# Patient Record
Sex: Female | Born: 1985 | Hispanic: No | Marital: Single | Smoking: Former smoker
Health system: Southern US, Community
[De-identification: ages and names within clinical notes are randomized; demographics above are authoritative.]

## PROBLEM LIST (undated history)

## (undated) DIAGNOSIS — D649 Anemia, unspecified: Secondary | ICD-10-CM

## (undated) DIAGNOSIS — E079 Disorder of thyroid, unspecified: Secondary | ICD-10-CM

## (undated) HISTORY — DX: Disorder of thyroid, unspecified: E07.9

---

## 2008-12-07 HISTORY — PX: BACK SURGERY: SHX140

## 2009-12-07 HISTORY — PX: WRIST SURGERY: SHX841

## 2013-11-08 ENCOUNTER — Ambulatory Visit: Payer: Medicaid Other | Attending: Internal Medicine | Admitting: Internal Medicine

## 2013-11-08 ENCOUNTER — Encounter: Payer: Self-pay | Admitting: Internal Medicine

## 2013-11-08 ENCOUNTER — Other Ambulatory Visit: Payer: Self-pay | Admitting: Internal Medicine

## 2013-11-08 VITALS — BP 113/79 | HR 78 | Temp 98.2°F | Resp 17 | Wt 218.0 lb

## 2013-11-08 DIAGNOSIS — N63 Unspecified lump in unspecified breast: Secondary | ICD-10-CM | POA: Insufficient documentation

## 2013-11-08 DIAGNOSIS — Z23 Encounter for immunization: Secondary | ICD-10-CM

## 2013-11-08 DIAGNOSIS — Z87898 Personal history of other specified conditions: Secondary | ICD-10-CM | POA: Insufficient documentation

## 2013-11-08 DIAGNOSIS — E039 Hypothyroidism, unspecified: Secondary | ICD-10-CM | POA: Insufficient documentation

## 2013-11-08 DIAGNOSIS — Z139 Encounter for screening, unspecified: Secondary | ICD-10-CM | POA: Insufficient documentation

## 2013-11-08 NOTE — Progress Notes (Signed)
Patient here to establish care States was taking medication for her thyroid  But has not taken anything since 10/04/13 Does not know the name of the medication States she feels a lump in each breast Mother passed away 10 years ago from breast cancer Needs referral for [facility]

## 2013-11-08 NOTE — Progress Notes (Signed)
MRN: [id number] Name: Deborah White  Sex: female Age: 28 y.o. DOB: 01-04-1986  Allergies: Review of patient's allergies indicates no known allergies.  Chief Complaint  Patient presents with  . Establish Care    HPI: Patient is 27 y.o. female who comes today for the first time to establish medical care, she has history of hypothyroidism, was taking medication for that does not remember the dose, also reported to have bilateral breast lump for the last 5 years denies any pain or increased in size requesting to repeat an ultrasound, last ultrasound? Was done one year ago, she denies any change in weight or appetite denies any nausea vomiting or any other significant symptoms.  Past Medical History  Diagnosis Date  . Thyroid disease     History reviewed. No pertinent past surgical history.    Medication List    Notice As of 11/08/2013 11:30 AM   You have not been prescribed any medications.      No orders of the defined types were placed in this encounter.     There is no immunization history on file for this patient.  History  Substance Use Topics  . Smoking status: Never Smoker   . Smokeless tobacco: Not on file  . Alcohol Use: No    Review of Systems  As noted in HPI  Filed Vitals:   11/08/13 1050  BP: 113/79  Pulse: 78  Temp: 98.2 F (36.8 C)  Resp: 17    Physical Exam  Physical Exam  Constitutional: No distress.  Eyes: EOM are normal. Pupils are equal, round, and reactive to light.  Cardiovascular: Normal rate and regular rhythm.   Pulmonary/Chest: Breath sounds normal. She has no wheezes. She has no rales.  Breast examination done in the presence of medical staff as chaperone, left breast palpable lump nontender no nipple discharge    CBC No results found for this basename: wbc,  rbc,  hgb,  hct,  plt,  mcv,  neutrabs,  lymphsabs,  monoabs,  eosabs,  basosabs    CMP  No results found for this basename: na,  k,  cl,  co2,  glucose,  bun,   creatinine,  calcium,  prot,  albumin,  ast,  alt,  alkphos,  bilitot,  gfrnonaa,  gfraa    No results found for this basename: chol,  tri,  ldl    No components found with this basename: hga1c    No results found for this basename: AST    Assessment and Plan  History of breast lump - Plan: US Breast Bilateral  Unspecified hypothyroidism - Plan: Will check TSH level   Screening - Plan: CBC with Differential, COMPLETE METABOLIC PANEL WITH GFR, Lipid panel, Vit D  25 hydroxy (rtn osteoporosis monitoring)  flu shot given today    Return in about 3 weeks (around 11/29/2013).  Doris Cheadle, MD

## 2013-11-09 LAB — CBC WITH DIFFERENTIAL/PLATELET
Basophils Absolute: 0 10*3/uL (ref 0.0–0.1)
Basophils Relative: 0 % (ref 0–1)
Eosinophils Absolute: 0.1 10*3/uL (ref 0.0–0.7)
Eosinophils Relative: 1 % (ref 0–5)
HCT: 34.7 % — ABNORMAL LOW (ref 36.0–46.0)
Hemoglobin: 11 g/dL — ABNORMAL LOW (ref 12.0–15.0)
Lymphocytes Relative: 24 % (ref 12–46)
Lymphs Abs: 2.4 10*3/uL (ref 0.7–4.0)
MCH: 22.3 pg — ABNORMAL LOW (ref 26.0–34.0)
MCHC: 31.7 g/dL (ref 30.0–36.0)
MCV: 70.4 fL — ABNORMAL LOW (ref 78.0–100.0)
Monocytes Absolute: 0.7 10*3/uL (ref 0.1–1.0)
Monocytes Relative: 7 % (ref 3–12)
Neutro Abs: 6.8 10*3/uL (ref 1.7–7.7)
Neutrophils Relative %: 68 % (ref 43–77)
Platelets: 332 10*3/uL (ref 150–400)
RBC: 4.93 MIL/uL (ref 3.87–5.11)
RDW: 15.8 % — ABNORMAL HIGH (ref 11.5–15.5)
WBC: 10 10*3/uL (ref 4.0–10.5)

## 2013-11-09 LAB — COMPLETE METABOLIC PANEL WITH GFR
ALT: 11 U/L (ref 0–35)
AST: 10 U/L (ref 0–37)
Albumin: 4.6 g/dL (ref 3.5–5.2)
Alkaline Phosphatase: 85 U/L (ref 39–117)
BUN: 14 mg/dL (ref 6–23)
CO2: 25 mEq/L (ref 19–32)
Calcium: 9.8 mg/dL (ref 8.4–10.5)
Chloride: 105 mEq/L (ref 96–112)
Creat: 0.61 mg/dL (ref 0.50–1.10)
GFR, Est African American: >89 mL/min
GFR, Est Non African American: >89 mL/min
Glucose, Bld: 94 mg/dL (ref 70–99)
Potassium: 3.9 mEq/L (ref 3.5–5.3)
Sodium: 136 mEq/L (ref 135–145)
Total Bilirubin: 0.3 mg/dL (ref 0.3–1.2)
Total Protein: 7.3 g/dL (ref 6.0–8.3)

## 2013-11-09 LAB — LIPID PANEL
Cholesterol: 174 mg/dL (ref 0–200)
HDL: 33 mg/dL — ABNORMAL LOW (ref >39)
LDL Cholesterol: 111 mg/dL — ABNORMAL HIGH (ref 0–99)
Total CHOL/HDL Ratio: 5.3 Ratio
Triglycerides: 148 mg/dL (ref <150)
VLDL: 30 mg/dL (ref 0–40)

## 2013-11-09 LAB — TSH: TSH: 2.856 u[IU]/mL (ref 0.350–4.500)

## 2013-11-09 LAB — VITAMIN D 25 HYDROXY (VIT D DEFICIENCY, FRACTURES): Vit D, 25-Hydroxy: 17 ng/mL — ABNORMAL LOW (ref 30–89)

## 2013-11-10 ENCOUNTER — Ambulatory Visit
Admission: RE | Admit: 2013-11-10 | Discharge: 2013-11-10 | Disposition: A | Discharge status: Home / Self Care | Payer: Medicaid Other | Source: Ambulatory Visit | Attending: Internal Medicine | Admitting: Internal Medicine

## 2013-11-10 DIAGNOSIS — N63 Unspecified lump in unspecified breast: Secondary | ICD-10-CM

## 2013-11-15 ENCOUNTER — Telehealth: Payer: Self-pay | Admitting: Emergency Medicine

## 2013-11-15 MED ORDER — VITAMIN D (ERGOCALCIFEROL) 1.25 MG (50000 UNIT) PO CAPS: 50000.0000 [IU] | ORAL_CAPSULE | ORAL | Status: DC

## 2013-11-15 NOTE — Addendum Note (Signed)
Addended by: Nonnie Done D on: 11/15/2013 11:47 AM   Modules accepted: Orders

## 2013-11-15 NOTE — Telephone Encounter (Signed)
Pt given results and instructions with new scripts

## 2013-11-15 NOTE — Telephone Encounter (Signed)
Message copied by Darlis Loan on Wed Nov 15, 2013 11:08 AM ------      Message from: Doris Cheadle      Created: Tue Nov 14, 2013 10:28 AM       Blood work reviewed, noticed low vitamin D, call patient advise to start ergocalciferol 50,000 units once a week for the duration of  12 weeks.      Also patient has some anemia advise to start over-the-counter iron supplements ------

## 2013-11-16 ENCOUNTER — Telehealth: Payer: Self-pay | Admitting: *Deleted

## 2013-11-16 NOTE — Telephone Encounter (Signed)
Called the pt told her about her lab results. Vitamin D was low and anemia. I told her that the medication for the vitamin D was ready to pick up at our pharmacy. Also that for the anemia she needed to pick up OTC iron supplements.

## 2013-11-29 ENCOUNTER — Encounter: Payer: Self-pay | Admitting: Internal Medicine

## 2013-11-29 ENCOUNTER — Ambulatory Visit: Payer: Medicaid Other | Attending: Internal Medicine | Admitting: Internal Medicine

## 2013-11-29 VITALS — BP 122/78 | HR 68 | Temp 98.3°F | Resp 16

## 2013-11-29 DIAGNOSIS — D649 Anemia, unspecified: Secondary | ICD-10-CM | POA: Insufficient documentation

## 2013-11-29 DIAGNOSIS — E785 Hyperlipidemia, unspecified: Secondary | ICD-10-CM | POA: Insufficient documentation

## 2013-11-29 DIAGNOSIS — N63 Unspecified lump in unspecified breast: Secondary | ICD-10-CM

## 2013-11-29 DIAGNOSIS — E559 Vitamin D deficiency, unspecified: Secondary | ICD-10-CM | POA: Insufficient documentation

## 2013-11-29 DIAGNOSIS — J069 Acute upper respiratory infection, unspecified: Secondary | ICD-10-CM

## 2013-11-29 DIAGNOSIS — Z09 Encounter for follow-up examination after completed treatment for conditions other than malignant neoplasm: Secondary | ICD-10-CM

## 2013-11-29 MED ORDER — AZITHROMYCIN 250 MG PO TABS: ORAL_TABLET | ORAL | Status: DC

## 2013-11-29 MED ORDER — PSEUDOEPHEDRINE-GUAIFENESIN ER 120-1200 MG PO TB12: 1.0000 | ORAL_TABLET | Freq: Two times a day (BID) | ORAL | Status: DC | PRN

## 2013-11-29 NOTE — Progress Notes (Signed)
Patient here for follow up-thyroid disease Complains of congestion, headache fever Feels tired Having some post nasal drip as well

## 2013-11-29 NOTE — Progress Notes (Signed)
MRN: [id number] Name: Deborah White  Sex: female Age: 27 y.o. DOB: 11-10-1986  Allergies: Review of patient's allergies indicates no known allergies.  Chief Complaint  Patient presents with  . Follow-up    HPI: Patient is 27 y.o. female who comes today for followup, blood work was reviewed with the patient has vitamin D deficiency and she has started taking vitamin D supplement, borderline hyperlipidemia patient is already following low-fat diet, she also reported to have recently fever or headache sore throat nasal congestion, she took over-the-counter medication, fever is resolved but she still has persistent symptoms of congestion denies any more cough.  Past Medical History  Diagnosis Date  . Thyroid disease     History reviewed. No pertinent past surgical history.    Medication List       This list is accurate as of: 11/29/13 11:11 AM.  Always use your most recent med list.               azithromycin 250 MG tablet  Commonly known as:  ZITHROMAX Z-PAK  Take as directed     Pseudoephedrine-Guaifenesin [phone] MG Tb12  Take 1 tablet by mouth 2 (two) times daily as needed (congestion).     Vitamin D (Ergocalciferol) 50000 UNITS Caps capsule  Commonly known as:  DRISDOL  Take 1 capsule (50,000 Units total) by mouth every 7 (seven) days.        Meds ordered this encounter  Medications  . azithromycin (ZITHROMAX Z-PAK) 250 MG tablet    Sig: Take as directed    Dispense:  6 each    Refill:  0  . Pseudoephedrine-Guaifenesin [phone] MG TB12    Sig: Take 1 tablet by mouth 2 (two) times daily as needed (congestion).    Dispense:  30 each    Refill:  0    Immunization History  Administered Date(s) Administered  . Influenza,inj,Quad PF,36+ Mos 11/08/2013    Family History  Problem Relation Age of Onset  . Cancer Mother   . Cancer Maternal Uncle     brain cancer   . Heart disease Maternal Grandmother     History  Substance Use Topics  . Smoking  status: Never Smoker   . Smokeless tobacco: Not on file  . Alcohol Use: No    Review of Systems  As noted in HPI  Filed Vitals:   11/29/13 1033  BP: 122/78  Pulse: 68  Temp: 98.3 F (36.8 C)  Resp: 16    Physical Exam  Physical Exam  Constitutional: No distress.  HENT:  Nasal congestion minimal sinus tenderness,   Some pharyngeal erythema no exudate  Eyes: EOM are normal. Pupils are equal, round, and reactive to light.  Cardiovascular: Normal rate and regular rhythm.   Pulmonary/Chest: Breath sounds normal. No respiratory distress. She has no wheezes. She has no rales.  Lymphadenopathy:    She has cervical adenopathy.    CBC    Component Value Date/Time   WBC 10.0 11/08/2013 1136   RBC 4.93 11/08/2013 1136   HGB 11.0* 11/08/2013 1136   HCT 34.7* 11/08/2013 1136   PLT 332 11/08/2013 1136   MCV 70.4* 11/08/2013 1136   LYMPHSABS 2.4 11/08/2013 1136   MONOABS 0.7 11/08/2013 1136   EOSABS 0.1 11/08/2013 1136   BASOSABS 0.0 11/08/2013 1136    CMP     Component Value Date/Time   NA 136 11/08/2013 1136   K 3.9 11/08/2013 1136   CL 105 11/08/2013 1136   CO2 25 11/08/2013  1136   GLUCOSE 94 11/08/2013 1136   BUN 14 11/08/2013 1136   CREATININE 0.61 11/08/2013 1136   CALCIUM 9.8 11/08/2013 1136   PROT 7.3 11/08/2013 1136   ALBUMIN 4.6 11/08/2013 1136   AST 10 11/08/2013 1136   ALT 11 11/08/2013 1136   ALKPHOS 85 11/08/2013 1136   BILITOT 0.3 11/08/2013 1136    Lab Results  Component Value Date/Time   CHOL 174 11/08/2013 11:36 AM    No components found with this basename: hga1c    Lab Results  Component Value Date/Time   AST 10 11/08/2013 11:36 AM    Assessment and Plan  Follow up  Unspecified vitamin D deficiency ... continue with vitamin D  Anemia ... continue with over-the-counter iron pills  Other and unspecified hyperlipidemia ... low fat diet  Breast lump in female ... had an ultrasound done repeat recommended in 6 months.  URI (upper respiratory infection) -  Plan: Advised patient for saltwater gargles, prescribed azithromycin (ZITHROMAX Z-PAK) 250 MG tablet, Pseudoephedrine-Guaifenesin [phone] MG TB12   Return in about 3 months (around 02/27/2014).  Doris Cheadle, MD

## 2014-02-27 ENCOUNTER — Ambulatory Visit: Payer: Medicaid Other | Admitting: Internal Medicine

## 2014-03-09 ENCOUNTER — Encounter: Payer: Self-pay | Admitting: Internal Medicine

## 2014-03-09 ENCOUNTER — Ambulatory Visit: Payer: Medicaid Other | Attending: Internal Medicine | Admitting: Internal Medicine

## 2014-03-09 ENCOUNTER — Telehealth: Payer: Self-pay

## 2014-03-09 VITALS — BP 133/78 | HR 82 | Temp 98.4°F | Resp 16 | Wt 205.1 lb

## 2014-03-09 DIAGNOSIS — D649 Anemia, unspecified: Secondary | ICD-10-CM | POA: Insufficient documentation

## 2014-03-09 DIAGNOSIS — Z09 Encounter for follow-up examination after completed treatment for conditions other than malignant neoplasm: Secondary | ICD-10-CM | POA: Insufficient documentation

## 2014-03-09 DIAGNOSIS — G4700 Insomnia, unspecified: Secondary | ICD-10-CM

## 2014-03-09 DIAGNOSIS — E559 Vitamin D deficiency, unspecified: Secondary | ICD-10-CM | POA: Insufficient documentation

## 2014-03-09 DIAGNOSIS — R109 Unspecified abdominal pain: Secondary | ICD-10-CM | POA: Insufficient documentation

## 2014-03-09 DIAGNOSIS — R1010 Upper abdominal pain, unspecified: Secondary | ICD-10-CM

## 2014-03-09 NOTE — Progress Notes (Signed)
Patient here with interpreter Complains of upper abd pain Denies any problem with voiding

## 2014-03-09 NOTE — Telephone Encounter (Signed)
This patient needs authorization for US Thank you

## 2014-03-09 NOTE — Progress Notes (Signed)
MRN: [id number] Name: Deborah White  Sex: female Age: 28 y.o. DOB: 11/27/1986  Allergies: Review of patient's allergies indicates no known allergies.  Chief Complaint  Patient presents with  . Follow-up    HPI: Patient is 28 y.o. female who comes today for followup, has history of anemia, vitamin D deficiency has been taking her medication but she has not taking iron supplement for the last 2 weeks, she reported to have upper abdominal pain and intermittent her ? Related to eating certain kind of greasy fried food, she denies any nausea vomiting change in bowel habits or denies any urinary symptoms. Patient also reported to have problem with the sleep.  Past Medical History  Diagnosis Date  . Thyroid disease     History reviewed. No pertinent past surgical history.    Medication List       This list is accurate as of: 03/09/14 12:12 PM.  Always use your most recent med list.               azithromycin 250 MG tablet  Commonly known as:  ZITHROMAX Z-PAK  Take as directed     Pseudoephedrine-Guaifenesin [phone] MG Tb12  Take 1 tablet by mouth 2 (two) times daily as needed (congestion).     Vitamin D (Ergocalciferol) 50000 UNITS Caps capsule  Commonly known as:  DRISDOL  Take 1 capsule (50,000 Units total) by mouth every 7 (seven) days.        No orders of the defined types were placed in this encounter.    Immunization History  Administered Date(s) Administered  . Influenza,inj,Quad PF,36+ Mos 11/08/2013    Family History  Problem Relation Age of Onset  . Cancer Mother   . Cancer Maternal Uncle     brain cancer   . Heart disease Maternal Grandmother     History  Substance Use Topics  . Smoking status: Never Smoker   . Smokeless tobacco: Not on file  . Alcohol Use: No    Review of Systems   As noted in HPI  Filed Vitals:   03/09/14 1150  BP: 133/78  Pulse: 82  Temp: 98.4 F (36.9 C)  Resp: 16    Physical Exam  Physical Exam    Constitutional: No distress.  Eyes: EOM are normal. Pupils are equal, round, and reactive to light.  Cardiovascular: Normal rate and regular rhythm.   Pulmonary/Chest: Breath sounds normal. No respiratory distress. She has no wheezes. She has no rales.  Abdominal:  Some right upper left upper quadrant and epigastric tenderness with deep palpation, no rebound or guarding bowel sounds positive    CBC    Component Value Date/Time   WBC 10.0 11/08/2013 1136   RBC 4.93 11/08/2013 1136   HGB 11.0* 11/08/2013 1136   HCT 34.7* 11/08/2013 1136   PLT 332 11/08/2013 1136   MCV 70.4* 11/08/2013 1136   LYMPHSABS 2.4 11/08/2013 1136   MONOABS 0.7 11/08/2013 1136   EOSABS 0.1 11/08/2013 1136   BASOSABS 0.0 11/08/2013 1136    CMP     Component Value Date/Time   NA 136 11/08/2013 1136   K 3.9 11/08/2013 1136   CL 105 11/08/2013 1136   CO2 25 11/08/2013 1136   GLUCOSE 94 11/08/2013 1136   BUN 14 11/08/2013 1136   CREATININE 0.61 11/08/2013 1136   CALCIUM 9.8 11/08/2013 1136   PROT 7.3 11/08/2013 1136   ALBUMIN 4.6 11/08/2013 1136   AST 10 11/08/2013 1136   ALT 11 11/08/2013  1136   ALKPHOS 85 11/08/2013 1136   BILITOT 0.3 11/08/2013 1136   GFRNONAA >89 11/08/2013 1136   GFRAA >89 11/08/2013 1136    Lab Results  Component Value Date/Time   CHOL 174 11/08/2013 11:36 AM    No components found with this basename: hga1c    Lab Results  Component Value Date/Time   AST 10 11/08/2013 11:36 AM    Assessment and Plan  Upper abdominal pain - Plan: US Abdomen Complete Advised patient to drink plenty of water after taking iron supplements, and avoid greasy food, we'll do an abdominal ultrasound.  Insomnia Advised patient for sleep hygiene. Patient will try over-the-counter melatonin.  Anemia Continue with iron supplements. Repeat CBC on the next visit.  Unspecified vitamin D deficiency Patient is completed the prescription dosage of vitamin D, she'll start taking over-the-counter vitamin D 2000 units  daily.    Return in about 4 months (around 07/09/2014), or if symptoms worsen or fail to improve, for anemia.  Doris Cheadle, MD

## 2014-03-09 NOTE — Telephone Encounter (Signed)
I will work on that  Today  If i get a chance or Monday

## 2014-03-15 ENCOUNTER — Telehealth: Payer: Self-pay

## 2014-03-15 ENCOUNTER — Ambulatory Visit (HOSPITAL_COMMUNITY)
Admission: RE | Admit: 2014-03-15 | Discharge: 2014-03-15 | Disposition: A | Discharge status: Home / Self Care | Payer: Medicaid Other | Source: Ambulatory Visit | Attending: Internal Medicine | Admitting: Internal Medicine

## 2014-03-15 DIAGNOSIS — R1010 Upper abdominal pain, unspecified: Secondary | ICD-10-CM

## 2014-03-15 DIAGNOSIS — R109 Unspecified abdominal pain: Secondary | ICD-10-CM | POA: Insufficient documentation

## 2014-03-15 NOTE — Telephone Encounter (Signed)
Message copied by Lestine MountJUAREZ, Jaeshaun Riva L on Thu Mar 15, 2014  2:14 PM ------      Message from: Doris CheadleADVANI, DEEPAK      Created: Thu Mar 15, 2014 11:47 AM       Call and let the patient know that her abdominal ultrasound is normal. ------

## 2014-03-15 NOTE — Telephone Encounter (Signed)
Left message on voice mail that her US was normal

## 2014-04-05 ENCOUNTER — Other Ambulatory Visit: Payer: Self-pay | Admitting: Internal Medicine

## 2014-04-05 DIAGNOSIS — N6320 Unspecified lump in the left breast, unspecified quadrant: Secondary | ICD-10-CM

## 2014-04-09 ENCOUNTER — Other Ambulatory Visit: Payer: Self-pay | Admitting: Internal Medicine

## 2014-04-09 ENCOUNTER — Other Ambulatory Visit: Payer: Self-pay

## 2014-04-09 DIAGNOSIS — N6320 Unspecified lump in the left breast, unspecified quadrant: Secondary | ICD-10-CM

## 2014-05-14 ENCOUNTER — Other Ambulatory Visit: Payer: Medicaid Other

## 2014-05-18 ENCOUNTER — Ambulatory Visit: Payer: Medicaid Other

## 2014-05-25 ENCOUNTER — Other Ambulatory Visit (HOSPITAL_COMMUNITY): Payer: Self-pay | Admitting: *Deleted

## 2014-05-25 DIAGNOSIS — N6320 Unspecified lump in the left breast, unspecified quadrant: Secondary | ICD-10-CM

## 2014-05-28 ENCOUNTER — Other Ambulatory Visit: Payer: Medicaid Other

## 2014-05-29 ENCOUNTER — Ambulatory Visit (HOSPITAL_COMMUNITY)
Admission: RE | Admit: 2014-05-29 | Discharge: 2014-05-29 | Disposition: A | Discharge status: Home / Self Care | Payer: Self-pay | Source: Ambulatory Visit | Attending: Obstetrics and Gynecology | Admitting: Obstetrics and Gynecology

## 2014-05-29 ENCOUNTER — Encounter (HOSPITAL_COMMUNITY): Payer: Self-pay

## 2014-05-29 VITALS — BP 110/64 | Temp 98.0°F | Ht 64.57 in | Wt 195.8 lb

## 2014-05-29 DIAGNOSIS — N63 Unspecified lump in unspecified breast: Secondary | ICD-10-CM

## 2014-05-29 DIAGNOSIS — Z01419 Encounter for gynecological examination (general) (routine) without abnormal findings: Secondary | ICD-10-CM

## 2014-05-29 HISTORY — DX: Anemia, unspecified: D64.9

## 2014-05-29 NOTE — Progress Notes (Signed)
[facility] due to recommeding 6 month follow-up left breast ultrasound. Bilateral breast ultrasound completed 11/10/2013.  Pap Smear:  Pap smear completed today. Per patient has never had a Pap smear. No Pap smear results in EPIC.  Physical exam: Breasts Breasts symmetrical. No skin abnormalities bilateral breasts. No nipple retraction bilateral breasts. No nipple discharge bilateral breasts. No lymphadenopathy. Palpated a small lump within the right breast at 11 o'clock 7 cm from the nipple. Palpated two moveable lumps within the left breast at 3 o'clock 5 cm from the nipple and 11 o'clock 3 cm from the nipple. No complaints of pain or tenderness on exam.[facility]. Appointment scheduled for Tuesday, June 05, 2014 at 0945.     Pelvic/Bimanual   Ext Genitalia No lesions, no swelling and no discharge observed on external genitalia.         Vagina Vagina pink and normal texture. No lesions or discharge observed in vagina.          Cervix Cervix is present. Cervix pink and of normal texture. No discharge observed.     Uterus Patient refused bimanual exam.   Adnexae Patient refused bimanual exam.       Rectovaginal No rectal exam completed today since patient had no rectal complaints. No skin abnormalities observed on exam.

## 2014-05-29 NOTE — Assessment & Plan Note (Signed)
[facility]. Appointment scheduled for Tuesday, June 05, 2014 at 0945.

## 2014-05-29 NOTE — Patient Instructions (Signed)
Taught Deborah White how to perform BSE and gave educational materials to take home. Let her know [facility] will cover Pap smears every 3 years unless has a history of abnormal Pap smears. [facility] breast [facility]. Appointment scheduled for Tuesday, June 05, 2014 at 0945. Patient aware of appointment and will be there. Let patient know will follow up with her within the next couple weeks with results of Pap smear by phone. Crystalynn White verbalized understanding.  Karole Oo, Kathaleen Maserhristine Poll, RN 10:10 AM

## 2014-05-31 ENCOUNTER — Telehealth (HOSPITAL_COMMUNITY): Payer: Self-pay | Admitting: *Deleted

## 2014-05-31 LAB — CYTOLOGY - PAP

## 2014-05-31 NOTE — Telephone Encounter (Signed)
Telephoned patient at home # and discussed normal pap smear results. Next pap due in 3 years. Patient voiced understanding.  

## 2014-06-05 ENCOUNTER — Other Ambulatory Visit: Payer: Self-pay

## 2014-06-07 ENCOUNTER — Ambulatory Visit
Admission: RE | Admit: 2014-06-07 | Discharge: 2014-06-07 | Disposition: A | Discharge status: Home / Self Care | Payer: No Typology Code available for payment source | Source: Ambulatory Visit | Attending: Obstetrics and Gynecology | Admitting: Obstetrics and Gynecology

## 2014-06-07 DIAGNOSIS — N6320 Unspecified lump in the left breast, unspecified quadrant: Secondary | ICD-10-CM

## 2014-11-26 ENCOUNTER — Other Ambulatory Visit: Payer: Self-pay

## 2014-11-26 DIAGNOSIS — N63 Unspecified lump in unspecified breast: Secondary | ICD-10-CM

## 2015-01-01 ENCOUNTER — Other Ambulatory Visit: Payer: Self-pay | Admitting: Internal Medicine

## 2015-01-01 DIAGNOSIS — N63 Unspecified lump in unspecified breast: Secondary | ICD-10-CM

## 2015-01-11 ENCOUNTER — Ambulatory Visit
Admission: RE | Admit: 2015-01-11 | Discharge: 2015-01-11 | Disposition: A | Discharge status: Home / Self Care | Payer: No Typology Code available for payment source | Source: Ambulatory Visit | Attending: Internal Medicine | Admitting: Internal Medicine

## 2015-01-11 DIAGNOSIS — N63 Unspecified lump in unspecified breast: Secondary | ICD-10-CM

## 2015-01-21 ENCOUNTER — Ambulatory Visit: Payer: Medicaid Other | Admitting: Internal Medicine

## 2015-01-30 ENCOUNTER — Ambulatory Visit: Payer: Medicaid Other | Admitting: Internal Medicine

## 2015-06-11 ENCOUNTER — Ambulatory Visit (INDEPENDENT_AMBULATORY_CARE_PROVIDER_SITE_OTHER): Payer: BLUE CROSS/BLUE SHIELD | Admitting: Family Medicine

## 2015-06-11 VITALS — BP 118/76 | HR 88 | Temp 98.4°F | Resp 18 | Ht 64.25 in | Wt 207.2 lb

## 2015-06-11 DIAGNOSIS — B354 Tinea corporis: Secondary | ICD-10-CM

## 2015-06-11 DIAGNOSIS — G5602 Carpal tunnel syndrome, left upper limb: Secondary | ICD-10-CM

## 2015-06-11 DIAGNOSIS — Z87898 Personal history of other specified conditions: Secondary | ICD-10-CM

## 2015-06-11 DIAGNOSIS — E039 Hypothyroidism, unspecified: Secondary | ICD-10-CM

## 2015-06-11 DIAGNOSIS — G5601 Carpal tunnel syndrome, right upper limb: Secondary | ICD-10-CM | POA: Diagnosis not present

## 2015-06-11 DIAGNOSIS — D500 Iron deficiency anemia secondary to blood loss (chronic): Secondary | ICD-10-CM | POA: Diagnosis not present

## 2015-06-11 DIAGNOSIS — L679 Hair color and hair shaft abnormality, unspecified: Secondary | ICD-10-CM

## 2015-06-11 DIAGNOSIS — N946 Dysmenorrhea, unspecified: Secondary | ICD-10-CM | POA: Diagnosis not present

## 2015-06-11 DIAGNOSIS — H9192 Unspecified hearing loss, left ear: Secondary | ICD-10-CM

## 2015-06-11 DIAGNOSIS — G5603 Carpal tunnel syndrome, bilateral upper limbs: Secondary | ICD-10-CM

## 2015-06-11 DIAGNOSIS — R42 Dizziness and giddiness: Secondary | ICD-10-CM | POA: Diagnosis not present

## 2015-06-11 DIAGNOSIS — E559 Vitamin D deficiency, unspecified: Secondary | ICD-10-CM

## 2015-06-11 DIAGNOSIS — R351 Nocturia: Secondary | ICD-10-CM

## 2015-06-11 DIAGNOSIS — I951 Orthostatic hypotension: Secondary | ICD-10-CM

## 2015-06-11 DIAGNOSIS — H8110 Benign paroxysmal vertigo, unspecified ear: Secondary | ICD-10-CM | POA: Diagnosis not present

## 2015-06-11 DIAGNOSIS — R234 Changes in skin texture: Secondary | ICD-10-CM

## 2015-06-11 DIAGNOSIS — Z0000 Encounter for general adult medical examination without abnormal findings: Secondary | ICD-10-CM | POA: Diagnosis not present

## 2015-06-11 LAB — THYROID PANEL WITH TSH
Free Thyroxine Index: 2 (ref 1.4–3.8)
T3 Uptake: 32 % (ref 22–35)
T4, Total: 6.1 ug/dL (ref 4.5–12.0)
TSH: 2.407 u[IU]/mL (ref 0.350–4.500)

## 2015-06-11 LAB — COMPREHENSIVE METABOLIC PANEL
ALT: 15 U/L (ref 0–35)
AST: 13 U/L (ref 0–37)
Albumin: 4.5 g/dL (ref 3.5–5.2)
Alkaline Phosphatase: 57 U/L (ref 39–117)
BUN: 10 mg/dL (ref 6–23)
CO2: 25 mEq/L (ref 19–32)
Calcium: 9.3 mg/dL (ref 8.4–10.5)
Chloride: 105 mEq/L (ref 96–112)
Creat: 0.56 mg/dL (ref 0.50–1.10)
Glucose, Bld: 96 mg/dL (ref 70–99)
Potassium: 4.4 mEq/L (ref 3.5–5.3)
Sodium: 138 mEq/L (ref 135–145)
Total Bilirubin: 0.4 mg/dL (ref 0.2–1.2)
Total Protein: 6.9 g/dL (ref 6.0–8.3)

## 2015-06-11 LAB — POCT URINALYSIS DIPSTICK
Bilirubin, UA: NEGATIVE
Blood, UA: NEGATIVE
Glucose, UA: NEGATIVE
Ketones, UA: NEGATIVE
Leukocytes, UA: NEGATIVE
Nitrite, UA: NEGATIVE
Protein, UA: NEGATIVE
Spec Grav, UA: 1.015
Urobilinogen, UA: 0.2
pH, UA: 7

## 2015-06-11 LAB — POCT CBC
Granulocyte percent: 69.7 %G (ref 37–80)
HCT, POC: 41.9 % (ref 37.7–47.9)
Hemoglobin: 13.2 g/dL (ref 12.2–16.2)
Lymph, poc: 1.9 (ref 0.6–3.4)
MCH, POC: 25.9 pg — AB (ref 27–31.2)
MCHC: 31.5 g/dL — AB (ref 31.8–35.4)
MCV: 82.2 fL (ref 80–97)
MID (cbc): 0.3 (ref 0–0.9)
MPV: 9.1 fL (ref 0–99.8)
POC Granulocyte: 5 (ref 2–6.9)
POC LYMPH PERCENT: 26.3 %L (ref 10–50)
POC MID %: 4 %M (ref 0–12)
Platelet Count, POC: 247 10*3/uL (ref 142–424)
RBC: 5.1 M/uL (ref 4.04–5.48)
RDW, POC: 13.7 %
WBC: 7.2 10*3/uL (ref 4.6–10.2)

## 2015-06-11 LAB — VITAMIN B12: Vitamin B-12: 339 pg/mL (ref 211–911)

## 2015-06-11 LAB — IBC PANEL
%SAT: 24 % (ref 20–55)
TIBC: 310 ug/dL (ref 250–470)
UIBC: 235 ug/dL (ref 125–400)

## 2015-06-11 LAB — IRON: Iron: 75 ug/dL (ref 42–145)

## 2015-06-11 LAB — POCT UA - MICROSCOPIC ONLY
Casts, Ur, LPF, POC: NEGATIVE
Crystals, Ur, HPF, POC: NEGATIVE
Mucus, UA: NEGATIVE
WBC, Ur, HPF, POC: NEGATIVE
Yeast, UA: NEGATIVE

## 2015-06-11 LAB — POCT GLYCOSYLATED HEMOGLOBIN (HGB A1C): Hemoglobin A1C: 5.2

## 2015-06-11 LAB — FERRITIN: Ferritin: 45 ng/mL (ref 10–291)

## 2015-06-11 MED ORDER — CLOTRIMAZOLE 1 % EX CREA: 1.0000 "application " | TOPICAL_CREAM | Freq: Two times a day (BID) | CUTANEOUS | Status: DC
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
## 2015-06-11 NOTE — Patient Instructions (Addendum)
Increase water, salt, and protein in your diet. Orthostatic Hypotension Orthostatic hypotension is a sudden drop in blood pressure. It happens when you quickly stand up from a seated or lying position. You may feel dizzy or light-headed. This can last for just a few seconds or for up to a few minutes. It is usually not a serious problem. However, if this happens frequently or gets worse, it can be a sign of something more serious. CAUSES  Different things can cause orthostatic hypotension, including:   Loss of body fluids (dehydration).  Medicines that lower blood pressure.  Sudden changes in posture, such as standing up quickly after you have been sitting or lying down.  Taking too much of your medicine. SIGNS AND SYMPTOMS   Light-headedness or dizziness.   Fainting or near-fainting.   A fast heart rate.   Weakness.   Feeling tired (fatigue).  DIAGNOSIS  Your health care provider may do several things to help diagnose your condition and identify the cause. These may include:   Taking a medical history and doing a physical exam.  Checking your blood pressure. Your health care provider will check your blood pressure when you are:  Lying down.  Sitting.  Standing.  Using tilt table testing. In this test, you lie down on a table that moves from a lying position to a standing position. You will be strapped onto the table. This test monitors your blood pressure and heart rate when you are in different positions. TREATMENT  Treatment will vary depending on the cause. Possible treatments include:   Changing the dosage of your medicines.  Wearing compression stockings on your lower legs.  Standing up slowly after sitting or lying down.  Eating more salt.  Eating frequent, small meals.  In some cases, getting IV fluids.  Taking medicine to enhance fluid retention. HOME CARE INSTRUCTIONS  Only take over-the-counter or prescription medicines as directed by your  health care provider.  Follow your health care provider's instructions for changing the dosage of your current medicines.  Do not stop or adjust your medicine on your own.  Stand up slowly after sitting or lying down. This allows your body to adjust to the different position.  Wear compression stockings as directed.  Eat extra salt as directed.  Do not add extra salt to your diet unless directed to by your health care provider.  Eat frequent, small meals.  Avoid standing suddenly after eating.  Avoid hot showers or excessive heat as directed by your health care provider.  Keep all follow-up appointments. SEEK MEDICAL CARE IF:  You continue to feel dizzy or light-headed after standing.  You feel groggy or confused.  You feel cold, clammy, or sick to your stomach (nauseous).  You have blurred vision.  You feel short of breath. SEEK IMMEDIATE MEDICAL CARE IF:   You faint after standing.  You have chest pain.  You have difficulty breathing.   You lose feeling or movement in your arms or legs.   You have slurred speech or difficulty talking, or you are unable to talk.  MAKE SURE YOU:   Understand these instructions.  Will watch your condition.  Will get help right away if you are not doing well or get worse. Document Released: 11/13/2002 Document Revised: 11/28/2013 Document Reviewed: 09/15/2013 [facility] Patient Information 2015 [place], [place]. This information is not intended to replace advice given to you by your health care provider. Make sure you discuss any questions you have with your health care provider.  Polycystic Ovarian Syndrome Polycystic ovarian syndrome (PCOS) is a common hormonal disorder among women of reproductive age. Most women with PCOS grow many small cysts on their ovaries. PCOS can cause problems with your periods and make it difficult to get pregnant. It can also cause an increased risk of miscarriage with pregnancy. If left untreated,  PCOS can lead to serious health problems, such as diabetes and heart disease. CAUSES The cause of PCOS is not fully understood, but genetics may be a factor. SIGNS AND SYMPTOMS   Infrequent or no menstrual periods.   Inability to get pregnant (infertility) because of not ovulating.   Increased growth of hair on the face, chest, stomach, back, thumbs, thighs, or toes.   Acne, oily skin, or dandruff.   Pelvic pain.   Weight gain or obesity, usually carrying extra weight around the waist.   Type 2 diabetes.   High cholesterol.   High blood pressure.   Female-pattern baldness or thinning hair.   Patches of thickened and dark brown or black skin on the neck, arms, breasts, or thighs.   Tiny excess flaps of skin (skin tags) in the armpits or neck area.   Excessive snoring and having breathing stop at times while asleep (sleep apnea).   Deepening of the voice.   Gestational diabetes when pregnant.  DIAGNOSIS  There is no single test to diagnose PCOS.   Your health care provider will:   Take a medical history.   Perform a pelvic exam.   Have ultrasonography done.   Check your female and female hormone levels.   Measure glucose or sugar levels in the blood.   Do other blood tests.   If you are producing too many female hormones, your health care provider will make sure it is from PCOS. At the physical exam, your health care provider will want to evaluate the areas of increased hair growth. Try to allow natural hair growth for a few days before the visit.   During a pelvic exam, the ovaries may be enlarged or swollen because of the increased number of small cysts. This can be seen more easily by using vaginal ultrasonography or screening to examine the ovaries and lining of the uterus (endometrium) for cysts. The uterine lining may become thicker if you have not been having a regular period.  TREATMENT  Because there is no cure for PCOS, it needs to  be managed to prevent problems. Treatments are based on your symptoms. Treatment is also based on whether you want to have a baby or whether you need contraception.  Treatment may include:   Progesterone hormone to start a menstrual period.   Birth control pills to make you have regular menstrual periods.   Medicines to make you ovulate, if you want to get pregnant.   Medicines to control your insulin.   Medicine to control your blood pressure.   Medicine and diet to control your high cholesterol and triglycerides in your blood.  Medicine to reduce excessive hair growth.  Surgery, making small holes in the ovary, to decrease the amount of female hormone production. This is done through a long, lighted tube (laparoscope) placed into the pelvis through a tiny incision in the lower abdomen.  HOME CARE INSTRUCTIONS  Only take over-the-counter or prescription medicine as directed by your health care provider.  Pay attention to the foods you eat and your activity levels. This can help reduce the effects of PCOS.  Keep your weight under control.  Eat foods that  are low in carbohydrate and high in fiber.  Exercise regularly. SEEK MEDICAL CARE IF:  Your symptoms do not get better with medicine.  You have new symptoms. Document Released: 03/19/2005 Document Revised: 09/13/2013 Document Reviewed: 05/11/2013 [facility] Patient Information 2015 [place], [place]. This information is not intended to replace advice given to you by your health care provider. Make sure you discuss any questions you have with your health care provider.  Vertigo Vertigo means you feel like you or your surroundings are moving when they are not. Vertigo can be dangerous if it occurs when you are at work, driving, or performing difficult activities.  CAUSES  Vertigo occurs when there is a conflict of signals sent to your brain from the visual and sensory systems in your body. There are many different causes of  vertigo, including:  Infections, especially in the inner ear.  A bad reaction to a drug or misuse of alcohol and medicines.  Withdrawal from drugs or alcohol.  Rapidly changing positions, such as lying down or rolling over in bed.  A migraine headache.  Decreased blood flow to the brain.  Increased pressure in the brain from a head injury, infection, tumor, or bleeding. SYMPTOMS  You may feel as though the world is spinning around or you are falling to the ground. Because your balance is upset, vertigo can cause nausea and vomiting. You may have involuntary eye movements (nystagmus). DIAGNOSIS  Vertigo is usually diagnosed by physical exam. If the cause of your vertigo is unknown, your caregiver may perform imaging tests, such as an MRI scan (magnetic resonance imaging). TREATMENT  Most cases of vertigo resolve on their own, without treatment. Depending on the cause, your caregiver may prescribe certain medicines. If your vertigo is related to body position issues, your caregiver may recommend movements or procedures to correct the problem. In rare cases, if your vertigo is caused by certain inner ear problems, you may need surgery. HOME CARE INSTRUCTIONS   Follow your caregiver's instructions.  Avoid driving.  Avoid operating heavy machinery.  Avoid performing any tasks that would be dangerous to you or others during a vertigo episode.  Tell your caregiver if you notice that certain medicines seem to be causing your vertigo. Some of the medicines used to treat vertigo episodes can actually make them worse in some people. SEEK IMMEDIATE MEDICAL CARE IF:   Your medicines do not relieve your vertigo or are making it worse.  You develop problems with talking, walking, weakness, or using your arms, hands, or legs.  You develop severe headaches.  Your nausea or vomiting continues or gets worse.  You develop visual changes.  A family member notices behavioral changes.  Your  condition gets worse. MAKE SURE YOU:  Understand these instructions.  Will watch your condition.  Will get help right away if you are not doing well or get worse. Document Released: 09/02/2005 Document Revised: 02/15/2012 Document Reviewed: 06/11/2011 [facility] Patient Information 2015 [place], [place]. This information is not intended to replace advice given to you by your health care provider. Make sure you discuss any questions you have with your health care provider.  Body Ringworm Ringworm (tinea corporis) is a fungal infection of the skin on the body. This infection is not caused by worms, but is actually caused by a fungus. Fungus normally lives on the top of your skin and can be useful. However, in the case of ringworms, the fungus grows out of control and causes a skin infection. It can involve any  area of skin on the body and can spread easily from one person to another (contagious). Ringworm is a common problem for children, but it can affect adults as well. Ringworm is also often found in athletes, especially wrestlers who share equipment and mats.  CAUSES  Ringworm of the body is caused by a fungus called dermatophyte. It can spread by:  Touchingother people who are infected.  Touchinginfected pets.  Touching or sharingobjects that have been in contact with the infected person or pet (hats, combs, towels, clothing, sports equipment). SYMPTOMS   Itchy, raised red spots and bumps on the skin.  Ring-shaped rash.  Redness near the border of the rash with a clear center.  Dry and scaly skin on or around the rash. Not every person develops a ring-shaped rash. Some develop only the red, scaly patches. DIAGNOSIS  Most often, ringworm can be diagnosed by performing a skin exam. Your caregiver may choose to take a skin scraping from the affected area. The sample will be examined under the microscope to see if the fungus is present.  TREATMENT  Body ringworm may be treated with a  topical antifungal cream or ointment. Sometimes, an antifungal shampoo that can be used on your body is prescribed. You may be prescribed antifungal medicines to take by mouth if your ringworm is severe, keeps coming back, or lasts a long time.  HOME CARE INSTRUCTIONS   Only take over-the-counter or prescription medicines as directed by your caregiver.  Wash the infected area and dry it completely before applying yourcream or ointment.  When using antifungal shampoo to treat the ringworm, leave the shampoo on the body for 3-5 minutes before rinsing.   Wear loose clothing to stop clothes from rubbing and irritating the rash.  Wash or change your bed sheets every night while you have the rash.  Have your pet treated by your veterinarian if it has the same infection. To prevent ringworm:   Practice good hygiene.  Wear sandals or shoes in public places and showers.  Do not share personal items with others.  Avoid touching red patches of skin on other people.  Avoid touching pets that have bald spots or wash your hands after doing so. SEEK MEDICAL CARE IF:   Your rash continues to spread after 7 days of treatment.  Your rash is not gone in 4 weeks.  The area around your rash becomes red, warm, tender, and swollen. Document Released: 11/20/2000 Document Revised: 08/17/2012 Document Reviewed: 06/06/2012 [facility] Patient Information 2015 [place], [place]. This information is not intended to replace advice given to you by your health care provider. Make sure you discuss any questions you have with your health care provider.  Health Maintenance Adopting a healthy lifestyle and getting preventive care can go a long way to promote health and wellness. Talk with your health care provider about what schedule of regular examinations is right for you. This is a good chance for you to check in with your provider about disease prevention and staying healthy. In between checkups, there are plenty  of things you can do on your own. Experts have done a lot of research about which lifestyle changes and preventive measures are most likely to keep you healthy. Ask your health care provider for more information. WEIGHT AND DIET  Eat a healthy diet  Be sure to include plenty of vegetables, fruits, low-fat dairy products, and lean protein.  Do not eat a lot of foods high in solid fats, added sugars, or salt.  Get regular exercise. This is one of the most important things you can do for your health.  Most adults should exercise for at least 150 minutes each week. The exercise should increase your heart rate and make you sweat (moderate-intensity exercise).  Most adults should also do strengthening exercises at least twice a week. This is in addition to the moderate-intensity exercise.  Maintain a healthy weight  Body mass index (BMI) is a measurement that can be used to identify possible weight problems. It estimates body fat based on height and weight. Your health care provider can help determine your BMI and help you achieve or maintain a healthy weight.  For females 74 years of age and older:   A BMI below 18.5 is considered underweight.  A BMI of 18.5 to 24.9 is normal.  A BMI of 25 to 29.9 is considered overweight.  A BMI of 30 and above is considered obese.  Watch levels of cholesterol and blood lipids  You should start having your blood tested for lipids and cholesterol at 29 years of age, then have this test every 5 years.  You may need to have your cholesterol levels checked more often if:  Your lipid or cholesterol levels are high.  You are older than 29 years of age.  You are at high risk for heart disease.  CANCER SCREENING   Lung Cancer  Lung cancer screening is recommended for adults 21-62 years old who are at high risk for lung cancer because of a history of smoking.  A yearly low-dose CT scan of the lungs is recommended for people who:  Currently  smoke.  Have quit within the past 15 years.  Have at least a 30-pack-year history of smoking. A pack year is smoking an average of one pack of cigarettes a day for 1 year.  Yearly screening should continue until it has been 15 years since you quit.  Yearly screening should stop if you develop a health problem that would prevent you from having lung cancer treatment.  Breast Cancer  Practice breast self-awareness. This means understanding how your breasts normally appear and feel.  It also means doing regular breast self-exams. Let your health care provider know about any changes, no matter how small.  If you are in your 20s or 30s, you should have a clinical breast exam (CBE) by a health care provider every 1-3 years as part of a regular health exam.  If you are 51 or older, have a CBE every year. Also consider having a breast X-ray (mammogram) every year.  If you have a family history of breast cancer, talk to your health care provider about genetic screening.  If you are at high risk for breast cancer, talk to your health care provider about having an MRI and a mammogram every year.  Breast cancer gene (BRCA) assessment is recommended for women who have family members with BRCA-related cancers. BRCA-related cancers include:  Breast.  Ovarian.  Tubal.  Peritoneal cancers.  Results of the assessment will determine the need for genetic counseling and BRCA1 and BRCA2 testing. Cervical Cancer Routine pelvic examinations to screen for cervical cancer are no longer recommended for nonpregnant women who are considered low risk for cancer of the pelvic organs (ovaries, uterus, and vagina) and who do not have symptoms. A pelvic examination may be necessary if you have symptoms including those associated with pelvic infections. Ask your health care provider if a screening pelvic exam is right for you.   The  Pap test is the screening test for cervical cancer for women who are considered  at risk.  If you had a hysterectomy for a problem that was not cancer or a condition that could lead to cancer, then you no longer need Pap tests.  If you are older than 65 years, and you have had normal Pap tests for the past 10 years, you no longer need to have Pap tests.  If you have had past treatment for cervical cancer or a condition that could lead to cancer, you need Pap tests and screening for cancer for at least 20 years after your treatment.  If you no longer get a Pap test, assess your risk factors if they change (such as having a new sexual partner). This can affect whether you should start being screened again.  Some women have medical problems that increase their chance of getting cervical cancer. If this is the case for you, your health care provider may recommend more frequent screening and Pap tests.  The human papillomavirus (HPV) test is another test that may be used for cervical cancer screening. The HPV test looks for the virus that can cause cell changes in the cervix. The cells collected during the Pap test can be tested for HPV.  The HPV test can be used to screen women 53 years of age and older. Getting tested for HPV can extend the interval between normal Pap tests from three to five years.  An HPV test also should be used to screen women of any age who have unclear Pap test results.  After 29 years of age, women should have HPV testing as often as Pap tests.  Colorectal Cancer  This type of cancer can be detected and often prevented.  Routine colorectal cancer screening usually begins at 29 years of age and continues through 29 years of age.  Your health care provider may recommend screening at an earlier age if you have risk factors for colon cancer.  Your health care provider may also recommend using home test kits to check for hidden blood in the stool.  A small camera at the end of a tube can be used to examine your colon directly (sigmoidoscopy or  colonoscopy). This is done to check for the earliest forms of colorectal cancer.  Routine screening usually begins at age 34.  Direct examination of the colon should be repeated every 5-10 years through 29 years of age. However, you may need to be screened more often if early forms of precancerous polyps or small growths are found. Skin Cancer  Check your skin from head to toe regularly.  Tell your health care provider about any new moles or changes in moles, especially if there is a change in a mole's shape or color.  Also tell your health care provider if you have a mole that is larger than the size of a pencil eraser.  Always use sunscreen. Apply sunscreen liberally and repeatedly throughout the day.  Protect yourself by wearing long sleeves, pants, a wide-brimmed hat, and sunglasses whenever you are outside. HEART DISEASE, DIABETES, AND HIGH BLOOD PRESSURE   Have your blood pressure checked at least every 1-2 years. High blood pressure causes heart disease and increases the risk of stroke.  If you are between 75 years and 44 years old, ask your health care provider if you should take aspirin to prevent strokes.  Have regular diabetes screenings. This involves taking a blood sample to check your fasting blood sugar level.  If you are at a normal weight and have a low risk for diabetes, have this test once every three years after 29 years of age.  If you are overweight and have a high risk for diabetes, consider being tested at a younger age or more often. PREVENTING INFECTION  Hepatitis B  If you have a higher risk for hepatitis B, you should be screened for this virus. You are considered at high risk for hepatitis B if:  You were born in a country where hepatitis B is common. Ask your health care provider which countries are considered high risk.  Your parents were born in a high-risk country, and you have not been immunized against hepatitis B (hepatitis B vaccine).  You have  HIV or AIDS.  You use needles to inject street drugs.  You live with someone who has hepatitis B.  You have had sex with someone who has hepatitis B.  You get hemodialysis treatment.  You take certain medicines for conditions, including cancer, organ transplantation, and autoimmune conditions. Hepatitis C  Blood testing is recommended for:  Everyone born from [birth year] through 1965.  Anyone with known risk factors for hepatitis C. Sexually transmitted infections (STIs)  You should be screened for sexually transmitted infections (STIs) including gonorrhea and chlamydia if:  You are sexually active and are younger than 29 years of age.  You are older than 29 years of age and your health care provider tells you that you are at risk for this type of infection.  Your sexual activity has changed since you were last screened and you are at an increased risk for chlamydia or gonorrhea. Ask your health care provider if you are at risk.  If you do not have HIV, but are at risk, it may be recommended that you take a prescription medicine daily to prevent HIV infection. This is called pre-exposure prophylaxis (PrEP). You are considered at risk if:  You are sexually active and do not regularly use condoms or know the HIV status of your partner(s).  You take drugs by injection.  You are sexually active with a partner who has HIV. Talk with your health care provider about whether you are at high risk of being infected with HIV. If you choose to begin PrEP, you should first be tested for HIV. You should then be tested every 3 months for as long as you are taking PrEP.  PREGNANCY   If you are premenopausal and you may become pregnant, ask your health care provider about preconception counseling.  If you may become pregnant, take 400 to 800 micrograms (mcg) of folic acid every day.  If you want to prevent pregnancy, talk to your health care provider about birth control  (contraception). OSTEOPOROSIS AND MENOPAUSE   Osteoporosis is a disease in which the bones lose minerals and strength with aging. This can result in serious bone fractures. Your risk for osteoporosis can be identified using a bone density scan.  If you are 59 years of age or older, or if you are at risk for osteoporosis and fractures, ask your health care provider if you should be screened.  Ask your health care provider whether you should take a calcium or vitamin D supplement to lower your risk for osteoporosis.  Menopause may have certain physical symptoms and risks.  Hormone replacement therapy may reduce some of these symptoms and risks. Talk to your health care provider about whether hormone replacement therapy is right for you.  HOME CARE INSTRUCTIONS  Schedule regular health, dental, and eye exams.  Stay current with your immunizations.   Do not use any tobacco products including cigarettes, chewing tobacco, or electronic cigarettes.  If you are pregnant, do not drink alcohol.  If you are breastfeeding, limit how much and how often you drink alcohol.  Limit alcohol intake to no more than 1 drink per day for nonpregnant women. One drink equals 12 ounces of beer, 5 ounces of wine, or 1 ounces of hard liquor.  Do not use street drugs.  Do not share needles.  Ask your health care provider for help if you need support or information about quitting drugs.  Tell your health care provider if you often feel depressed.  Tell your health care provider if you have ever been abused or do not feel safe at home. Document Released: 06/08/2011 Document Revised: 04/09/2014 Document Reviewed: 10/25/2013 [facility] Patient Information 2015 [place], [place]. This information is not intended to replace advice given to you by your health care provider. Make sure you discuss any questions you have with your health care provider.

## 2015-06-12 ENCOUNTER — Encounter: Payer: Self-pay | Admitting: Family Medicine

## 2015-06-12 ENCOUNTER — Encounter: Payer: Self-pay | Admitting: Physician Assistant

## 2015-06-12 LAB — VITAMIN D 25 HYDROXY (VIT D DEFICIENCY, FRACTURES): Vit D, 25-Hydroxy: 13 ng/mL — ABNORMAL LOW (ref 30–100)

## 2015-06-12 MED ORDER — VITAMIN D (ERGOCALCIFEROL) 1.25 MG (50000 UNIT) PO CAPS: 50000.0000 [IU] | ORAL_CAPSULE | ORAL | Status: DC

## 2015-06-25 ENCOUNTER — Telehealth: Payer: Self-pay

## 2015-06-25 MED ORDER — CLOTRIMAZOLE 1 % EX CREA: 1.0000 "application " | TOPICAL_CREAM | Freq: Two times a day (BID) | CUTANEOUS | Status: DC

## 2015-06-25 NOTE — Telephone Encounter (Signed)
Rx sent.  Pt notified. 

## 2015-06-25 NOTE — Telephone Encounter (Signed)
Patient was prescribed a cream and the pharmacy it was sent to doesn't take her insurance. She stated that she was told they will not transfer without the providers approval. [facility] [address]

## 2015-08-04 ENCOUNTER — Telehealth: Payer: Self-pay | Admitting: Family Medicine

## 2015-08-04 NOTE — Telephone Encounter (Signed)
Patient want me to fax over her labs to Dr Jacob Moores i sent labs over

## 2015-08-07 ENCOUNTER — Encounter: Payer: Self-pay | Admitting: *Deleted

## 2015-08-09 ENCOUNTER — Ambulatory Visit (INDEPENDENT_AMBULATORY_CARE_PROVIDER_SITE_OTHER): Payer: BLUE CROSS/BLUE SHIELD | Admitting: Family Medicine

## 2015-08-09 VITALS — BP 110/76 | HR 81 | Temp 97.9°F | Resp 16 | Ht 64.25 in | Wt 214.0 lb

## 2015-08-09 DIAGNOSIS — E559 Vitamin D deficiency, unspecified: Secondary | ICD-10-CM

## 2015-08-09 DIAGNOSIS — G5601 Carpal tunnel syndrome, right upper limb: Secondary | ICD-10-CM | POA: Diagnosis not present

## 2015-08-09 DIAGNOSIS — G5602 Carpal tunnel syndrome, left upper limb: Secondary | ICD-10-CM | POA: Diagnosis not present

## 2015-08-09 DIAGNOSIS — M67431 Ganglion, right wrist: Secondary | ICD-10-CM | POA: Diagnosis not present

## 2015-08-09 DIAGNOSIS — M67432 Ganglion, left wrist: Secondary | ICD-10-CM | POA: Diagnosis not present

## 2015-08-09 DIAGNOSIS — E669 Obesity, unspecified: Secondary | ICD-10-CM | POA: Diagnosis not present

## 2015-08-09 DIAGNOSIS — G5603 Carpal tunnel syndrome, bilateral upper limbs: Secondary | ICD-10-CM

## 2015-08-09 MED ORDER — VITAMIN D (ERGOCALCIFEROL) 1.25 MG (50000 UNIT) PO CAPS: 50000.0000 [IU] | ORAL_CAPSULE | ORAL | Status: DC

## 2015-08-09 NOTE — Progress Notes (Signed)
Subjective:  This chart was scribed for Deborah Sorenson, MD by Deborah White, Medical Scribe. This patient was seen in Room 2 and the patient's care was started at 10:39 AM.   Patient ID: Deborah White, female    DOB: March 21, 1986, 29 y.o.   MRN: [id number]  Chief Complaint  Patient presents with  . Follow-up   HPI HPI Comments: Deborah White is a 29 y.o. female who presents to [facility] for a follow up.  Pt was seen 2 months prior with numerous complaints. Pt has numerous breast cysts and fibroadenomas which are being followed very closely as pt's mother developed breast cancer at 69 years old and passed away. She has a history of hypothyroidism, non-compliant with medication, but when checked at visit 2 months ago, entire thyroid panel was normal. Pt did have a numerous complaints as far as hair, skin, menses, dizziness, nerve pain, and was found to have a vitamin B12 that was on the low end of normal, so pt was recommended to take a daily B12 supplement. She was placed on high does, once weekly, Vitamin D. She was on iron supplements for history of iron deficient anemia, with normal Iron levels and hemoglobin, so shwa was recommended to decrease iron daily, and increase it in diet. Pt was sent to ENT to evaluate her vertigo symptoms, and was placed on QHS wrist splint for her carpal tunnel. ENT note was received and scan into system, they are not yet available, but reported stable hearing loss, but did not address vertigo. I was curious as to whether pt could have PCOS due to hirsutism acne and minorasia. No insulin resistance. Could consider checking [place] of ovaries and androgen levels. Consider OCP's to treat. Pt was advised that orthostatic symptoms were benign, was advised to increase salt and protein, and to hydrate.   Today, pt notes that the ENT physician reports that everything was nl. She reports that her dizziness has resolved. Pt indicates that she still sufferers  from  hand numbness, more on her right hand. Pt reports that she has associated symptoms of weakness, and trouble grasping objects. Pt indicates that she had a ganglion cystsurgery about 3 years ago on her left wrist. She notes that the current symptoms ara similar to that of the prior one. She reports that he does not wish to have another surgery done unless there are no other options. Pt notes that she is compliant with wearing a brace at bed time, and reports finding relief with it when she wakes up in the morning.   Pt states that her menstrual period have been less severe.   Pt reports that her rash on the breast has resolved.   Pt indicates that she has not been compliant with taking vitamin D due to not receiving the prescription for it.   Pt notes that taking green tea pills have helped her to lose weight, she states that they made the taste of candy no as appealing.     Patient Active Problem List   Diagnosis Date Noted  . Follow up 03/09/2014  . Upper abdominal pain 03/09/2014  . Vitamin D deficiency 11/29/2013  . Anemia 11/29/2013  . Breast lump in female 11/29/2013  . Other and unspecified hyperlipidemia 11/29/2013  . History of breast lump 11/08/2013  . Screening 11/08/2013   Past Medical History  Diagnosis Date  . Thyroid disease   . Anemia    No past surgical history on file. No  Known Allergies Prior to Admission medications   Medication Sig Start Date End Date Taking? Authorizing Provider  B Complex Vitamins (VITAMIN B COMPLEX PO) Take by mouth once.   Yes Historical Provider, MD  clotrimazole (LOTRIMIN) 1 % cream Apply 1 application topically 2 (two) times daily. X 2-4 wks 06/25/15  Yes Sherren Mocha, MD  ferrous fumarate (HEMOCYTE - 106 MG FE) 325 (106 FE) MG TABS tablet Take 1 tablet by mouth.   Yes Historical Provider, MD  Multiple Vitamins-Minerals (HAIR/SKIN/NAILS/BIOTIN) TABS Take by mouth.    Historical Provider, MD   Social History   Social History  .  Marital Status: Single    Spouse Name: N/A  . Number of Children: N/A  . Years of Education: N/A   Occupational History  . Not on file.   Social History Main Topics  . Smoking status: Current Some Day Smoker  . Smokeless tobacco: Not on file  . Alcohol Use: No  . Drug Use: No  . Sexual Activity: Not Currently   Other Topics Concern  . Not on file   Social History Narrative   Depression screen [facility] 2/9 08/09/2015 06/11/2015  Decreased Interest 1 0  Down, Depressed, Hopeless 1 0  PHQ - 2 Score 2 0  Altered sleeping 1 -  Tired, decreased energy 0 -  Change in appetite 2 -  Feeling bad or failure about yourself  3 -  Trouble concentrating 0 -  Moving slowly or fidgety/restless 0 -  Suicidal thoughts 0 -  PHQ-9 Score 8 -  Difficult doing work/chores Somewhat difficult -      Review of Systems  Constitutional: Negative for fever, chills, diaphoresis, activity change, appetite change and fatigue.  HENT: Positive for hearing loss.   Eyes: Negative for visual disturbance.  Genitourinary: Negative for urgency, vaginal bleeding, vaginal discharge, menstrual problem and pelvic pain.  Musculoskeletal: Positive for myalgias, joint swelling and arthralgias. Negative for back pain and gait problem.  Skin: Negative for color change and rash.  Neurological: Positive for weakness (hands) and numbness (hands). Negative for dizziness, light-headedness and headaches.  Psychiatric/Behavioral: Positive for sleep disturbance and dysphoric mood. The patient is not nervous/anxious.        Objective:   Physical Exam  Constitutional: She is oriented to person, place, and time. She appears well-developed and well-nourished. No distress.  HENT:  Head: Normocephalic and atraumatic.  Eyes: EOM are normal. Pupils are equal, round, and reactive to light.  Neck: Neck supple.  Cardiovascular: Normal rate.   Pulmonary/Chest: Effort normal.  Musculoskeletal:  No pain over distal ulna, over scaphoid,  or over carpal points.   Neurological: She is alert and oriented to person, place, and time. No cranial nerve deficit.  Grasp on opposition are 5/5.  Flexion and extension on lateral rotation are 5/5.    Skin: Skin is warm and dry.  Small ganglion cyst are palpable on ulnar aspect of ulna  Psychiatric: She has a normal mood and affect. Her behavior is normal.  Nursing note and vitals reviewed.  BP 110/76 mmHg  Pulse 81  Temp(Src) 97.9 F (36.6 C) (Oral)  Resp 16  Ht 5' 4.25" (1.632 m)  Wt 214 lb (97.07 kg)  BMI 36.45 kg/m2  SpO2 99%  LMP 07/21/2015     Assessment & Plan:   1. 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 Vitamin D deficiency - start high dose vit D  3. Bilateral carpal tunnel syndrome - less pain with wearing qhs splints but having subjective weakness in right thenar eminence so refer to hand for further eval  4. Obesity - working on tlc - ok to try green tea supp    Orders Placed This Encounter  Procedures  . Ambulatory referral to Hand Surgery    Referral Priority:  Routine    Referral Type:  Surgical    Referral Reason:  Specialty Services Required    Requested Specialty:  Hand Surgery    Number of Visits Requested:  1    Meds ordered this encounter  Medications  . B Complex Vitamins (VITAMIN B COMPLEX PO)    Sig: Take by mouth once.  . Vitamin D, Ergocalciferol, (DRISDOL) 50000 UNITS CAPS capsule    Sig: Take 1 capsule (50,000 Units total) by mouth every 7 (seven) days.    Dispense:  12 capsule    Refill:  1    I personally performed the services described in this documentation, which was scribed in my presence. The recorded information has been reviewed and considered, and addended by me as needed.  Deborah Sorenson, MD MPH

## 2015-08-09 NOTE — Patient Instructions (Signed)
Ganglion Cyst A ganglion cyst is a noncancerous, fluid-filled lump that occurs near joints or tendons. The ganglion cyst grows out of a joint or the lining of a tendon. It most often develops in the hand or wrist but can also develop in the shoulder, elbow, hip, knee, ankle, or foot. The round or oval ganglion can be pea sized or larger than a grape. Increased activity may enlarge the size of the cyst because more fluid starts to build up.  CAUSES  It is not completely known what causes a ganglion cyst to grow. However, it may be related to:  Inflammation or irritation around the joint.  An injury.  Repetitive movements or overuse.  Arthritis. SYMPTOMS  A lump most often appears in the hand or wrist, but can occur in other areas of the body. Generally, the lump is painless without other symptoms. However, sometimes pain can be felt during activity or when pressure is applied to the lump. The lump may even be tender to the touch. Tingling, pain, numbness, or muscle weakness can occur if the ganglion cyst presses on a nerve. Your grip may be weak and you may have less movement in your joints.  DIAGNOSIS  Ganglion cysts are most often diagnosed based on a physical exam, noting where the cyst is and how it looks. Your caregiver will feel the lump and may shine a light alongside it. If it is a ganglion, a light often shines through it. Your caregiver may order an X-ray, ultrasound, or MRI to rule out other conditions. TREATMENT  Ganglions usually go away on their own without treatment. If pain or other symptoms are involved, treatment may be needed. Treatment is also needed if the ganglion limits your movement or if it   Vitamin D Deficiency Vitamin D is an important vitamin that your body needs. Having too little of it in your body is called a deficiency. A very bad deficiency can make your bones soft and can cause a condition called rickets.  Vitamin D is important to your body for different  reasons, such as:   It helps your body absorb 2 minerals called calcium and phosphorus.  It helps make your bones healthy.  It may prevent some diseases, such as diabetes and multiple sclerosis.  It helps your muscles and heart. You can get vitamin D in several ways. It is a natural part of some foods. The vitamin is also added to some dairy products and cereals. Some people take vitamin D supplements. Also, your body makes vitamin D when you are in the sun. It changes the sun's rays into a form of the vitamin that your body can use. CAUSES   Not eating enough foods that contain vitamin D.  Not getting enough sunlight.  Having certain digestive system diseases that make it hard to absorb vitamin D. These diseases include Crohn's disease, chronic pancreatitis, and cystic fibrosis.  Having a surgery in which part of the stomach or small intestine is removed.  Being obese. Fat cells pull vitamin D out of your blood. That means that obese people may not have enough vitamin D left in their blood and in other body tissues.  Having chronic kidney or liver disease. RISK FACTORS Risk factors are things that make you more likely to develop a vitamin D deficiency. They include:  Being older.  Not being able to get outside very much.  Living in a nursing home.  Having had broken bones.  Having weak or thin bones (osteoporosis).  Having a disease or condition that changes how your body absorbs vitamin D.  Having dark skin.  Some medicines such as seizure medicines or steroids.  Being overweight or obese. SYMPTOMS Mild cases of vitamin D deficiency may not have any symptoms. If you have a very bad case, symptoms may include:  Bone pain.  Muscle pain.  Falling often.  Broken bones caused by a minor injury, due to osteoporosis. DIAGNOSIS A blood test is the best way to tell if you have a vitamin D deficiency. TREATMENT Vitamin D deficiency can be treated in different ways.  Treatment for vitamin D deficiency depends on what is causing it. Options include:  Taking vitamin D supplements.  Taking a calcium supplement. Your caregiver will suggest what dose is best for you. HOME CARE INSTRUCTIONS  Take any supplements that your caregiver prescribes. Follow the directions carefully. Take only the suggested amount.  Have your blood tested 2 months after you start taking supplements.  Eat foods that contain vitamin D. Healthy choices include:  Fortified dairy products, cereals, or juices. Fortified means vitamin D has been added to the food. Check the label on the package to be sure.  Fatty fish like salmon or trout.  Eggs.  Oysters.  Do not use a tanning bed.  Keep your weight at a healthy level. Lose weight if you need to.  Keep all follow-up appointments. Your caregiver will need to perform blood tests to make sure your vitamin D deficiency is going away. SEEK MEDICAL CARE IF:  You have any questions about your treatment.  You continue to have symptoms of vitamin D deficiency.  You have nausea or vomiting.  You are constipated.  You feel confused.  You have severe abdominal or back pain. MAKE SURE YOU:  Understand these instructions.  Will watch your condition.  Will get help right away if you are not doing well or get worse. Document Released: 02/15/2012 Document Revised: 03/20/2013 Document Reviewed: 02/15/2012 [facility] Patient Information 2015 [place], [place]. This information is not intended to replace advice given to you by your health care provider. Make sure you discuss any questions you have with your health care provider. gets infected. Treatment options include:  Wearing a wrist or finger brace or splint.  Taking anti-inflammatory medicine.  Draining fluid from the lump with a needle (aspiration).  Injecting a steroid into the joint.  Surgery to remove the ganglion cyst and its stalk that is attached to the joint or tendon.  However, ganglion cysts can grow back. HOME CARE INSTRUCTIONS   Do not press on the ganglion, poke it with a needle, or hit it with a heavy object. You may rub the lump gently and often. Sometimes fluid moves out of the cyst.  Only take medicines as directed by your caregiver.  Wear your brace or splint as directed by your caregiver. SEEK MEDICAL CARE IF:   Your ganglion becomes larger or more painful.  You have increased redness, red streaks, or swelling.  You have pus coming from the lump.  You have weakness or numbness in the affected area. MAKE SURE YOU:   Understand these instructions.  Will watch your condition.  Will get help right away if you are not doing well or get worse. Document Released: 11/20/2000 Document Revised: 08/17/2012 Document Reviewed: 01/17/2008 [facility] Patient Information 2015 [place], [place]. This information is not intended to replace advice given to you by your health care provider. Make sure you discuss any questions you have with your health  care provider.  

## 2015-09-24 ENCOUNTER — Ambulatory Visit (INDEPENDENT_AMBULATORY_CARE_PROVIDER_SITE_OTHER): Payer: BLUE CROSS/BLUE SHIELD | Admitting: Physician Assistant

## 2015-09-24 ENCOUNTER — Other Ambulatory Visit: Payer: Self-pay | Admitting: Physician Assistant

## 2015-09-24 VITALS — BP 120/76 | HR 100 | Temp 98.7°F | Resp 16 | Ht 65.0 in | Wt 213.0 lb

## 2015-09-24 DIAGNOSIS — J069 Acute upper respiratory infection, unspecified: Secondary | ICD-10-CM

## 2015-09-24 DIAGNOSIS — R05 Cough: Secondary | ICD-10-CM

## 2015-09-24 DIAGNOSIS — R059 Cough, unspecified: Secondary | ICD-10-CM

## 2015-09-24 MED ORDER — CODEINE POLT-CHLORPHEN POLT ER 14.7-2.8 MG/5ML PO SUER: 5.0000 mL | Freq: Every evening | ORAL | Status: DC | PRN

## 2015-09-24 MED ORDER — GUAIFENESIN ER 1200 MG PO TB12: 1.0000 | ORAL_TABLET | Freq: Two times a day (BID) | ORAL | Status: DC | PRN

## 2015-09-24 MED ORDER — AZITHROMYCIN 250 MG PO TABS: ORAL_TABLET | ORAL | Status: DC

## 2015-09-24 NOTE — Patient Instructions (Signed)
Please take the medications as prescribed and make sure you are hydrating very well (64oz of water per day).   You will take the azithromycin after 4 days if you are not feeling any improvement.  If you start to have a productive cough, go ahead and start the azithromycin.   Take tylenol and ibuprofen for the headache.    Upper Respiratory Infection, Adult Most upper respiratory infections (URIs) are a viral infection of the air passages leading to the lungs. A URI affects the nose, throat, and upper air passages. The most common type of URI is nasopharyngitis and is typically referred to as "the common cold." URIs run their course and usually go away on their own. Most of the time, a URI does not require medical attention, but sometimes a bacterial infection in the upper airways can follow a viral infection. This is called a secondary infection. Sinus and middle ear infections are common types of secondary upper respiratory infections. Bacterial pneumonia can also complicate a URI. A URI can worsen asthma and chronic obstructive pulmonary disease (COPD). Sometimes, these complications can require emergency medical care and may be life threatening.  CAUSES Almost all URIs are caused by viruses. A virus is a type of germ and can spread from one person to another.  RISKS FACTORS You may be at risk for a URI if:   You smoke.   You have chronic heart or lung disease.  You have a weakened defense (immune) system.   You are very young or very old.   You have nasal allergies or asthma.  You work in crowded or poorly ventilated areas.  You work in health care facilities or schools. SIGNS AND SYMPTOMS  Symptoms typically develop 2-3 days after you come in contact with a cold virus. Most viral URIs last 7-10 days. However, viral URIs from the influenza virus (flu virus) can last 14-18 days and are typically more severe. Symptoms may include:   Runny or stuffy (congested) nose.   Sneezing.    Cough.   Sore throat.   Headache.   Fatigue.   Fever.   Loss of appetite.   Pain in your forehead, behind your eyes, and over your cheekbones (sinus pain).  Muscle aches.  DIAGNOSIS  Your health care provider may diagnose a URI by:  Physical exam.  Tests to check that your symptoms are not due to another condition such as:  Strep throat.  Sinusitis.  Pneumonia.  Asthma. TREATMENT  A URI goes away on its own with time. It cannot be cured with medicines, but medicines may be prescribed or recommended to relieve symptoms. Medicines may help:  Reduce your fever.  Reduce your cough.  Relieve nasal congestion. HOME CARE INSTRUCTIONS   Take medicines only as directed by your health care provider.   Gargle warm saltwater or take cough drops to comfort your throat as directed by your health care provider.  Use a warm mist humidifier or inhale steam from a shower to increase air moisture. This may make it easier to breathe.  Drink enough fluid to keep your urine clear or pale yellow.   Eat soups and other clear broths and maintain good nutrition.   Rest as needed.   Return to work when your temperature has returned to normal or as your health care provider advises. You may need to stay home longer to avoid infecting others. You can also use a face mask and careful hand washing to prevent spread of the virus.  Increase the usage of your inhaler if you have asthma.   Do not use any tobacco products, including cigarettes, chewing tobacco, or electronic cigarettes. If you need help quitting, ask your health care provider. PREVENTION  The best way to protect yourself from getting a cold is to practice good hygiene.   Avoid oral or hand contact with people with cold symptoms.   Wash your hands often if contact occurs.  There is no clear evidence that vitamin C, vitamin E, echinacea, or exercise reduces the chance of developing a cold. However, it is  always recommended to get plenty of rest, exercise, and practice good nutrition.  SEEK MEDICAL CARE IF:   You are getting worse rather than better.   Your symptoms are not controlled by medicine.   You have chills.  You have worsening shortness of breath.  You have brown or red mucus.  You have yellow or brown nasal discharge.  You have pain in your face, especially when you bend forward.  You have a fever.  You have swollen neck glands.  You have pain while swallowing.  You have white areas in the back of your throat. SEEK IMMEDIATE MEDICAL CARE IF:   You have severe or persistent:  Headache.  Ear pain.  Sinus pain.  Chest pain.  You have chronic lung disease and any of the following:  Wheezing.  Prolonged cough.  Coughing up blood.  A change in your usual mucus.  You have a stiff neck.  You have changes in your:  Vision.  Hearing.  Thinking.  Mood. MAKE SURE YOU:   Understand these instructions.  Will watch your condition.  Will get help right away if you are not doing well or get worse.   This information is not intended to replace advice given to you by your health care provider. Make sure you discuss any questions you have with your health care provider.   Document Released: 05/19/2001 Document Revised: 04/09/2015 Document Reviewed: 02/28/2014 [employer] Interactive Patient Education [employer]

## 2015-09-24 NOTE — Progress Notes (Signed)
[facility] [address], [place] [address] [phone]  Date:  09/24/2015   Name:  Deborah White   DOB:  11/16/1986   MRN:  [id number]  PCP:  [name], MD    History of Present Illness:  Deborah White is a 29 y.o. female patient who presents to [facility] for chief complaint headache, non-productive cough, and right sided pain. Patient has had 4 of a subjective fever.  She has non-productive dry cough.  No dyspnea, or sob.  She has headache in the back side of head.  She has difficulty sleeping secondary to fever and cough.  She has no dizziness.  She has taken only tylenol once.  She also complains of right axillary pain near bra line.  She has pain with movement, and possible pain with inhalation. No dysuria, hematuria, or frequency.   Patient Active Problem List   Diagnosis Date Noted  . Follow up 03/09/2014  . Upper abdominal pain 03/09/2014  . Vitamin D deficiency 11/29/2013  . Anemia 11/29/2013  . Breast lump in female 11/29/2013  . Other and unspecified hyperlipidemia 11/29/2013  . History of breast lump 11/08/2013  . Screening 11/08/2013    Past Medical History  Diagnosis Date  . Thyroid disease   . Anemia     History reviewed. No pertinent past surgical history.  Social History  Substance Use Topics  . Smoking status: Current Some Day Smoker  . Smokeless tobacco: Never Used  . Alcohol Use: No    Family History  Problem Relation Age of Onset  . Breast cancer Mother   . Hypertension Mother   . Cancer Maternal Uncle     brain cancer   . Heart disease Maternal Grandmother     No Known Allergies  Medication list has been reviewed and updated.  Current Outpatient Prescriptions on File Prior to Visit  Medication Sig Dispense Refill  . B Complex Vitamins (VITAMIN B COMPLEX PO) Take by mouth once.    . clotrimazole (LOTRIMIN) 1 % cream Apply 1 application topically 2 (two) times daily. X 2-4 wks 60 g 0  . ferrous fumarate  (HEMOCYTE - 106 MG FE) 325 (106 FE) MG TABS tablet Take 1 tablet by mouth.     No current facility-administered medications on file prior to visit.    ROS ROS otherwise unremarkable unless listed above.   Physical Examination: BP 120/76 mmHg  Pulse 118  Temp(Src) 98.7 F (37.1 C) (Oral)  Resp 16  Ht  (1.651 m)  Wt 213 lb (96.616 kg)  BMI 35.44 kg/m2  SpO2 99%  LMP 09/20/2015 Ideal Body Weight: Weight in (lb) to have BMI = 25: 149.9  Physical Exam  Constitutional: She is oriented to person, place, and time. She appears well-developed and well-nourished. No distress.  HENT:  Head: Normocephalic and atraumatic.  Right Ear: External ear normal.  Left Ear: External ear normal.  Eyes: Conjunctivae and EOM are normal. Pupils are equal, round, and reactive to light.  Cardiovascular: Normal rate, regular rhythm and normal heart sounds.  Exam reveals no gallop and no friction rub.   No murmur heard. Pulses:      Radial pulses are 2+ on the right side, and 2+ on the left side.  Pulmonary/Chest: Effort normal. No accessory muscle usage. No respiratory distress. She has no decreased breath sounds. She has no wheezes. She has no rhonchi.  Abdominal: There is no CVA tenderness.  Lymphadenopathy:       Head (  right side): No submandibular, no tonsillar, no preauricular and no posterior auricular adenopathy present.       Head (left side): No submandibular, no tonsillar, no preauricular and no posterior auricular adenopathy present.    She has no cervical adenopathy.    She has no axillary adenopathy.  Neurological: She is alert and oriented to person, place, and time.  Skin: She is not diaphoretic.  Psychiatric: She has a normal mood and affect. Her behavior is normal.     Assessment and Plan: Jannifer Franklinibras Abdulhafez is a 29 y.o. female who is here today with non-productive cough.  Likely viral, and will be treated supportively at this time.  Advised to increase hydration.    Cough -  Plan: azithromycin (ZITHROMAX) 250 MG tablet, Guaifenesin (MUCINEX MAXIMUM STRENGTH) 1200 MG TB12  Acute upper respiratory infection - Plan: azithromycin (ZITHROMAX) 250 MG tablet, Codeine Polt-Chlorphen Polt ER (TUZISTRA XR) 14.7-2.8 MG/5ML SUER, Guaifenesin (MUCINEX MAXIMUM STRENGTH) 1200 MG TB12  Trena PlattStephanie Francene Mcerlean, PA-C [facility] 09/24/2015 8:22 AM

## 2015-09-24 NOTE — Progress Notes (Signed)
Pt's pharm calling stating that the coupon will only work for 90 mL.  Rewriting for that amount.  Deborah BostonMichael Egon Dittus, MS, PA-C   6:56 PM, 09/24/2015

## 2015-09-25 ENCOUNTER — Other Ambulatory Visit: Payer: Self-pay | Admitting: Physician Assistant

## 2015-09-27 ENCOUNTER — Ambulatory Visit (INDEPENDENT_AMBULATORY_CARE_PROVIDER_SITE_OTHER): Payer: BLUE CROSS/BLUE SHIELD | Admitting: Family Medicine

## 2015-09-27 VITALS — BP 120/72 | HR 119 | Temp 98.4°F | Resp 17 | Ht 65.5 in | Wt 213.0 lb

## 2015-09-27 DIAGNOSIS — J209 Acute bronchitis, unspecified: Secondary | ICD-10-CM | POA: Diagnosis not present

## 2015-09-27 DIAGNOSIS — E860 Dehydration: Secondary | ICD-10-CM

## 2015-09-27 DIAGNOSIS — J020 Streptococcal pharyngitis: Secondary | ICD-10-CM | POA: Diagnosis not present

## 2015-09-27 LAB — POCT CBC
Granulocyte percent: 79 %G (ref 37–80)
HCT, POC: 35.6 % — AB (ref 37.7–47.9)
Hemoglobin: 12 g/dL — AB (ref 12.2–16.2)
Lymph, poc: 2.5 (ref 0.6–3.4)
MCH, POC: 26.7 pg — AB (ref 27–31.2)
MCHC: 33.7 g/dL (ref 31.8–35.4)
MCV: 79.3 fL — AB (ref 80–97)
MID (cbc): 0.9 (ref 0–0.9)
MPV: 8.3 fL (ref 0–99.8)
POC Granulocyte: 12.6 — AB (ref 2–6.9)
POC LYMPH PERCENT: 15.6 %L (ref 10–50)
POC MID %: 5.4 %M (ref 0–12)
Platelet Count, POC: 333 10*3/uL (ref 142–424)
RBC: 4.49 M/uL (ref 4.04–5.48)
RDW, POC: 12.6 %
WBC: 15.9 10*3/uL — AB (ref 4.6–10.2)

## 2015-09-27 LAB — POCT URINALYSIS DIP (MANUAL ENTRY)
Bilirubin, UA: NEGATIVE
Blood, UA: NEGATIVE
Glucose, UA: NEGATIVE
Ketones, POC UA: NEGATIVE
Leukocytes, UA: NEGATIVE
Nitrite, UA: NEGATIVE
Protein Ur, POC: NEGATIVE
Spec Grav, UA: <=1.005
Urobilinogen, UA: 0.2
pH, UA: 6.5

## 2015-09-27 LAB — POC MICROSCOPIC URINALYSIS (UMFC): Mucus: ABSENT

## 2015-09-27 LAB — POCT RAPID STREP A (OFFICE): Rapid Strep A Screen: POSITIVE — AB

## 2015-09-27 MED ORDER — AMOXICILLIN-POT CLAVULANATE 875-125 MG PO TABS: 1.0000 | ORAL_TABLET | Freq: Two times a day (BID) | ORAL | Status: DC

## 2015-09-27 MED ORDER — HYDROCODONE-ACETAMINOPHEN 7.5-325 MG/15ML PO SOLN: 5.0000 mL | ORAL | Status: DC | PRN

## 2015-09-27 MED ORDER — CEFTRIAXONE SODIUM 1 G IV SOLR
1.0000 g | Freq: Once | INTRAVENOUS | Status: AC
  Administered 2015-09-27: 1 g via INTRAVENOUS

## 2015-09-27 MED ORDER — CEFTRIAXONE SODIUM 2 G IJ SOLR: 1.0000 g | Freq: Once | INTRAMUSCULAR | Status: DC

## 2015-09-27 MED ORDER — BENZONATATE 200 MG PO CAPS: 200.0000 mg | ORAL_CAPSULE | Freq: Three times a day (TID) | ORAL | Status: DC | PRN

## 2015-09-27 NOTE — Patient Instructions (Addendum)
Strep Throat Strep throat is a bacterial infection of the throat. Your health care provider may call the infection tonsillitis or pharyngitis, depending on whether there is swelling in the tonsils or at the back of the throat. Strep throat is most common during the cold months of the year in children who are 5-29 years of age, but it can happen during any season in people of any age. This infection is spread from person to person (contagious) through coughing, sneezing, or close contact. CAUSES Strep throat is caused by the bacteria called Streptococcus pyogenes. RISK FACTORS This condition is more likely to develop in:  People who spend time in crowded places where the infection can spread easily.  People who have close contact with someone who has strep throat. SYMPTOMS Symptoms of this condition include:  Fever or chills.   Redness, swelling, or pain in the tonsils or throat.  Pain or difficulty when swallowing.  White or yellow spots on the tonsils or throat.  Swollen, tender glands in the neck or under the jaw.  Red rash all over the body (rare). DIAGNOSIS This condition is diagnosed by performing a rapid strep test or by taking a swab of your throat (throat culture test). Results from a rapid strep test are usually ready in a few minutes, but throat culture test results are available after one or two days. TREATMENT This condition is treated with antibiotic medicine. HOME CARE INSTRUCTIONS Medicines  Take over-the-counter and prescription medicines only as told by your health care provider.  Take your antibiotic as told by your health care provider. Do not stop taking the antibiotic even if you start to feel better.  Have family members who also have a sore throat or fever tested for strep throat. They may need antibiotics if they have the strep infection. Eating and Drinking  Do not share food, drinking cups, or personal items that could cause the infection to spread to  other people.  If swallowing is difficult, try eating soft foods until your sore throat feels better.  Drink enough fluid to keep your urine clear or pale yellow. General Instructions  Gargle with a salt-water mixture 3-4 times per day or as needed. To make a salt-water mixture, completely dissolve -1 tsp of salt in 1 cup of warm water.  Make sure that all household members wash their hands well.  Get plenty of rest.  Stay home from school or work until you have been taking antibiotics for 24 hours.  Keep all follow-up visits as told by your health care provider. This is important. SEEK MEDICAL CARE IF:  The glands in your neck continue to get bigger.  You develop a rash, cough, or earache.  You cough up a thick liquid that is green, yellow-brown, or bloody.  You have pain or discomfort that does not get better with medicine.  Your problems seem to be getting worse rather than better.  You have a fever. SEEK IMMEDIATE MEDICAL CARE IF:  You have new symptoms, such as vomiting, severe headache, stiff or painful neck, chest pain, or shortness of breath.  You have severe throat pain, drooling, or changes in your voice.  You have swelling of the neck, or the skin on the neck becomes red and tender.  You have signs of dehydration, such as fatigue, dry mouth, and decreased urination.  You become increasingly sleepy, or you cannot wake up completely.  Your joints become red or painful.   This information is not intended to replace   advice given to you by your health care provider. Make sure you discuss any questions you have with your health care provider.   Document Released: 11/20/2000 Document Revised: 08/14/2015 Document Reviewed: 03/18/2015 Elsevier Interactive Patient Education 2016 Elsevier Inc[employer]Dehydration, Adult Dehydration is a condition in which you do not have enough fluid or water in your body. It happens when you take in less fluid than you lose. Vital organs such  as the kidneys, brain, and heart cannot function without a proper amount of fluids. Any loss of fluids from the body can cause dehydration.  Dehydration can range from mild to severe. This condition should be treated right away to help prevent it from becoming severe. CAUSES  This condition may be caused by:  Vomiting.  Diarrhea.  Excessive sweating, such as when exercising in hot or humid weather.  Not drinking enough fluid during strenuous exercise or during an illness.  Excessive urine output.  Fever.  Certain medicines. RISK FACTORS This condition is more likely to develop in:  People who are taking certain medicines that cause the body to lose excess fluid (diuretics).   People who have a chronic illness, such as diabetes, that may increase urination.  Older adults.   People who live at high altitudes.   People who participate in endurance sports.  SYMPTOMS  Mild Dehydration  Thirst.  Dry lips.  Slightly dry mouth.  Dry, warm skin. Moderate Dehydration  Very dry mouth.   Muscle cramps.   Dark urine and decreased urine production.   Decreased tear production.   Headache.   Light-headedness, especially when you stand up from a sitting position.  Severe Dehydration  Changes in skin.   Cold and clammy skin.   Skin does not spring back quickly when lightly pinched and released.   Changes in body fluids.   Extreme thirst.   No tears.   Not able to sweat when body temperature is high, such as in hot weather.   Minimal urine production.   Changes in vital signs.   Rapid, weak pulse (more than 100 beats per minute when you are sitting still).   Rapid breathing.   Low blood pressure.   Other changes.   Sunken eyes.   Cold hands and feet.   Confusion.  Lethargy and difficulty being awakened.  Fainting (syncope).   Short-term weight loss.   Unconsciousness. DIAGNOSIS  This condition may be diagnosed  based on your symptoms. You may also have tests to determine how severe your dehydration is. These tests may include:   Urine tests.   Blood tests.  TREATMENT  Treatment for this condition depends on the severity. Mild or moderate dehydration can often be treated at home. Treatment should be started right away. Do not wait until dehydration becomes severe. Severe dehydration needs to be treated at the hospital. Treatment for Mild Dehydration  Drinking plenty of water to replace the fluid you have lost.   Replacing minerals in your blood (electrolytes) that you may have lost.  Treatment for Moderate Dehydration  Consuming oral rehydration solution (ORS). Treatment for Severe Dehydration  Receiving fluid through an IV tube.   Receiving electrolyte solution through a feeding tube that is passed through your nose and into your stomach (nasogastric tube or NG tube).  Correcting any abnormalities in electrolytes. HOME CARE INSTRUCTIONS   Drink enough fluid to keep your urine clear or pale yellow.   Drink water or fluid slowly by taking small sips. You can also try sucking on ice  cubes.  Have food or beverages that contain electrolytes. Examples include bananas and sports drinks.  Take over-the-counter and prescription medicines only as told by your health care provider.   Prepare ORS according to the manufacturer's instructions. Take sips of ORS every 5 minutes until your urine returns to normal.  If you have vomiting or diarrhea, continue to try to drink water, ORS, or both.   If you have diarrhea, avoid:   Beverages that contain caffeine.   Fruit juice.   Milk.   Carbonated soft drinks.  Do not take salt tablets. This can lead to the condition of having too much sodium in your body (hypernatremia).  SEEK MEDICAL CARE IF:  You cannot eat or drink without vomiting.  You have had moderate diarrhea during a period of more than 24 hours.  You have a  fever. SEEK IMMEDIATE MEDICAL CARE IF:   You have extreme thirst.  You have severe diarrhea.  You have not urinated in 6-8 hours, or you have urinated only a small amount of very dark urine.  You have shriveled skin.  You are dizzy, confused, or both.   This information is not intended to replace advice given to you by your health care provider. Make sure you discuss any questions you have with your health care provider.   Document Released: 11/23/2005 Document Revised: 08/14/2015 Document Reviewed: 04/10/2015 Elsevier Interactive Patient Education [employer]

## 2015-09-27 NOTE — Progress Notes (Addendum)
Subjective:  This chart was scribed for [name], MD by Andrew Au, ED Scribe. This patient was seen in room 10 and the patient's care was started at 8:48 AM.   Patient ID: Deborah White, female    DOB: January 22, 1986, 29 y.o.   MRN: [id number]  HPI   Chief Complaint  Patient presents with  . URI   HPI Comments: Deborah White is a 29 y.o. female who [facility] to the [facility] Follow [facility]. Pt was seen 3 days ago for 4 days of intermittent fever. Suspected viral bronchitis and URI. Put on Zpak and mucinex.  Pt still has cough, worse at night and with laying down, a persistent HA that is worse with cough, and fever, highest being 102. She also reports sweats at night,  palpitations of rapid heart rate and decreased appetite. She has been taking medication prescribed at last visit without relief to symptoms.  Pt denies dizziness , light headedness, visual disturbance, photophobia, nausea and emesis.  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    Past Medical History  Diagnosis Date  . Thyroid disease   . Anemia    No Known Allergies Prior to Admission medications   Medication Sig Start Date End Date Taking? Authorizing Provider  azithromycin (ZITHROMAX) 250 MG tablet Take 2 tabs PO x 1 dose, then 1 tab PO QD x 4 days 09/24/15  Yes Stephanie D English, PA  Guaifenesin (MUCINEX MAXIMUM STRENGTH) 1200 MG TB12 Take 1 tablet (1,200 mg total) by mouth every 12 (twelve) hours as needed. 09/24/15  Yes Stephanie D English, PA  guaiFENesin-codeine (ROBITUSSIN AC) 100-10 MG/5ML syrup Take 5 mLs by mouth 3 (three) times daily as needed for cough.   Yes Historical Provider, MD  MAGNESIUM PO Take by mouth.   Yes Historical Provider, MD  B Complex Vitamins (VITAMIN B COMPLEX PO) Take by mouth once.    Historical Provider, MD  clotrimazole (LOTRIMIN) 1 % cream Apply 1 application topically 2 (two) times daily. X 2-4 wks Patient not taking: Reported on  09/27/2015 06/25/15   Sherren Mocha, MD  Codeine Polt-Chlorphen Polt ER (TUZISTRA XR) 14.7-2.8 MG/5ML SUER Take 5-10 mLs by mouth at bedtime as needed. Patient not taking: Reported on 09/27/2015 09/24/15   Ofilia Neas, PA-C  ferrous fumarate (HEMOCYTE - 106 MG FE) 325 (106 FE) MG TABS tablet Take 1 tablet by mouth.    Historical Provider, MD  Chilton Si Tea, Camillia sinensis, (GREEN TEA PO) Take by mouth.    Historical Provider, MD   Review of Systems  Constitutional: Positive for fever, chills, diaphoresis and appetite change.  Eyes: Negative for photophobia and visual disturbance.  Respiratory: Positive for cough.   Cardiovascular: Positive for palpitations.  Gastrointestinal: Negative for nausea and vomiting.  Neurological: Positive for headaches. Negative for dizziness and light-headedness.       Objective:   Physical Exam  Constitutional: She is oriented to person, place, and time. She appears well-developed and well-nourished. No distress.  HENT:  Head: Normocephalic and atraumatic.  Right Ear: Tympanic membrane is erythematous. A middle ear effusion is present.  Left Ear: Tympanic membrane is erythematous. A middle ear effusion is present.  Mouth/Throat: Posterior oropharyngeal edema and posterior oropharyngeal erythema present.  Nasal rhinitis  Eyes: Conjunctivae and EOM are normal.  Neck: Neck supple.  Cardiovascular: Normal rate, regular rhythm and normal heart sounds.  Exam reveals no gallop and no friction rub.  No murmur heard. Pulmonary/Chest: Effort normal and breath sounds normal. No respiratory distress. She has no wheezes. She has no rales. She exhibits no tenderness.  Musculoskeletal: Normal range of motion.  Neurological: She is alert and oriented to person, place, and time. She displays no Babinski's sign on the right side. She displays no Babinski's sign on the left side.  Negative kernig's   Skin: Skin is warm and dry.  Psychiatric: She has a normal mood and  affect. Her behavior is normal.  Nursing note and vitals reviewed.   Filed Vitals:   09/27/15 0810 09/27/15 0911 09/27/15 0912 09/27/15 0913  BP: 122/72 122/80 118/68 120/72  Pulse: 121 67 108 119  Temp: 98.4 F (36.9 C)     TempSrc: Oral     Resp: 17     Height: 5' 5.5" (1.664 m)     Weight: 213 lb (96.616 kg)     SpO2: 96% 99% 99% 99%    Results for orders placed or performed in visit on 09/27/15  POCT CBC  Result Value Ref Range   WBC 15.9 (A) 4.6 - 10.2 K/uL   Lymph, poc 2.5 0.6 - 3.4   POC LYMPH PERCENT 15.6 10 - 50 %L   MID (cbc) 0.9 0 - 0.9   POC MID % 5.4 0 - 12 %M   POC Granulocyte 12.6 (A) 2 - 6.9   Granulocyte percent 79.0 37 - 80 %G   RBC 4.49 4.04 - 5.48 M/uL   Hemoglobin 12.0 (A) 12.2 - 16.2 g/dL   HCT, POC 40.935.6 (A) 81.137.7 - 47.9 %   MCV 79.3 (A) 80 - 97 fL   MCH, POC 26.7 (A) 27 - 31.2 pg   MCHC 33.7 31.8 - 35.4 g/dL   RDW, POC 91.412.6 %   Platelet Count, POC 333 142 - 424 K/uL   MPV 8.3 0 - 99.8 fL  POCT rapid strep A  Result Value Ref Range   Rapid Strep A Screen Positive (A) Negative  POCT urinalysis dipstick  Result Value Ref Range   Color, UA light yellow (A) yellow   Clarity, UA clear clear   Glucose, UA negative negative   Bilirubin, UA negative negative   Ketones, POC UA negative negative   Spec Grav, UA <=1.005    Blood, UA negative negative   pH, UA 6.5    Protein Ur, POC negative negative   Urobilinogen, UA 0.2    Nitrite, UA Negative Negative   Leukocytes, UA Negative Negative  POCT Microscopic Urinalysis ([facility])  Result Value Ref Range   WBC,UR,HPF,POC None None WBC/hpf   RBC,UR,HPF,POC None None RBC/hpf   Bacteria None None, Too numerous to count   Mucus Absent Absent   Epithelial Cells, UR Per Microscopy Few (A) None, Too numerous to count cells/hpf    Assessment & Plan:   1. Dehydration   2. Acute bronchitis, unspecified organism   3. Streptococcal pharyngitis   2L NS IVF given in office which improved pts sxs sig.  Recheck in  sev d to ensure improving. Rest, push fluids.  Orders Placed This Encounter  Procedures  . POCT CBC  . POCT rapid strep A  . POCT urinalysis dipstick  . POCT Microscopic Urinalysis ([facility])    Meds ordered this encounter  Medications  . DISCONTD: guaiFENesin-codeine (ROBITUSSIN AC) 100-10 MG/5ML syrup    Sig: Take 5 mLs by mouth 3 (three) times daily as needed for cough.  . DISCONTD: cefTRIAXone (ROCEPHIN) 1 g in dextrose  5 % 50 mL IVPB    Sig:     Order Specific Question:  Antibiotic Indication:    Answer:  Other Indication (list below)    Order Specific Question:  Other Indication:    Answer:  strep pharyngitis  . DISCONTD: benzonatate (TESSALON) 200 MG capsule    Sig: Take 1 capsule (200 mg total) by mouth 3 (three) times daily as needed for cough.    Dispense:  40 capsule    Refill:  0  . DISCONTD: HYDROcodone-acetaminophen (HYCET) 7.5-325 mg/15 ml solution    Sig: Take 5-10 mLs by mouth every 4 (four) hours as needed for moderate pain (or cough).    Dispense:  120 mL    Refill:  0  . DISCONTD: amoxicillin-clavulanate (AUGMENTIN) 875-125 MG tablet    Sig: Take 1 tablet by mouth 2 (two) times daily.    Dispense:  20 tablet    Refill:  0  . cefTRIAXone (ROCEPHIN) injection 1 g    Sig:   . amoxicillin-clavulanate (AUGMENTIN) 875-125 MG tablet    Sig: Take 1 tablet by mouth 2 (two) times daily.    Dispense:  20 tablet    Refill:  0  . benzonatate (TESSALON) 200 MG capsule    Sig: Take 1 capsule (200 mg total) by mouth 3 (three) times daily as needed for cough.    Dispense:  40 capsule    Refill:  0    I personally performed the services described in this documentation, which was scribed in my presence. The recorded information has been reviewed and considered, and addended by me as needed.  [name], MD MPH   By signing my name below, I, Raven Small, attest that this documentation has been prepared under the direction and in the presence of [name], MD.  Electronically  Signed: Andrew Au, ED Scribe. 09/27/2015. 10:28 AM.

## 2015-10-02 ENCOUNTER — Ambulatory Visit (INDEPENDENT_AMBULATORY_CARE_PROVIDER_SITE_OTHER): Payer: BLUE CROSS/BLUE SHIELD | Admitting: Family Medicine

## 2015-10-02 VITALS — BP 100/60 | HR 82 | Temp 98.0°F | Resp 16 | Ht 65.5 in | Wt 213.0 lb

## 2015-10-02 DIAGNOSIS — J209 Acute bronchitis, unspecified: Secondary | ICD-10-CM | POA: Diagnosis not present

## 2015-10-02 MED ORDER — ALBUTEROL SULFATE 108 (90 BASE) MCG/ACT IN AEPB: 2.0000 | INHALATION_SPRAY | RESPIRATORY_TRACT | Status: DC | PRN

## 2015-10-02 MED ORDER — ALBUTEROL SULFATE (2.5 MG/3ML) 0.083% IN NEBU
2.5000 mg | INHALATION_SOLUTION | Freq: Once | RESPIRATORY_TRACT | Status: AC
  Administered 2015-10-02: 2.5 mg via RESPIRATORY_TRACT

## 2015-10-02 MED ORDER — HYDROCODONE-HOMATROPINE 5-1.5 MG/5ML PO SYRP: 5.0000 mL | ORAL_SOLUTION | Freq: Four times a day (QID) | ORAL | Status: DC | PRN

## 2015-10-02 NOTE — Progress Notes (Signed)
Subjective:  This chart was scribed for Norberto SorensonEva Lezette Kitts MD, by Veverly FellsHatice Demirci,scribe, at [facility].  This patient was seen in room 11 and the patient's care was started at 9:55 AM.    Patient ID: Deborah FranklinNibras White, female    DOB: 05/19/1986, 29 y.o.   MRN: [id number] Chief Complaint  Patient presents with  . Cough    Not better follow up    HPI  HPI Comments: Deborah White is a 29 y.o. female who presents to the [facility] complaining of a cough and states that she is drinking plenty of water (4 L/ day).  She states she is still unable to eat and feels nauseous but denies any vomiting.  She has decreased bowel movements as she has not eaten much.  Her throat feels raw due to the constant coughing and is waking up coughing as well.  She denies any headaches. Patient states that she has never had symptoms this extreme in her life. She states that she is still taking the Augmentin, Tessalon pearls and amoxicillin.  ---- Patient was seen a week ago diagnosed with URI after 4 days of a fever.  She was started on a Z-pack and seen in the office three days later found to have a positive strep test. Z pack was stopped and she was switched to Augmentin. She was dehydrated in the office and was given 2 L of IV fluids and 1 gram of rocephin.  White count was 16 with a left shift.     Past Medical History  Diagnosis Date  . Thyroid disease   . Anemia     Current Outpatient Prescriptions on File Prior to Visit  Medication Sig Dispense Refill  . amoxicillin-clavulanate (AUGMENTIN) 875-125 MG tablet Take 1 tablet by mouth 2 (two) times daily. 20 tablet 0  . benzonatate (TESSALON) 200 MG capsule Take 1 capsule (200 mg total) by mouth 3 (three) times daily as needed for cough. 40 capsule 0  . Guaifenesin (MUCINEX MAXIMUM STRENGTH) 1200 MG TB12 Take 1 tablet (1,200 mg total) by mouth every 12 (twelve) hours as needed. 14 tablet 1  . HYDROcodone-acetaminophen (HYCET)  7.5-325 mg/15 ml solution Take 5-10 mLs by mouth every 4 (four) hours as needed for moderate pain (or cough). 120 mL 0  . B Complex Vitamins (VITAMIN B COMPLEX PO) Take by mouth once.    . ferrous fumarate (HEMOCYTE - 106 MG FE) 325 (106 FE) MG TABS tablet Take 1 tablet by mouth.    Marland Kitchen. MAGNESIUM PO Take by mouth.     No current facility-administered medications on file prior to visit.    No Known Allergies     Review of Systems  Constitutional: Positive for diaphoresis, activity change, appetite change and fatigue. Negative for fever and chills.  HENT: Positive for congestion, postnasal drip, rhinorrhea and sore throat. Negative for sinus pressure, trouble swallowing and voice change.   Eyes: Negative for pain, redness and itching.  Respiratory: Positive for cough. Negative for apnea, choking and wheezing.   Cardiovascular: Negative for chest pain and palpitations.  Gastrointestinal: Positive for nausea. Negative for vomiting and abdominal pain.  Genitourinary: Negative for dysuria and decreased urine volume.  Musculoskeletal: Positive for back pain.  Allergic/Immunologic: Negative for immunocompromised state.  Neurological: Negative for weakness and headaches.  Hematological: Negative for adenopathy.  Psychiatric/Behavioral: Positive for sleep disturbance. Negative for dysphoric mood. The patient is not nervous/anxious.        Objective:  Physical Exam  Constitutional: She is oriented to person, place, and time. She appears well-developed and well-nourished. No distress.  HENT:  Head: Normocephalic and atraumatic.  Mid ear effusion bilaterally Nasal erythema oropharyngeal erythema right is worse than left.   Eyes: Pupils are equal, round, and reactive to light.  Neck: Normal range of motion.  Cardiovascular: Normal rate, regular rhythm, S1 normal, S2 normal and normal heart sounds.  Exam reveals no friction rub.   No murmur heard. Pulmonary/Chest: Effort normal and breath  sounds normal. No respiratory distress. She has no wheezes.  Good air movement.   Musculoskeletal: Normal range of motion.  Neurological: She is alert and oriented to person, place, and time.  Skin: Skin is warm and dry.   Filed Vitals:   10/02/15 0935  BP: 100/60  Pulse: 82  Temp: 98 F (36.7 C)  TempSrc: Oral  Resp: 16  Height: 5' 5.5" (1.664 m)  Weight: 213 lb (96.616 kg)  SpO2: 98%          Assessment & Plan:   1. Acute bronchitis, unspecified organism   Pt improving - cont augmentin until complete - I think that her current symptoms are more post-viral/post-bronchitis type cough - trx symptomatically and should resolve in 1-2 weeks.  Had decent sx improvement after alb neb in office so can try alb inh.  Meds ordered this encounter  Medications  . HYDROcodone-homatropine (HYCODAN) 5-1.5 MG/5ML syrup    Sig: Take 5 mLs by mouth every 6 (six) hours as needed for cough.    Dispense:  180 mL    Refill:  0  . albuterol (PROVENTIL) (2.5 MG/3ML) 0.083% nebulizer solution 2.5 mg    Sig:   . Albuterol Sulfate (PROAIR RESPICLICK) 108 (90 BASE) MCG/ACT AEPB    Sig: Inhale 2 puffs into the lungs every 4 (four) hours as needed.    Dispense:  1 each    Refill:  0    I personally performed the services described in this documentation, which was scribed in my presence. The recorded information has been reviewed and considered, and addended by me as needed.  [name], MD MPH

## 2015-10-02 NOTE — Patient Instructions (Signed)

## 2015-12-23 ENCOUNTER — Other Ambulatory Visit: Payer: Self-pay | Admitting: Family Medicine

## 2015-12-23 DIAGNOSIS — N63 Unspecified lump in unspecified breast: Secondary | ICD-10-CM

## 2016-01-09 ENCOUNTER — Ambulatory Visit (INDEPENDENT_AMBULATORY_CARE_PROVIDER_SITE_OTHER): Payer: BLUE CROSS/BLUE SHIELD | Admitting: Family Medicine

## 2016-01-09 ENCOUNTER — Encounter: Payer: Self-pay | Admitting: Family Medicine

## 2016-01-09 VITALS — BP 110/60 | HR 72 | Temp 97.9°F | Resp 16 | Ht 65.75 in | Wt 211.2 lb

## 2016-01-09 DIAGNOSIS — N3001 Acute cystitis with hematuria: Secondary | ICD-10-CM

## 2016-01-09 DIAGNOSIS — E559 Vitamin D deficiency, unspecified: Secondary | ICD-10-CM | POA: Diagnosis not present

## 2016-01-09 DIAGNOSIS — R635 Abnormal weight gain: Secondary | ICD-10-CM

## 2016-01-09 DIAGNOSIS — D509 Iron deficiency anemia, unspecified: Secondary | ICD-10-CM

## 2016-01-09 DIAGNOSIS — N912 Amenorrhea, unspecified: Secondary | ICD-10-CM

## 2016-01-09 LAB — POCT URINALYSIS DIP (MANUAL ENTRY)
Glucose, UA: NEGATIVE
Nitrite, UA: POSITIVE — AB
Protein Ur, POC: 100 — AB
Spec Grav, UA: 1.02
Urobilinogen, UA: 0.2
pH, UA: 8.5

## 2016-01-09 NOTE — Progress Notes (Addendum)
Subjective:    Patient ID: Deborah White, female    DOB: 08-Jan-1986, 30 y.o.   MRN: [id number] Chief Complaint  Patient presents with  . menstrual period    x 2 weeks late, but pt states she is on now as of 01/08/2016    HPI  Recently recurred that period was 2 wks late - when this happened sev yrs ago she was found to have a thyroid abnormality and took medicine for a while (in her country). When she moved here, she was told she didn't need the medication and thyroid tests have been normal but meses have also been normally.   Pt is not sexually active an has no h/o this.  Normally her breasts swell and become more tender over her menstrual cycle but that has not happened for hte last 2 mos.  Does get diarrhea with menses.  Menses was 2 wks late and just started back yesterday.  Pt just quit her job as she was working 6 pm to 4:30 a.m. shift and having to travel a ways for this so was just working and sleeping. Causing severe fatigue.  Continuing to gain weight.   She has gone back to school - she had a bachelor's degree in her country but now she has to re-earn degree to get decent job here - in [employer] - but was told that first her English has to improve further.   Loosing some hair but that more dramatically as well during prior abnml thyroid/amenorrhea time.  No skin change but increase in hirsuitism. Fatigue complicated and due to sev things.  Still taking iron, b complex, D occ, magnaese  Has f/u mammogram sched for this Mon.  Past Medical History  Diagnosis Date  . Thyroid disease   . Anemia    No past surgical history on file. Current Outpatient Prescriptions on File Prior to Visit  Medication Sig Dispense Refill  . B Complex Vitamins (VITAMIN B COMPLEX PO) Take by mouth once.    Marland Kitchen MAGNESIUM PO Take by mouth.    . Albuterol Sulfate (PROAIR RESPICLICK) 108 (90 BASE) MCG/ACT AEPB Inhale 2 puffs into the lungs every 4 (four) hours as needed. (Patient not taking:  Reported on 01/09/2016) 1 each 0  . ferrous fumarate (HEMOCYTE - 106 MG FE) 325 (106 FE) MG TABS tablet Take 1 tablet by mouth. Reported on 01/09/2016     No current facility-administered medications on file prior to visit.   No Known Allergies Family History  Problem Relation Age of Onset  . Breast cancer Mother   . Hypertension Mother   . Cancer Maternal Uncle     brain cancer   . Heart disease Maternal Grandmother    Social History   Social History  . Marital Status: Single    Spouse Name: N/A  . Number of Children: N/A  . Years of Education: N/A   Social History Main Topics  . Smoking status: Current Some Day Smoker  . Smokeless tobacco: Never Used  . Alcohol Use: No  . Drug Use: No  . Sexual Activity: Not Currently   Other Topics Concern  . None   Social History Narrative     Review of Systems  Constitutional: Positive for fatigue and unexpected weight change. Negative for fever, chills, activity change and appetite change.  Gastrointestinal: Negative for nausea, vomiting and abdominal pain.  Genitourinary: Positive for vaginal bleeding and menstrual problem. Negative for dysuria, flank pain, vaginal discharge and difficulty urinating.  Skin: Negative for color change, pallor and rash.  Neurological: Negative for dizziness and light-headedness.  Hematological: Negative for adenopathy.  Psychiatric/Behavioral: Positive for sleep disturbance. Negative for dysphoric mood. The patient is not nervous/anxious.        Objective:  BP 110/60 mmHg  Pulse 72  Temp(Src) 97.9 F (36.6 C) (Oral)  Resp 16  Ht 5' 5.75" (1.67 m)  Wt 211 lb 3.2 oz (95.8 kg)  BMI 34.35 kg/m2  SpO2 98%  LMP 01/08/2016  Physical Exam  Constitutional: She is oriented to person, place, and time. She appears well-developed and well-nourished. No distress.  HENT:  Head: Normocephalic and atraumatic.  Right Ear: External ear normal.  Left Ear: External ear normal.  Eyes: Conjunctivae are  normal. No scleral icterus.  Neck: Normal range of motion. Neck supple. No thyromegaly present.  Cardiovascular: Normal rate, regular rhythm, normal heart sounds and intact distal pulses.   Pulmonary/Chest: Effort normal and breath sounds normal. No respiratory distress.  Musculoskeletal: She exhibits no edema.  Lymphadenopathy:    She has no cervical adenopathy.  Neurological: She is alert and oriented to person, place, and time.  Skin: Skin is warm and dry. She is not diaphoretic. No erythema.  Psychiatric: She has a normal mood and affect. Her behavior is normal.          Assessment & Plan:   1. Amenorrhea - Recheck thyroid. Pt may have PCOS due to her c/o irregular menses, hirsuitism, and weight gain - check androgen levels. Cons pelvic US.    2. Weight gain - discussed options - pt really trying hard with diet and exercise.  May want to consider trial of metformin - if thyroid and androgen levels are normal, would consider trial of phenteramine for short term.  3. Vitamin D deficiency   4. Anemia, iron deficiency     Orders Placed This Encounter  Procedures  . Thyroid Panel With TSH  . DHEA-sulfate  . Testosterone, Free, Direct  . Prolactin  . Estradiol  . VITAMIN D 25 Hydroxy (Vit-D Deficiency, Fractures)  . Ferritin  . Testosterone, Free, LC/MS/MS  . POCT urinalysis dipstick    Meds ordered this encounter  Medications  . Vitamin D, Ergocalciferol, (DRISDOL) 50000 units CAPS capsule    Sig: Take 50,000 Units by mouth every 7 (seven) days.  . Ferrous Sulfate (IRON SUPPLEMENT PO)    Sig: Take by mouth.     [name], MD MPH

## 2016-01-09 NOTE — Patient Instructions (Addendum)
This is what we are testing for. . .. Polycystic Ovarian Syndrome Polycystic ovarian syndrome (PCOS) is a common hormonal disorder among women of reproductive age. Most women with PCOS grow many small cysts on their ovaries. PCOS can cause problems with your periods and make it difficult to get pregnant. It can also cause an increased risk of miscarriage with pregnancy. If left untreated, PCOS can lead to serious health problems, such as diabetes and heart disease. CAUSES The cause of PCOS is not fully understood, but genetics may be a factor. SIGNS AND SYMPTOMS   Infrequent or no menstrual periods.   Inability to get pregnant (infertility) because of not ovulating.   Increased growth of hair on the face, chest, stomach, back, thumbs, thighs, or toes.   Acne, oily skin, or dandruff.   Pelvic pain.   Weight gain or obesity, usually carrying extra weight around the waist.   Type 2 diabetes.   High cholesterol.   High blood pressure.   Female-pattern baldness or thinning hair.   Patches of thickened and dark brown or black skin on the neck, arms, breasts, or thighs.   Tiny excess flaps of skin (skin tags) in the armpits or neck area.   Excessive snoring and having breathing stop at times while asleep (sleep apnea).   Deepening of the voice.   Gestational diabetes when pregnant.  DIAGNOSIS  There is no single test to diagnose PCOS.   Your health care provider will:   Take a medical history.   Perform a pelvic exam.   Have ultrasonography done.   Check your female and female hormone levels.   Measure glucose or sugar levels in the blood.   Do other blood tests.   If you are producing too many female hormones, your health care provider will make sure it is from PCOS. At the physical exam, your health care provider will want to evaluate the areas of increased hair growth. Try to allow natural hair growth for a few days before the visit.   During a  pelvic exam, the ovaries may be enlarged or swollen because of the increased number of small cysts. This can be seen more easily by using vaginal ultrasonography or screening to examine the ovaries and lining of the uterus (endometrium) for cysts. The uterine lining may become thicker if you have not been having a regular period.  TREATMENT  Because there is no cure for PCOS, it needs to be managed to prevent problems. Treatments are based on your symptoms. Treatment is also based on whether you want to have a baby or whether you need contraception.  Treatment may include:   Progesterone hormone to start a menstrual period.   Birth control pills to make you have regular menstrual periods.   Medicines to make you ovulate, if you want to get pregnant.   Medicines to control your insulin.   Medicine to control your blood pressure.   Medicine and diet to control your high cholesterol and triglycerides in your blood.  Medicine to reduce excessive hair growth.  Surgery, making small holes in the ovary, to decrease the amount of female hormone production. This is done through a long, lighted tube (laparoscope) placed into the pelvis through a tiny incision in the lower abdomen.  HOME CARE INSTRUCTIONS  Only take over-the-counter or prescription medicine as directed by your health care provider.  Pay attention to the foods you eat and your activity levels. This can help reduce the effects of PCOS.  Keep  your weight under control.  Eat foods that are low in carbohydrate and high in fiber.  Exercise regularly. SEEK MEDICAL CARE IF:  Your symptoms do not get better with medicine.  You have new symptoms.   This information is not intended to replace advice given to you by your health care provider. Make sure you discuss any questions you have with your health care provider.   Document Released: 03/19/2005 Document Revised: 09/13/2013 Document Reviewed: 05/11/2013 Elsevier  Interactive Patient Education [employer]

## 2016-01-10 ENCOUNTER — Other Ambulatory Visit: Payer: Self-pay | Admitting: Family Medicine

## 2016-01-10 DIAGNOSIS — N63 Unspecified lump in unspecified breast: Secondary | ICD-10-CM

## 2016-01-10 DIAGNOSIS — Z87898 Personal history of other specified conditions: Secondary | ICD-10-CM

## 2016-01-10 LAB — PROLACTIN: Prolactin: 8.5 ng/mL

## 2016-01-10 LAB — ESTRADIOL: Estradiol: 27.2 pg/mL

## 2016-01-10 LAB — VITAMIN D 25 HYDROXY (VIT D DEFICIENCY, FRACTURES): Vit D, 25-Hydroxy: 20 ng/mL — ABNORMAL LOW (ref 30–100)

## 2016-01-10 LAB — FERRITIN: Ferritin: 32 ng/mL (ref 10–291)

## 2016-01-10 LAB — DHEA-SULFATE: DHEA-SO4: 312 ug/dL (ref 18–391)

## 2016-01-10 LAB — THYROID PANEL WITH TSH
Free Thyroxine Index: 2.1 (ref 1.4–3.8)
T3 Uptake: 34 % (ref 22–35)
T4, Total: 6.1 ug/dL (ref 4.5–12.0)
TSH: 2.402 u[IU]/mL (ref 0.350–4.500)

## 2016-01-13 ENCOUNTER — Other Ambulatory Visit: Payer: Self-pay | Admitting: Family Medicine

## 2016-01-13 ENCOUNTER — Inpatient Hospital Stay: Admission: RE | Admit: 2016-01-13 | Payer: Medicaid Other | Source: Ambulatory Visit

## 2016-01-13 ENCOUNTER — Ambulatory Visit
Admission: RE | Admit: 2016-01-13 | Discharge: 2016-01-13 | Disposition: A | Discharge status: Home / Self Care | Payer: BLUE CROSS/BLUE SHIELD | Source: Ambulatory Visit | Attending: Family Medicine | Admitting: Family Medicine

## 2016-01-13 DIAGNOSIS — Z803 Family history of malignant neoplasm of breast: Secondary | ICD-10-CM

## 2016-01-13 DIAGNOSIS — R928 Other abnormal and inconclusive findings on diagnostic imaging of breast: Secondary | ICD-10-CM

## 2016-01-13 DIAGNOSIS — Z87898 Personal history of other specified conditions: Secondary | ICD-10-CM

## 2016-01-13 DIAGNOSIS — Z1231 Encounter for screening mammogram for malignant neoplasm of breast: Secondary | ICD-10-CM

## 2016-01-13 DIAGNOSIS — N63 Unspecified lump in unspecified breast: Secondary | ICD-10-CM

## 2016-01-13 MED ORDER — NITROFURANTOIN MONOHYD MACRO 100 MG PO CAPS: 100.0000 mg | ORAL_CAPSULE | Freq: Two times a day (BID) | ORAL | Status: DC

## 2016-01-13 NOTE — Addendum Note (Signed)
Addended by: [name] on: 01/13/2016 09:24 AM   Modules accepted: Orders

## 2016-01-13 NOTE — Addendum Note (Signed)
Addended by: Eddie Candle on: 01/13/2016 09:05 AM   Modules accepted: Orders

## 2016-01-14 LAB — TESTOSTERONE, FREE, LC/MS/MS: Testosterone, Free, LCM/MS/MS: 3.9 pg/mL (ref 0.2–5.0)

## 2016-01-31 DIAGNOSIS — Z803 Family history of malignant neoplasm of breast: Secondary | ICD-10-CM | POA: Insufficient documentation

## 2016-02-06 ENCOUNTER — Telehealth: Payer: Self-pay | Admitting: Family Medicine

## 2016-02-06 MED ORDER — METFORMIN HCL 500 MG PO TABS: 500.0000 mg | ORAL_TABLET | Freq: Two times a day (BID) | ORAL | Status: DC

## 2016-02-06 NOTE — Telephone Encounter (Signed)
Here with her sister today - helping translate at her sister's visit.  Both her and her sister Aseel have a lot of the symptoms of PCOS. I am going to start them both on metformin. I am also trying her sister on OCPs as periods are long and heavy so we may want to add this into Crisol's regimen as well.

## 2016-04-28 ENCOUNTER — Ambulatory Visit (INDEPENDENT_AMBULATORY_CARE_PROVIDER_SITE_OTHER): Payer: BLUE CROSS/BLUE SHIELD | Admitting: Family Medicine

## 2016-04-28 VITALS — BP 122/80 | HR 80 | Temp 97.6°F | Resp 18 | Ht 65.75 in | Wt 204.0 lb

## 2016-04-28 DIAGNOSIS — Z30011 Encounter for initial prescription of contraceptive pills: Secondary | ICD-10-CM

## 2016-04-28 DIAGNOSIS — Z309 Encounter for contraceptive management, unspecified: Secondary | ICD-10-CM | POA: Diagnosis not present

## 2016-04-28 MED ORDER — NORGESTIMATE-ETH ESTRADIOL 0.25-35 MG-MCG PO TABS: 1.0000 | ORAL_TABLET | Freq: Every day | ORAL | Status: DC

## 2016-04-28 MED ORDER — METFORMIN HCL 500 MG PO TABS: 500.0000 mg | ORAL_TABLET | Freq: Two times a day (BID) | ORAL | Status: DC

## 2016-04-28 NOTE — Progress Notes (Signed)
Subjective:  By signing my name below, I, Stann Ore, attest that this documentation has been prepared under the direction and in the presence of [name], MD. Electronically Signed: Stann Ore, Scribe. 04/28/2016 , 10:38 AM .  Patient was seen in Room 10 .   Patient ID: Deborah White, female    DOB: [birth year], 30 y.o.   MRN: [id number] Chief Complaint  Patient presents with  . Contraception   HPI Deborah White is a 30 y.o. female who presents to [facility] with discussion of contraception. Patient was started on metformin  bid almost 3 months ago. Patient had weight gain and severe fatigue and recently developed metrorrhagia and losing some hair as well. She had an increased in hirsutism. H/o unknown thyroid abnormality in her country, PCOS believe due to irregular menses and hirsutism. Her androgen and estrogen levels were normal, thyroid normal, vitamin D was improving, and her iron was on the low side of normal. Her last A1c was 5.2 a year prior. I'm also seeing patient's sister who has very similar symptoms who was also started on metformin. Patient's sister started on OCP's for menorrhagia as well, and if her sister did well, patient may try this in her regime. Patient would likely be safe candidate for phentermine, and check pelvic US if menstrual abnormalities continue.   Patient states that she's doing well on the medication. She has some diarrhea but denies abdominal pain. She's been working on her diet and working out. Her periods have been fairly normal. She is also still taking the iron supplements. She does mention feeling shaky and having sweats. She finds relief if she eats a small piece of candy or snack.   She will resume school in August.   Past Medical History  Diagnosis Date  . Thyroid disease   . Anemia    Prior to Admission medications   Medication Sig Start Date End Date Taking? Authorizing Provider  B Complex Vitamins (VITAMIN B COMPLEX PO) Take by  mouth once.   Yes Historical Provider, MD  Ferrous Sulfate (IRON SUPPLEMENT PO) Take by mouth. Reported on 04/28/2016   Yes Historical Provider, MD  MAGNESIUM PO Take by mouth.   Yes Historical Provider, MD  metFORMIN (GLUCOPHAGE) 500 MG tablet Take 1 tablet (500 mg total) by mouth 2 (two) times daily with a meal. 02/06/16  Yes Sherren Mocha, MD  Albuterol Sulfate (PROAIR RESPICLICK) 108 (90 BASE) MCG/ACT AEPB Inhale 2 puffs into the lungs every 4 (four) hours as needed. Patient not taking: Reported on 04/28/2016 10/02/15   Sherren Mocha, MD  ferrous fumarate (HEMOCYTE - 106 MG FE) 325 (106 FE) MG TABS tablet Take 1 tablet by mouth. Reported on 04/28/2016    Historical Provider, MD  nitrofurantoin, macrocrystal-monohydrate, (MACROBID) 100 MG capsule Take 1 capsule (100 mg total) by mouth 2 (two) times daily. Patient not taking: Reported on 04/28/2016 01/13/16   Sherren Mocha, MD  Vitamin D, Ergocalciferol, (DRISDOL) 50000 units CAPS capsule Take 50,000 Units by mouth every 7 (seven) days. Reported on 04/28/2016    Historical Provider, MD   No Known Allergies  Review of Systems  Constitutional: Positive for diaphoresis. Negative for fever, chills, appetite change and fatigue.  Cardiovascular: Negative for leg swelling.  Gastrointestinal: Positive for diarrhea. Negative for nausea, vomiting and abdominal pain.  Genitourinary: Positive for menstrual problem.  Neurological: Negative for dizziness and headaches.       Objective:   Physical Exam  Constitutional: She is oriented to  person, place, and time. She appears well-developed and well-nourished. No distress.  HENT:  Head: Normocephalic and atraumatic.  Eyes: EOM are normal. Pupils are equal, round, and reactive to light.  Neck: Neck supple.  Cardiovascular: Normal rate.   Pulmonary/Chest: Effort normal. No respiratory distress.  Musculoskeletal: Normal range of motion.  Neurological: She is alert and oriented to person, place, and time.  Skin: Skin is  warm and dry.  Psychiatric: She has a normal mood and affect. Her behavior is normal.  Nursing note and vitals reviewed.   BP 122/80 mmHg  Pulse 80  Temp(Src) 97.6 F (36.4 C) (Oral)  Resp 18  Ht 5' 5.75" (1.67 m)  Wt 204 lb (92.534 kg)  BMI 33.18 kg/m2  SpO2 99%  LMP 04/28/2016    Assessment & Plan:   1. Family planning, BCP (birth control pills) initial prescription   Refilled metformin - trying it for PCOS like sxs and weight is responding.  Meds ordered this encounter  Medications  . norgestimate-ethinyl estradiol (ORTHO-CYCLEN,SPRINTEC,PREVIFEM) 0.25-35 MG-MCG tablet    Sig: Take 1 tablet by mouth daily.    Dispense:  3 Package    Refill:  4  . metFORMIN (GLUCOPHAGE) 500 MG tablet    Sig: Take 1 tablet (500 mg total) by mouth 2 (two) times daily with a meal.    Dispense:  180 tablet    Refill:  3    I personally performed the services described in this documentation, which was scribed in my presence. The recorded information has been reviewed and considered, and addended by me as needed.  Norberto SorensonEva Shaw, MD MPH

## 2016-04-28 NOTE — Patient Instructions (Addendum)
If you have any questions or concerns, don't hesitate to call. Let me know if you have any side effects. If you miss pills and do not use a condom, you can also get plan B over the counter - take it as soon as possible - it is like taking a bunch of birth control pills at once and will NOT cause a miscarriage or abortion if you have already conceived.    IF you received an x-ray today, you will receive an invoice from [facility]. Please contact [facility] at [phone] with questions or concerns regarding your invoice.   IF you received labwork today, you will receive an invoice from [employer]. Please contact [facility] at [phone] with questions or concerns regarding your invoice.   Our billing staff will not be able to assist you with questions regarding bills from these companies.  You will be contacted with the lab results as soon as they are available. The fastest way to get your results is to activate your My Chart account. Instructions are located on the last page of this paperwork. If you have not heard from [place] regarding the results in 2 weeks, please contact this office.    Oral Contraception Use Oral contraceptive pills (OCPs) are medicines taken to prevent pregnancy. OCPs work by preventing the ovaries from releasing eggs. The hormones in OCPs also cause the cervical mucus to thicken, preventing the sperm from entering the uterus. The hormones also cause the uterine lining to become thin, not allowing a fertilized egg to attach to the inside of the uterus. OCPs are highly effective when taken exactly as prescribed. However, OCPs do not prevent sexually transmitted diseases (STDs). Safe sex practices, such as using condoms along with an OCP, can help prevent STDs. Before taking OCPs, you may have a physical exam and Pap test. Your health care provider may also order blood tests if necessary. Your health care provider will make sure you  are a good candidate for oral contraception. Discuss with your health care provider the possible side effects of the OCP you may be prescribed. When starting an OCP, it can take 2 to 3 months for the body to adjust to the changes in hormone levels in your body.  HOW TO TAKE ORAL CONTRACEPTIVE PILLS Your health care provider may advise you on how to start taking the first cycle of OCPs. Otherwise, you can:   Start on day 1 of your menstrual period. You will not need any backup contraceptive protection with this start time.   Start on the first Sunday after your menstrual period or the day you get your prescription. In these cases, you will need to use backup contraceptive protection for the first week.   Start the pill at any time of your cycle. If you take the pill within 5 days of the start of your period, you are protected against pregnancy right away. In this case, you will not need a backup form of birth control. If you start at any other time of your menstrual cycle, you will need to use another form of birth control for 7 days. If your OCP is the type called a minipill, it will protect you from pregnancy after taking it for 2 days (48 hours). After you have started taking OCPs:   If you forget to take 1 pill, take it as soon as you remember. Take the next pill at the regular time.   If you miss 2 or more pills, call  your health care provider because different pills have different instructions for missed doses. Use backup birth control until your next menstrual period starts.   If you use a 28-day pack that contains inactive pills and you miss 1 of the last 7 pills (pills with no hormones), it will not matter. Throw away the rest of the non-hormone pills and start a new pill pack.  No matter which day you start the OCP, you will always start a new pack on that same day of the week. Have an extra pack of OCPs and a backup contraceptive method available in case you miss some pills or lose  your OCP pack.  HOME CARE INSTRUCTIONS   Do not smoke.   Always use a condom to protect against STDs. OCPs do not protect against STDs.   Use a calendar to mark your menstrual period days.   Read the information and directions that came with your OCP. Talk to your health care provider if you have questions.  SEEK MEDICAL CARE IF:   You develop nausea and vomiting.   You have abnormal vaginal discharge or bleeding.   You develop a rash.   You miss your menstrual period.   You are losing your hair.   You need treatment for mood swings or depression.   You get dizzy when taking the OCP.   You develop acne from taking the OCP.   You become pregnant.  SEEK IMMEDIATE MEDICAL CARE IF:   You develop chest pain.   You develop shortness of breath.   You have an uncontrolled or severe headache.   You develop numbness or slurred speech.   You develop visual problems.   You develop pain, redness, and swelling in the legs.    This information is not intended to replace advice given to you by your health care provider. Make sure you discuss any questions you have with your health care provider.   Document Released: 11/12/2011 Document Revised: 12/14/2014 Document Reviewed: 05/14/2013 Elsevier Interactive Patient Education 2016 [employer]  Oral Contraception Information Oral contraceptive pills (OCPs) are medicines taken to prevent pregnancy. OCPs work by preventing the ovaries from releasing eggs. The hormones in OCPs also cause the cervical mucus to thicken, preventing the sperm from entering the uterus. The hormones also cause the uterine lining to become thin, not allowing a fertilized egg to attach to the inside of the uterus. OCPs are highly effective when taken exactly as prescribed. However, OCPs do not prevent sexually transmitted diseases (STDs). Safe sex practices, such as using condoms along with the pill, can help prevent STDs.  Before taking the  pill, you may have a physical exam and Pap test. Your health care provider may order blood tests. The health care provider will make sure you are a good candidate for oral contraception. Discuss with your health care provider the possible side effects of the OCP you may be prescribed. When starting an OCP, it can take 2 to 3 months for the body to adjust to the changes in hormone levels in your body.  TYPES OF ORAL CONTRACEPTION  The combination pill--This pill contains estrogen and progestin (synthetic progesterone) hormones. The combination pill comes in 21-day, 28-day, or 91-day packs. Some types of combination pills are meant to be taken continuously (365-day pills). With 21-day packs, you do not take pills for 7 days after the last pill. With 28-day packs, the pill is taken every day. The last 7 pills are without hormones. Certain types of pills  have more than 21 hormone-containing pills. With 91-day packs, the first 84 pills contain both hormones, and the last 7 pills contain no hormones or contain estrogen only.  The minipill--This pill contains the progesterone hormone only. The pill is taken every day continuously. It is very important to take the pill at the same time each day. The minipill comes in packs of 28 pills. All 28 pills contain the hormone.  ADVANTAGES OF ORAL CONTRACEPTIVE PILLS  Decreases premenstrual symptoms.   Treats menstrual period cramps.   Regulates the menstrual cycle.   Decreases a heavy menstrual flow.   May treatacne, depending on the type of pill.   Treats abnormal uterine bleeding.   Treats polycystic ovarian syndrome.   Treats endometriosis.   Can be used as emergency contraception.  THINGS THAT CAN MAKE ORAL CONTRACEPTIVE PILLS LESS EFFECTIVE OCPs can be less effective if:   You forget to take the pill at the same time every day.   You have a stomach or intestinal disease that lessens the absorption of the pill.   You take OCPs with  other medicines that make OCPs less effective, such as antibiotics, certain HIV medicines, and some seizure medicines.   You take expired OCPs.   You forget to restart the pill on day 7, when using the packs of 21 pills.  RISKS ASSOCIATED WITH ORAL CONTRACEPTIVE PILLS  Oral contraceptive pills can sometimes cause side effects, such as:  Headache.  Nausea.  Breast tenderness.  Irregular bleeding or spotting. Combination pills are also associated with a small increased risk of:  Blood clots.  Heart attack.  Stroke.   This information is not intended to replace advice given to you by your health care provider. Make sure you discuss any questions you have with your health care provider.   Document Released: 02/13/2003 Document Revised: 09/13/2013 Document Reviewed: 05/14/2013 Elsevier Interactive Patient Education 2016 [employer]. Contraception Choices Contraception (birth control) is the use of any methods or devices to prevent pregnancy. Below are some methods to help avoid pregnancy. HORMONAL METHODS   Contraceptive implant. This is a thin, plastic tube containing progesterone hormone. It does not contain estrogen hormone. Your health care provider inserts the tube in the inner part of the upper arm. The tube can remain in place for up to 3 years. After 3 years, the implant must be removed. The implant prevents the ovaries from releasing an egg (ovulation), thickens the cervical mucus to prevent sperm from entering the uterus, and thins the lining of the inside of the uterus.  Progesterone-only injections. These injections are given every 3 months by your health care provider to prevent pregnancy. This synthetic progesterone hormone stops the ovaries from releasing eggs. It also thickens cervical mucus and changes the uterine lining. This makes it harder for sperm to survive in the uterus.  Birth control pills. These pills contain estrogen and progesterone hormone. They work  by preventing the ovaries from releasing eggs (ovulation). They also cause the cervical mucus to thicken, preventing the sperm from entering the uterus. Birth control pills are prescribed by a health care provider.Birth control pills can also be used to treat heavy periods.  Minipill. This type of birth control pill contains only the progesterone hormone. They are taken every day of each month and must be prescribed by your health care provider.  Birth control patch. The patch contains hormones similar to those in birth control pills. It must be changed once a week and is prescribed by a  health care provider.  Vaginal ring. The ring contains hormones similar to those in birth control pills. It is left in the vagina for 3 weeks, removed for 1 week, and then a new one is put back in place. The patient must be comfortable inserting and removing the ring from the vagina.A health care provider's prescription is necessary.  Emergency contraception. Emergency contraceptives prevent pregnancy after unprotected sexual intercourse. This pill can be taken right after sex or up to 5 days after unprotected sex. It is most effective the sooner you take the pills after having sexual intercourse. Most emergency contraceptive pills are available without a prescription. Check with your pharmacist. Do not use emergency contraception as your only form of birth control. BARRIER METHODS   Female condom. This is a thin sheath (latex or rubber) that is worn over the penis during sexual intercourse. It can be used with spermicide to increase effectiveness.  Female condom. This is a soft, loose-fitting sheath that is put into the vagina before sexual intercourse.  Diaphragm. This is a soft, latex, dome-shaped barrier that must be fitted by a health care provider. It is inserted into the vagina, along with a spermicidal jelly. It is inserted before intercourse. The diaphragm should be left in the vagina for 6 to 8 hours after  intercourse.  Cervical cap. This is a round, soft, latex or plastic cup that fits over the cervix and must be fitted by a health care provider. The cap can be left in place for up to 48 hours after intercourse.  Sponge. This is a soft, circular piece of polyurethane foam. The sponge has spermicide in it. It is inserted into the vagina after wetting it and before sexual intercourse.  Spermicides. These are chemicals that kill or block sperm from entering the cervix and uterus. They come in the form of creams, jellies, suppositories, foam, or tablets. They do not require a prescription. They are inserted into the vagina with an applicator before having sexual intercourse. The process must be repeated every time you have sexual intercourse. INTRAUTERINE CONTRACEPTION  Intrauterine device (IUD). This is a T-shaped device that is put in a woman's uterus during a menstrual period to prevent pregnancy. There are 2 types:  Copper IUD. This type of IUD is wrapped in copper wire and is placed inside the uterus. Copper makes the uterus and fallopian tubes produce a fluid that kills sperm. It can stay in place for 10 years.  Hormone IUD. This type of IUD contains the hormone progestin (synthetic progesterone). The hormone thickens the cervical mucus and prevents sperm from entering the uterus, and it also thins the uterine lining to prevent implantation of a fertilized egg. The hormone can weaken or kill the sperm that get into the uterus. It can stay in place for 3-5 years, depending on which type of IUD is used. PERMANENT METHODS OF CONTRACEPTION  Female tubal ligation. This is when the woman's fallopian tubes are surgically sealed, tied, or blocked to prevent the egg from traveling to the uterus.  Hysteroscopic sterilization. This involves placing a small coil or insert into each fallopian tube. Your doctor uses a technique called hysteroscopy to do the procedure. The device causes scar tissue to form. This  results in permanent blockage of the fallopian tubes, so the sperm cannot fertilize the egg. It takes about 3 months after the procedure for the tubes to become blocked. You must use another form of birth control for these 3 months.  Female sterilization. This  is when the female has the tubes that carry sperm tied off (vasectomy).This blocks sperm from entering the vagina during sexual intercourse. After the procedure, the man can still ejaculate fluid (semen). NATURAL PLANNING METHODS  Natural family planning. This is not having sexual intercourse or using a barrier method (condom, diaphragm, cervical cap) on days the woman could become pregnant.  Calendar method. This is keeping track of the length of each menstrual cycle and identifying when you are fertile.  Ovulation method. This is avoiding sexual intercourse during ovulation.  Symptothermal method. This is avoiding sexual intercourse during ovulation, using a thermometer and ovulation symptoms.  Post-ovulation method. This is timing sexual intercourse after you have ovulated. Regardless of which type or method of contraception you choose, it is important that you use condoms to protect against the transmission of sexually transmitted infections (STIs). Talk with your health care provider about which form of contraception is most appropriate for you.   This information is not intended to replace advice given to you by your health care provider. Make sure you discuss any questions you have with your health care provider.   Document Released: 11/23/2005 Document Revised: 11/28/2013 Document Reviewed: 05/18/2013 Elsevier Interactive Patient Education [employer] Emergency Contraception Emergency contraceptives prevent pregnancy after unprotected sexual intercourse. They can also be used:  When a condom breaks.  After a sexual assault.  If you forgot to take your birth control pills.  When inadequate protection occurs with sexual  intercourse. Usually, emergency contraception is a pill or combination of pills taken right after sex or up to 5 days after unprotected sex. It is most effective the sooner you take the pills after having sexual intercourse. Most types of emergency contraceptive pills are available without a prescription. One type requires a prescription from your health care provider. Also, young women under age 83 need a prescription for most types of emergency contraception. Check with your pharmacist. Do not use emergency contraception as your only form of birth control. These pills do not protect against sexually transmitted infections (STIs).  Emergency contraception will not work if you are already pregnant and will not harm the baby if you are pregnant. Emergency contraception does not cause an abortion. The pills work by preventing the ovaries from releasing an egg (ovulation) or the fertilization of an egg. Taking St. John's wort, certain antibiotic medicines, and certain anticonvulsant medicines may make these pills less effective. Discuss with your health care provider the possible side effects of emergency contraceptives. These may include:  Abdominal pain and cramps.  Breast tenderness.  Headache.  Dizziness.  Fatigue.  Irregular bleeding or spotting. TYPES OF EMERGENCY CONTRACEPTIVES  Some types of emergency contraceptive pills contain estrogen and progesterone in higher doses.  Some types just contain progesterone. They are available as a single pill or two pills taken 12-24 hours apart.  One type of pill is not a hormone. It prevents the hormone progesterone from having its normal effect on ovulation and the lining of the uterus.  An intrauterine device (IUD) may be used.This T-shaped device is also used as a form of birth control. It is inserted into the uterus to prevent pregnancy. The copper IUD can also be used as emergency contraception if inserted within 5 days of having unprotected  intercourse. HOME CARE INSTRUCTIONS   Eat something before taking the emergency contraceptive pills.  Lie down for a couple of hours if you become tired or dizzy.  Continue using birth control until you start your  menstrual period. SEEK MEDICAL CARE IF:   You throw up (vomit) within 2 hours after taking the pill. You will have to take another pill.  You need treatment for nausea, vomiting, headache, or abdominal cramps.  You have not had a menstrual period 21 days after taking the pill.  You are having irregular bleeding or spotting. SEEK IMMEDIATE MEDICAL CARE IF:   You have chest pain.  You have leg pain.  You have numbness or weakness of your arms or legs.  You have slurred speech.  You have visual problems.   This information is not intended to replace advice given to you by your health care provider. Make sure you discuss any questions you have with your health care provider.   Document Released: 02/01/2002 Document Revised: 12/14/2014 Document Reviewed: 05/07/2013 Elsevier Interactive Patient Education [employer]

## 2016-06-18 ENCOUNTER — Other Ambulatory Visit: Payer: Self-pay | Admitting: Family Medicine

## 2016-06-18 DIAGNOSIS — N6310 Unspecified lump in the right breast, unspecified quadrant: Secondary | ICD-10-CM

## 2016-07-06 ENCOUNTER — Ambulatory Visit
Admission: RE | Admit: 2016-07-06 | Discharge: 2016-07-06 | Disposition: A | Discharge status: Home / Self Care | Payer: BLUE CROSS/BLUE SHIELD | Source: Ambulatory Visit | Attending: Family Medicine | Admitting: Family Medicine

## 2016-07-06 DIAGNOSIS — N6310 Unspecified lump in the right breast, unspecified quadrant: Secondary | ICD-10-CM

## 2016-08-08 ENCOUNTER — Ambulatory Visit (INDEPENDENT_AMBULATORY_CARE_PROVIDER_SITE_OTHER): Payer: BLUE CROSS/BLUE SHIELD | Admitting: Family Medicine

## 2016-08-08 VITALS — BP 102/60 | HR 67 | Temp 98.7°F | Resp 16 | Ht 65.75 in | Wt 196.0 lb

## 2016-08-08 DIAGNOSIS — G44219 Episodic tension-type headache, not intractable: Secondary | ICD-10-CM | POA: Diagnosis not present

## 2016-08-08 DIAGNOSIS — H1189 Other specified disorders of conjunctiva: Secondary | ICD-10-CM

## 2016-08-08 MED ORDER — GENTAMICIN SULFATE 0.3 % OP SOLN: 1.0000 [drp] | OPHTHALMIC | 0 refills | Status: DC

## 2016-08-08 NOTE — Patient Instructions (Addendum)
IF you received an x-ray today, you will receive an invoice from [facility]. Please contact [facility] at [phone] with questions or concerns regarding your invoice.   IF you received labwork today, you will receive an invoice from [employer]. Please contact [facility] at [phone] with questions or concerns regarding your invoice.   Our billing staff will not be able to assist you with questions regarding bills from these companies.  You will be contacted with the lab results as soon as they are available. The fastest way to get your results is to activate your My Chart account. Instructions are located on the last page of this paperwork. If you have not heard from [place] regarding the results in 2 weeks, please contact this office.     Corneal Abrasion The cornea is the clear covering at the front and center of the eye. When looking at the colored portion of the eye (iris), you are looking through the cornea. This very thin tissue is made up of many layers. The surface layer is a single layer of cells (corneal epithelium) and is one of the most sensitive tissues in the body. If a scratch or injury causes the corneal epithelium to come off, it is called a corneal abrasion. If the injury extends to the tissues below the epithelium, the condition is called a corneal ulcer. CAUSES   Scratches.  Trauma.  Foreign body in the eye. Some people have recurrences of abrasions in the area of the original injury even after it has healed (recurrent erosion syndrome). Recurrent erosion syndrome generally improves and goes away with time. SYMPTOMS   Eye pain.  Difficulty or inability to keep the injured eye open.  The eye becomes very sensitive to light.  Recurrent erosions tend to happen suddenly, first thing in the morning, usually after waking up and opening the eye. DIAGNOSIS  Your health care provider can diagnose a corneal abrasion during  an eye exam. Dye is usually placed in the eye using a drop or a small paper strip moistened by your tears. When the eye is examined with a special light, the abrasion shows up clearly because of the dye. TREATMENT   Small abrasions may be treated with antibiotic drops or ointment alone.  A pressure patch may be put over the eye. If this is done, follow your doctor's instructions for when to remove the patch. Do not drive or use machines while the eye patch is on. Judging distances is hard to do with a patch on. If the abrasion becomes infected and spreads to the deeper tissues of the cornea, a corneal ulcer can result. This is serious because it can cause corneal scarring. Corneal scars interfere with light passing through the cornea and cause a loss of vision in the involved eye. HOME CARE INSTRUCTIONS  Use medicine or ointment as directed. Only take over-the-counter or prescription medicines for pain, discomfort, or fever as directed by your health care provider.  Do not drive or operate machinery if your eye is patched. Your ability to judge distances is impaired.  If your health care provider has given you a follow-up appointment, it is very important to keep that appointment. Not keeping the appointment could result in a severe eye infection or permanent loss of vision. If there is any problem keeping the appointment, let your health care provider know. SEEK MEDICAL CARE IF:   You have pain, light sensitivity, and a scratchy feeling in one eye or both  eyes.  Your pressure patch keeps loosening up, and you can blink your eye under the patch after treatment.  Any kind of discharge develops from the eye after treatment or if the lids stick together in the morning.  You have the same symptoms in the morning as you did with the original abrasion days, weeks, or months after the abrasion healed.   This information is not intended to replace advice given to you by your health care provider.  Make sure you discuss any questions you have with your health care provider.   Document Released: 11/20/2000 Document Revised: 08/14/2015 Document Reviewed: 07/31/2013 Elsevier Interactive Patient Education 2016 Elsevier Inc.  Tension Headache A tension headache is a feeling of pain, pressure, or aching that is often felt over the front and sides of the head. The pain can be dull, or it can feel tight (constricting). Tension headaches are not normally associated with nausea or vomiting, and they do not get worse with physical activity. Tension headaches can last from 30 minutes to several days. This is the most common type of headache. CAUSES The exact cause of this condition is not known. Tension headaches often begin after stress, anxiety, or depression. Other triggers may include:  Alcohol.  Too much caffeine, or caffeine withdrawal.  Respiratory infections, such as colds, flu, or sinus infections.  Dental problems or teeth clenching.  Fatigue.  Holding your head and neck in the same position for a long period of time, such as while using a computer.  Smoking. SYMPTOMS Symptoms of this condition include:  A feeling of pressure around the head.  Dull, aching head pain.  Pain felt over the front and sides of the head.  Tenderness in the muscles of the head, neck, and shoulders. DIAGNOSIS This condition may be diagnosed based on your symptoms and a physical exam. Tests may be done, such as a CT scan or an MRI of your head. These tests may be done if your symptoms are severe or unusual. TREATMENT This condition may be treated with lifestyle changes and medicines to help relieve symptoms. HOME CARE INSTRUCTIONS Managing Pain  Take over-the-counter and prescription medicines only as told by your health care provider.  Lie down in a dark, quiet room when you have a headache.  If directed, apply ice to the head and neck area:  Put ice in a plastic bag.  Place a towel  between your skin and the bag.  Leave the ice on for 20 minutes, 2-3 times per day.  Use a heating pad or a hot shower to apply heat to the head and neck area as told by your health care provider. Eating and Drinking  Eat meals on a regular schedule.  Limit alcohol use.  Decrease your caffeine intake, or stop using caffeine. General Instructions  Keep all follow-up visits as told by your health care provider. This is important.  Keep a headache journal to help find out what may trigger your headaches. For example, write down:  What you eat and drink.  How much sleep you get.  Any change to your diet or medicines.  Try massage or other relaxation techniques.  Limit stress.  Sit up straight, and avoid tensing your muscles.  Do not use tobacco products, including cigarettes, chewing tobacco, or e-cigarettes. If you need help quitting, ask your health care provider.  Exercise regularly as told by your health care provider.  Get 7-9 hours of sleep, or the amount recommended by your health care provider.  SEEK MEDICAL CARE IF:  Your symptoms are not helped by medicine.  You have a headache that is different from what you normally experience.  You have nausea or you vomit.  You have a fever. SEEK IMMEDIATE MEDICAL CARE IF:  Your headache becomes severe.  You have repeated vomiting.  You have a stiff neck.  You have a loss of vision.  You have problems with speech.  You have pain in your eye or ear.  You have muscular weakness or loss of muscle control.  You lose your balance or you have trouble walking.  You feel faint or you pass out.  You have confusion.   This information is not intended to replace advice given to you by your health care provider. Make sure you discuss any questions you have with your health care provider.   Document Released: 11/23/2005 Document Revised: 08/14/2015 Document Reviewed: 03/18/2015 Elsevier Interactive Patient Education  [employer]

## 2016-08-11 NOTE — Progress Notes (Signed)
Subjective:    Patient ID: Deborah White, female    DOB: 01/27/1986, 30 y.o.   MRN: [id number] By signing my name below, I, Javier Dockerobert Ryan Halas, attest that this documentation has been prepared under the direction and in the presence of Norberto SorensonEva Racquel Arkin, MD. Electronically Signed: Javier Dockerobert Ryan Halas, ER Scribe. 08/08/2016. 11:01 AM.  Chief Complaint  Patient presents with  . Other    Red spot on right eye and frequent headache   . Other    questions about OCPS    HPI HPI Comments: Deborah White is a 30 y.o. female [facility] of a red spot on her eye with associated pain, lid swelling and itching for the last 72 hours with associated bilateral parietal HA. She denies visual disturbance. She believes she did have something caught in her eye a couple of days ago. School just started again. She denies photophobia, or phonophobia. She does not drink caffeine. She does not drink much water. She likes her birth control but states she is still concerned that she could get pregnant. She occasionally forgets to take her birth control for one day, but always takes both the following day when she remembers.    Past Medical History:  Diagnosis Date  . Anemia   . Thyroid disease    No Known Allergies  Current Outpatient Prescriptions on File Prior to Visit  Medication Sig Dispense Refill  . B Complex Vitamins (VITAMIN B COMPLEX PO) Take by mouth once.    . ferrous fumarate (HEMOCYTE - 106 MG FE) 325 (106 FE) MG TABS tablet Take 1 tablet by mouth. Reported on 04/28/2016    . Ferrous Sulfate (IRON SUPPLEMENT PO) Take by mouth. Reported on 04/28/2016    . metFORMIN (GLUCOPHAGE) 500 MG tablet Take 1 tablet (500 mg total) by mouth 2 (two) times daily with a meal. 180 tablet 3  . norgestimate-ethinyl estradiol (ORTHO-CYCLEN,SPRINTEC,PREVIFEM) 0.25-35 MG-MCG tablet Take 1 tablet by mouth daily. 3 Package 4  . MAGNESIUM PO Take by mouth.     No current facility-administered medications  on file prior to visit.    Depression screen [facility] 2/9 04/28/2016 01/09/2016 10/02/2015 09/27/2015 09/24/2015  Decreased Interest 0 0 0 0 0  Down, Depressed, Hopeless 0 0 0 0 0  PHQ - 2 Score 0 0 0 0 0  Altered sleeping - - - 0 -  Tired, decreased energy - - - 0 -  Change in appetite - - - 0 -  Feeling bad or failure about yourself  - - - 0 -  Trouble concentrating - - - 0 -  Moving slowly or fidgety/restless - - - 0 -  Suicidal thoughts - - - 0 -  PHQ-9 Score - - - 0 -  Difficult doing work/chores - - - - -     Review of Systems  Constitutional: Positive for fatigue. Negative for activity change, appetite change, chills and fever.  HENT: Negative for facial swelling and hearing loss.   Eyes: Positive for pain, redness and itching. Negative for photophobia, discharge and visual disturbance.  Neurological: Positive for headaches. Negative for dizziness, tremors, seizures, syncope, weakness, light-headedness and numbness.  Psychiatric/Behavioral: Negative for dysphoric mood and sleep disturbance. The patient is not nervous/anxious.        Objective:   Physical Exam  Constitutional: She is oriented to person, place, and time. She appears well-developed and well-nourished. No distress.  HENT:  Head: Normocephalic and atraumatic.  Eyes: EOM and lids are  normal. Pupils are equal, round, and reactive to light. Lids are everted and swept, no foreign bodies found. Right eye exhibits no chemosis, no discharge and no exudate. No foreign body present in the right eye. Left eye exhibits no chemosis, no discharge and no exudate. No foreign body present in the left eye. Right conjunctiva is injected. Right conjunctiva has no hemorrhage. Left conjunctiva is not injected.  Fundoscopic exam:      The right eye shows no arteriolar narrowing, no AV nicking, no hemorrhage and no papilledema.       The left eye shows no arteriolar narrowing, no AV nicking, no hemorrhage and no papilledema.  Slit lamp  exam:      The right eye shows no corneal flare, no corneal ulcer, no foreign body and no fluorescein uptake.  Neck: Neck supple.  Cardiovascular: Normal rate.   Pulmonary/Chest: Effort normal. No respiratory distress.  Musculoskeletal: Normal range of motion.  Neurological: She is alert and oriented to person, place, and time. Coordination normal.  Skin: Skin is warm and dry. She is not diaphoretic.  Psychiatric: She has a normal mood and affect. Her behavior is normal.  Nursing note and vitals reviewed.  BP 102/60   Pulse 67   Temp 98.7 F (37.1 C) (Oral)   Resp 16   Ht 5' 5.75" (1.67 m)   Wt 196 lb (88.9 kg)   LMP 07/31/2016   SpO2 99%   BMI 31.88 kg/m      Assessment & Plan:   1. Conjunctival irritation - likely from dust at work.  2. Episodic tension-type headache, not intractable - pt declines treatment at this time - as seems very likely due to stress and lack of sleep with full-time job and starting school this wk w/ lots of time in front of the computer , has not tried otc nsaids, make sure to drink plenty of water, sleep    Meds ordered this encounter  Medications  . cholecalciferol (VITAMIN D) 1000 units tablet    Sig: Take 2,000 Units by mouth daily.  Marland Kitchen gentamicin (GARAMYCIN) 0.3 % ophthalmic solution    Sig: Place 1 drop into the right eye every 4 (four) hours.    Dispense:  5 mL    Refill:  0    I personally performed the services described in this documentation, which was scribed in my presence. The recorded information has been reviewed and considered, and addended by me as needed.   [name], M.D.  [facility] [address] [place], [place] [address] [phone] phone [phone] fax  08/11/16 9:38 AM

## 2016-08-21 ENCOUNTER — Ambulatory Visit (INDEPENDENT_AMBULATORY_CARE_PROVIDER_SITE_OTHER): Payer: BLUE CROSS/BLUE SHIELD | Admitting: Family Medicine

## 2016-08-21 VITALS — BP 100/60 | HR 80 | Temp 98.0°F | Resp 16 | Ht 65.0 in | Wt 195.6 lb

## 2016-08-21 DIAGNOSIS — E559 Vitamin D deficiency, unspecified: Secondary | ICD-10-CM

## 2016-08-21 DIAGNOSIS — Z23 Encounter for immunization: Secondary | ICD-10-CM

## 2016-08-21 DIAGNOSIS — M791 Myalgia: Secondary | ICD-10-CM | POA: Diagnosis not present

## 2016-08-21 DIAGNOSIS — N938 Other specified abnormal uterine and vaginal bleeding: Secondary | ICD-10-CM | POA: Diagnosis not present

## 2016-08-21 DIAGNOSIS — D509 Iron deficiency anemia, unspecified: Secondary | ICD-10-CM | POA: Diagnosis not present

## 2016-08-21 DIAGNOSIS — M609 Myositis, unspecified: Secondary | ICD-10-CM | POA: Diagnosis not present

## 2016-08-21 DIAGNOSIS — IMO0001 Reserved for inherently not codable concepts without codable children: Secondary | ICD-10-CM

## 2016-08-21 LAB — COMPREHENSIVE METABOLIC PANEL
ALT: 12 U/L (ref 6–29)
AST: 11 U/L (ref 10–30)
Albumin: 4.1 g/dL (ref 3.6–5.1)
Alkaline Phosphatase: 45 U/L (ref 33–115)
BUN: 12 mg/dL (ref 7–25)
CO2: 22 mmol/L (ref 20–31)
Calcium: 9.1 mg/dL (ref 8.6–10.2)
Chloride: 106 mmol/L (ref 98–110)
Creat: 0.75 mg/dL (ref 0.50–1.10)
Glucose, Bld: 84 mg/dL (ref 65–99)
Potassium: 4.3 mmol/L (ref 3.5–5.3)
Sodium: 140 mmol/L (ref 135–146)
Total Bilirubin: 0.3 mg/dL (ref 0.2–1.2)
Total Protein: 6.9 g/dL (ref 6.1–8.1)

## 2016-08-21 LAB — POCT URINALYSIS DIP (MANUAL ENTRY)
Bilirubin, UA: NEGATIVE
Glucose, UA: NEGATIVE
Ketones, POC UA: NEGATIVE
Nitrite, UA: NEGATIVE
Protein Ur, POC: NEGATIVE
Spec Grav, UA: 1.02
Urobilinogen, UA: 0.2
pH, UA: 5.5

## 2016-08-21 LAB — POCT CBC
Granulocyte percent: 65.7 %G (ref 37–80)
HCT, POC: 38.4 % (ref 37.7–47.9)
Hemoglobin: 13.1 g/dL (ref 12.2–16.2)
Lymph, poc: 2.2 (ref 0.6–3.4)
MCH, POC: 27.7 pg (ref 27–31.2)
MCHC: 34 g/dL (ref 31.8–35.4)
MCV: 81.5 fL (ref 80–97)
MID (cbc): 0.5 (ref 0–0.9)
MPV: 8.9 fL (ref 0–99.8)
POC Granulocyte: 5.3 (ref 2–6.9)
POC LYMPH PERCENT: 27.7 %L (ref 10–50)
POC MID %: 6.6 %M (ref 0–12)
Platelet Count, POC: 255 10*3/uL (ref 142–424)
RBC: 4.71 M/uL (ref 4.04–5.48)
RDW, POC: 13.2 %
WBC: 8 10*3/uL (ref 4.6–10.2)

## 2016-08-21 LAB — POCT WET + KOH PREP
Trich by wet prep: ABSENT
Yeast by KOH: ABSENT
Yeast by wet prep: ABSENT

## 2016-08-21 LAB — POC MICROSCOPIC URINALYSIS (UMFC): Mucus: ABSENT

## 2016-08-21 LAB — MAGNESIUM: Magnesium: 2 mg/dL (ref 1.5–2.5)

## 2016-08-21 LAB — CK: Total CK: 55 U/L (ref 7–177)

## 2016-08-21 LAB — THYROID PANEL WITH TSH
Free Thyroxine Index: 2.9 (ref 1.4–3.8)
T3 Uptake: 25 % (ref 22–35)
T4, Total: 11.5 ug/dL (ref 4.5–12.0)
TSH: 2.01 mIU/L

## 2016-08-21 LAB — POCT URINE PREGNANCY: Preg Test, Ur: NEGATIVE

## 2016-08-21 LAB — HCG, QUANTITATIVE, PREGNANCY: hCG, Beta Chain, Quant, S: <2 m[IU]/mL

## 2016-08-21 LAB — FERRITIN: Ferritin: 16 ng/mL (ref 10–154)

## 2016-08-21 NOTE — Progress Notes (Signed)
Subjective:  By signing my name below, I, Stann Oresung-Kai Tsai, attest that this documentation has been prepared under the direction and in the presence of Norberto SorensonEva Alizeh Madril, MD. Electronically Signed: Stann Oresung-Kai Tsai, Scribe. 08/21/2016 , 9:56 AM .  Patient was seen in Room 10 .   Patient ID: Deborah White, female    DOB: 01/06/1986, 30 y.o.   MRN: [id number] Chief Complaint  Patient presents with  . Vaginal Bleeding    x 1 week per patient it is not time for period   HPI Deborah White is a 30 y.o. female [facility] bleeding for a week. Patient was seen 4 months ago and started on OCP's. She's engaged to become married. At that point, she was complaining of weight gain, fatigue, hair loss and metrorrhagia; had an increase in hirsutism. 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 Her androgenic hormone and thyroid were normal. She was started on metformin, which she did well on and her periods began to normalize, so patient was continued on metformin 500mg  bid, and also started on sprintec.  Her periods regular until now.  Started menses on day 14 of pill pack, now day 21. Blood appears more brown than usu. No clots or heavy bleeding. Has been highly compliant with OCPs.   Past Medical History:  Diagnosis Date  . Anemia   . Thyroid disease    Prior to Admission medications   Medication Sig Start Date End Date Taking? Authorizing Provider  B Complex Vitamins (VITAMIN B COMPLEX PO) Take by mouth once.   Yes Historical Provider, MD  cholecalciferol (VITAMIN D) 1000 units tablet Take 2,000 Units by mouth daily.   Yes Historical Provider, MD  Ferrous Sulfate (IRON SUPPLEMENT PO) Take by mouth. Reported on 04/28/2016   Yes Historical Provider, MD  gentamicin (GARAMYCIN) 0.3 % ophthalmic solution Place 1 drop into the right eye every 4 (four) hours. 08/08/16  Yes Sherren MochaEva N Syerra Abdelrahman, MD  metFORMIN (GLUCOPHAGE) 500 MG tablet Take 1 tablet (500 mg total) by mouth 2 (two) times daily with a  meal. 04/28/16  Yes Sherren MochaEva N Jomes Giraldo, MD  norgestimate-ethinyl estradiol (ORTHO-CYCLEN,SPRINTEC,PREVIFEM) 0.25-35 MG-MCG tablet Take 1 tablet by mouth daily. 04/28/16  Yes Sherren MochaEva N Halvor Behrend, MD  ferrous fumarate (HEMOCYTE - 106 MG FE) 325 (106 FE) MG TABS tablet Take 1 tablet by mouth. Reported on 04/28/2016    Historical Provider, MD  MAGNESIUM PO Take by mouth.    Historical Provider, MD   No Known Allergies   Review of Systems  Constitutional: Negative for activity change, appetite change, chills, diaphoresis, fatigue, fever and unexpected weight change.  Gastrointestinal: Negative for abdominal pain, anal bleeding, blood in stool, constipation, diarrhea and rectal pain.  Genitourinary: Positive for menstrual problem and vaginal bleeding. Negative for decreased urine volume, difficulty urinating, dyspareunia, dysuria, frequency, genital sores, hematuria, pelvic pain, urgency, vaginal discharge and vaginal pain.  Musculoskeletal: Negative for gait problem.  Skin: Negative for rash.  Hematological: Negative for adenopathy.  Psychiatric/Behavioral: The patient is not nervous/anxious.        Objective:   Physical Exam  Constitutional: She is oriented to person, place, and time. She appears well-developed and well-nourished. No distress.  HENT:  Head: Normocephalic and atraumatic.  Eyes: EOM are normal. Pupils are equal, round, and reactive to light.  Neck: Neck supple.  Cardiovascular: Normal rate.   Pulmonary/Chest: Effort normal. No respiratory distress.  Musculoskeletal: Normal range of motion.  Neurological: She is alert and oriented to person, place, and  time.  Skin: Skin is warm and dry.  Psychiatric: She has a normal mood and affect. Her behavior is normal.  Nursing note and vitals reviewed.   BP 100/60 (BP Location: Right Arm, Patient Position: Sitting, Cuff Size: Large)   Pulse 80   Temp 98 F (36.7 C) (Oral)   Resp 16   Ht 5\' 5"  (1.651 m)   Wt 195 lb 9.6 oz (88.7 kg)   LMP  07/31/2016   SpO2 99%   BMI 32.55 kg/m     Assessment & Plan:   1. Need for prophylactic vaccination and inoculation against influenza   2. Dysfunctional uterine bleeding   3. Anemia, iron deficiency   4. Vitamin D deficiency   5. Myalgia and myositis    When pt started OCPs sev mos ago I had instructed her to start them on the first day of her cycle but failed to tell her that following the initial start, she should restart new pill pack every 28d. She has been starting a new pill pack on the first day of her menses for sev mos now so has artificially shortened her menstrual phase leading to the breakthrough bleeding.  As pt stays on OCPs she should be able to eventually do back-to-back pill packs as endometrium thins.  Orders Placed This Encounter  Procedures  . Flu Vaccine QUAD 36+ mos IM  . VITAMIN D 25 Hydroxy (Vit-D Deficiency, Fractures)  . Ferritin  . Thyroid Panel With TSH  . hCG, quantitative, pregnancy  . Comprehensive metabolic panel  . CK  . Magnesium  . Sedimentation Rate  . Care order/instruction:    AVS printed - let patient go!  Marland Kitchen POCT urinalysis dipstick  . POCT Microscopic Urinalysis ([facility])  . POCT urine pregnancy  . POCT CBC  . POCT Wet + KOH Prep    I personally performed the services described in this documentation, which was scribed in my presence. The recorded information has been reviewed and considered, and addended by me as needed.   [name], M.D.  [facility] [address] [place], [place] [address] [phone] phone [phone] fax  09/05/16 10:32 PM

## 2016-08-21 NOTE — Progress Notes (Signed)
u

## 2016-08-21 NOTE — Patient Instructions (Addendum)
IF you received an x-ray today, you will receive [facility] invoice from [facility]. Please contact [facility] at [phone] with questions or concerns regarding your invoice.   IF you received labwork today, you will receive an invoice from [facility]. Please contact [facility] at [phone] with questions or concerns regarding your invoice.   Our billing staff will not be able to assist you with questions regarding bills from these companies.  You will be contacted with the lab results as soon as they are available. The fastest way to get your results is to activate your My Chart account. Instructions are located on the last page of this paperwork. If you have not heard from us regarding the results in 2 weeks, please contact this office.   Oral Contraception Use Oral contraceptive pills (OCPs) are medicines taken to prevent pregnancy. OCPs work by preventing the ovaries from releasing eggs. The hormones in OCPs also cause the cervical mucus to thicken, preventing the sperm from entering the uterus. The hormones also cause the uterine lining to become thin, not allowing a fertilized egg to attach to the inside of the uterus. OCPs are highly effective when taken exactly as prescribed. However, OCPs do not prevent sexually transmitted diseases (STDs). Safe sex practices, such as using condoms along with an OCP, can help prevent STDs. Before taking OCPs, you may have a physical exam and Pap test. Your health care provider may also order blood tests if necessary. Your health care provider will make sure you are a good candidate for oral contraception. Discuss with your health care provider the possible side effects of the OCP you may be prescribed. When starting an OCP, it can take 2 to 3 months for the body to adjust to the changes in hormone levels in your body.  HOW TO TAKE ORAL CONTRACEPTIVE PILLS Your health care provider may advise you on how to start  taking the first cycle of OCPs. Otherwise, you can:   Start on day 1 of your menstrual period. You will not need any backup contraceptive protection with this start time.   Start on the first Sunday after your menstrual period or the day you get your prescription. In these cases, you will need to use backup contraceptive protection for the first week.   Start the pill at any time of your cycle. If you take the pill within 5 days of the start of your period, you are protected against pregnancy right away. In this case, you will not need a backup form of birth control. If you start at any other time of your menstrual cycle, you will need to use another form of birth control for 7 days. If your OCP is the type called a minipill, it will protect you from pregnancy after taking it for 2 days (48 hours). After you have started taking OCPs:   If you forget to take 1 pill, take it as soon as you remember. Take the next pill at the regular time.   If you miss 2 or more pills, call your health care provider because different pills have different instructions for missed doses. Use backup birth control until your next menstrual period starts.   If you use a 28-day pack that contains inactive pills and you miss 1 of the last 7 pills (pills with no hormones), it will not matter. Throw away the rest of the non-hormone pills and start a new pill pack.  No matter which day you start  the OCP, you will always start a new pack on that same day of the week. Have an extra pack of OCPs and a backup contraceptive method available in case you miss some pills or lose your OCP pack.  HOME CARE INSTRUCTIONS   Do not smoke.   Always use a condom to protect against STDs. OCPs do not protect against STDs.   Use a calendar to mark your menstrual period days.   Read the information and directions that came with your OCP. Talk to your health care provider if you have questions.  SEEK MEDICAL CARE IF:   You develop  nausea and vomiting.   You have abnormal vaginal discharge or bleeding.   You develop a rash.   You miss your menstrual period.   You are losing your hair.   You need treatment for mood swings or depression.   You get dizzy when taking the OCP.   You develop acne from taking the OCP.   You become pregnant.  SEEK IMMEDIATE MEDICAL CARE IF:   You develop chest pain.   You develop shortness of breath.   You have an uncontrolled or severe headache.   You develop numbness or slurred speech.   You develop visual problems.   You develop pain, redness, and swelling in the legs.    This information is not intended to replace advice given to you by your health care provider. Make sure you discuss any questions you have with your health care provider.   Document Released: 11/12/2011 Document Revised: 12/14/2014 Document Reviewed: 05/14/2013 Elsevier Interactive Patient Education 2016 [employer].  Oral Contraception Information Oral contraceptive pills (OCPs) are medicines taken to prevent pregnancy. OCPs work by preventing the ovaries from releasing eggs. The hormones in OCPs also cause the cervical mucus to thicken, preventing the sperm from entering the uterus. The hormones also cause the uterine lining to become thin, not allowing a fertilized egg to attach to the inside of the uterus. OCPs are highly effective when taken exactly as prescribed. However, OCPs do not prevent sexually transmitted diseases (STDs). Safe sex practices, such as using condoms along with the pill, can help prevent STDs.  Before taking the pill, you may have a physical exam and Pap test. Your health care provider may order blood tests. The health care provider will make sure you are a good candidate for oral contraception. Discuss with your health care provider the possible side effects of the OCP you may be prescribed. When starting an OCP, it can take 2 to 3 months for the body to adjust to  the changes in hormone levels in your body.  TYPES OF ORAL CONTRACEPTION  The combination pill--This pill contains estrogen and progestin (synthetic progesterone) hormones. The combination pill comes in 21-day, 28-day, or 91-day packs. Some types of combination pills are meant to be taken continuously (365-day pills). With 21-day packs, you do not take pills for 7 days after the last pill. With 28-day packs, the pill is taken every day. The last 7 pills are without hormones. Certain types of pills have more than 21 hormone-containing pills. With 91-day packs, the first 84 pills contain both hormones, and the last 7 pills contain no hormones or contain estrogen only.  The minipill--This pill contains the progesterone hormone only. The pill is taken every day continuously. It is very important to take the pill at the same time each day. The minipill comes in packs of 28 pills. All 28 pills contain the hormone.  ADVANTAGES OF ORAL CONTRACEPTIVE PILLS  Decreases premenstrual symptoms.   Treats menstrual period cramps.   Regulates the menstrual cycle.   Decreases a heavy menstrual flow.   May treatacne, depending on the type of pill.   Treats abnormal uterine bleeding.   Treats polycystic ovarian syndrome.   Treats endometriosis.   Can be used as emergency contraception.  THINGS THAT CAN MAKE ORAL CONTRACEPTIVE PILLS LESS EFFECTIVE OCPs can be less effective if:   You forget to take the pill at the same time every day.   You have a stomach or intestinal disease that lessens the absorption of the pill.   You take OCPs with other medicines that make OCPs less effective, such as antibiotics, certain HIV medicines, and some seizure medicines.   You take expired OCPs.   You forget to restart the pill on day 7, when using the packs of 21 pills.  RISKS ASSOCIATED WITH ORAL CONTRACEPTIVE PILLS  Oral contraceptive pills can sometimes cause side effects, such  as:  Headache.  Nausea.  Breast tenderness.  Irregular bleeding or spotting. Combination pills are also associated with a small increased risk of:  Blood clots.  Heart attack.  Stroke.   This information is not intended to replace advice given to you by your health care provider. Make sure you discuss any questions you have with your health care provider.   Document Released: 02/13/2003 Document Revised: 09/13/2013 Document Reviewed: 05/14/2013 Elsevier Interactive Patient Education [employer]2016 Elsevier Inc.

## 2016-08-22 LAB — SEDIMENTATION RATE: Sed Rate: 10 mm/hr (ref 0–20)

## 2016-08-22 LAB — VITAMIN D 25 HYDROXY (VIT D DEFICIENCY, FRACTURES): Vit D, 25-Hydroxy: 25 ng/mL — ABNORMAL LOW (ref 30–100)

## 2016-08-24 ENCOUNTER — Ambulatory Visit (INDEPENDENT_AMBULATORY_CARE_PROVIDER_SITE_OTHER): Payer: BLUE CROSS/BLUE SHIELD

## 2016-08-24 ENCOUNTER — Ambulatory Visit (INDEPENDENT_AMBULATORY_CARE_PROVIDER_SITE_OTHER): Payer: BLUE CROSS/BLUE SHIELD | Admitting: Family Medicine

## 2016-08-24 VITALS — BP 122/72 | HR 85 | Temp 98.8°F | Resp 17 | Ht 65.5 in | Wt 196.0 lb

## 2016-08-24 DIAGNOSIS — S97102A Crushing injury of unspecified left toe(s), initial encounter: Secondary | ICD-10-CM | POA: Diagnosis not present

## 2016-08-24 NOTE — Progress Notes (Signed)
Subjective:  By signing my name below, I, Stann Oresung-Kai Tsai, attest that this documentation has been prepared under the direction and in the presence of Norberto SorensonEva Valton Schwartz, MD. Electronically Signed: Stann Oresung-Kai Tsai, Scribe. 08/24/2016 , 11:33 AM .  Patient was seen in Room 12 .   Patient ID: Deborah White, female    DOB: 10/19/1986, 30 y.o.   MRN: [id number] Chief Complaint  Patient presents with  . Foot Pain    left side    HPI Deborah White is a 30 y.o. female who [facility] complaining of left foot pain 3 days ago. There was an injury involving a forklift crushing her great and 2nd left toes. She has a lot of swelling in the great toe but the 2nd toe has improved. She often walks fast in closed toe shoes for work.   Past Medical History:  Diagnosis Date  . Anemia   . Thyroid disease    Prior to Admission medications   Medication Sig Start Date End Date Taking? Authorizing Provider  B Complex Vitamins (VITAMIN B COMPLEX PO) Take by mouth once.   Yes Historical Provider, MD  cholecalciferol (VITAMIN D) 1000 units tablet Take 2,000 Units by mouth daily.   Yes Historical Provider, MD  ferrous fumarate (HEMOCYTE - 106 MG FE) 325 (106 FE) MG TABS tablet Take 1 tablet by mouth. Reported on 04/28/2016   Yes Historical Provider, MD  Ferrous Sulfate (IRON SUPPLEMENT PO) Take by mouth. Reported on 04/28/2016   Yes Historical Provider, MD  MAGNESIUM PO Take by mouth.   Yes Historical Provider, MD  metFORMIN (GLUCOPHAGE) 500 MG tablet Take 1 tablet (500 mg total) by mouth 2 (two) times daily with a meal. 04/28/16  Yes Sherren MochaEva N Kieffer Blatz, MD  norgestimate-ethinyl estradiol (ORTHO-CYCLEN,SPRINTEC,PREVIFEM) 0.25-35 MG-MCG tablet Take 1 tablet by mouth daily. 04/28/16  Yes Sherren MochaEva N Enrico Eaddy, MD  gentamicin (GARAMYCIN) 0.3 % ophthalmic solution Place 1 drop into the right eye every 4 (four) hours. Patient not taking: Reported on 08/24/2016 08/08/16   Sherren MochaEva N Marka Treloar, MD   No Known Allergies   Review of Systems    Constitutional: Negative for chills, fatigue and fever.  Musculoskeletal: Positive for arthralgias, gait problem, joint swelling and myalgias.  Skin: Negative for rash and wound.  Neurological: Negative for dizziness, weakness, numbness and headaches.       Objective:   Physical Exam  Constitutional: She is oriented to person, place, and time. She appears well-developed and well-nourished. No distress.  HENT:  Head: Normocephalic and atraumatic.  Eyes: EOM are normal. Pupils are equal, round, and reactive to light.  Neck: Neck supple.  Cardiovascular: Normal rate.   Pulmonary/Chest: Effort normal. No respiratory distress.  Musculoskeletal: Normal range of motion.  Neurological: She is alert and oriented to person, place, and time.  Skin: Skin is warm and dry.  Psychiatric: She has a normal mood and affect. Her behavior is normal.  Nursing note and vitals reviewed.   BP 122/72 (BP Location: Right Arm, Patient Position: Sitting, Cuff Size: Normal)   Pulse 85   Temp 98.8 F (37.1 C) (Oral)   Resp 17   Ht 5' 5.5" (1.664 m)   Wt 196 lb (88.9 kg)   LMP 07/31/2016   SpO2 100%   BMI 32.12 kg/m    Dg Toe Great Left  Result Date: 08/24/2016 CLINICAL DATA:  Injury to first toe of left foot. Initial encounter. EXAM: LEFT GREAT TOE COMPARISON:  None. FINDINGS: There is a nondisplaced transverse fracture  involving the tuft of the distal phalanx of the left first toe. No other injuries identified. Soft tissues are unremarkable. IMPRESSION: Nondisplaced fracture involving the tuft of the distal phalanx of the left first toe. Electronically Signed   By: Irish Lack M.D.   On: 08/24/2016 12:00      Assessment & Plan:   1. Crush injury of toe, left, initial encounter   Placed in post-op shoe but pt will not be able to wear at work since open toed - advised she get a pair of clogs to wear at work as wide toebox and inflexible platform base for work.  Wear x 4 wks, f/u if not improving in  sev wks and resolved with pain free in reg shoes in 6-8 wks.  Orders Placed This Encounter  Procedures  . DG Toe Great Left    Standing Status:   Future    Number of Occurrences:   1    Standing Expiration Date:   08/24/2017    Order Specific Question:   Reason for Exam (SYMPTOM  OR DIAGNOSIS REQUIRED)    Answer:   crush injury with fork lift 3d piror - Fri 9/15    Order Specific Question:   Is the patient pregnant?    Answer:   No    Order Specific Question:   Preferred imaging location?    Answer:   External     I personally performed the services described in this documentation, which was scribed in my presence. The recorded information has been reviewed and considered, and addended by me as needed.   [name], M.D.  [facility] [address] [place], [place] [address] [phone] phone [phone] fax  09/06/16 1:39 AM

## 2016-08-24 NOTE — Patient Instructions (Addendum)
IF you received an x-ray today, you will receive an invoice from [facility]. Please contact [facility] at [phone] with questions or concerns regarding your invoice.   IF you received labwork today, you will receive an invoice from [facility]. Please contact [facility] at [phone] with questions or concerns regarding your invoice.   Our billing staff will not be able to assist you with questions regarding bills from these companies.  You will be contacted with the lab results as soon as they are available. The fastest way to get your results is to activate your My Chart account. Instructions are located on the last page of this paperwork. If you have not heard from [place] regarding the results in 2 weeks, please contact this office.      Toe Fracture With Rehab A fracture is a break in the bone that can be either partial or complete. Fractures of the toe bones may or may not include the joints that separate the bones. SYMPTOMS   Severe pain over the fracture site at the time of injury that may persist for an extend period of time.  Pain, tenderness, inflammation, and/or bruising (contusion) over the fracture site.  Visible deformity, if the bone fragments are not properly aligned (displaced fracture).  Signs of vascular damage: numbness or coldness (uncommon). CAUSES  Toe fractures occur when a force is placed on the bone that is greater than it can withstand.  Direct hit (trauma) to the toe.  Indirect trauma to the toe, such as forcefully pivoting on a planted foot. RISK INCREASES WITH:  Performing activities barefoot (i.e. ballet, gymnastics).  Wearing shoes with little support or protection.  Sports with cleats (i.e. football, rugby, lacrosse, soccer).  Bone disease (i.e. osteoporosis, bone tumors). PREVENTION   Wear properly fitted and protective shoes.  Protect previously injured toes with tape or  padding. PROGNOSIS  If treated properly, toe fractures usually heal within 4 to 6 weeks. RELATED COMPLICATIONS   Failure of the fracture to heal (nonunion).  Healing of the fracture in a poor position (malunion).  Recurring symptoms.  Recurring symptoms that result in a chronic problem.  Excessive bleeding, causing pressure on nerves and blood vessels (rare).  Arthritis of the affected joints.  Stopping of bone growth in children.  Infection in fractures where the skin is broken over the fracture (open fracture).  Shortening of injured bones. TREATMENT  Treatment first involves the use of ice and medicine to reduce pain and inflammation. The toe should be restrained for a period of time to allow for healing, usually about 4 weeks. Your caregiver may advise wearing a hard-soled shoe to minimize stress on the healing bone. Surgery is uncommon for this injury, but may be necessary if the fracture is severely displaced or if the bone pushes through the skin. Surgery typically involves the use of screws, pins, and/or plates to hold the fracture in place. After surgery, restraint of the foot is necessary. MEDICATION   If pain medicine is necessary, nonsteroidal anti-inflammatory medications (aspirin and ibuprofen), or other minor pain relievers (acetaminophen), are often recommended.  Do not take pain medicine for 7 days before surgery.  Prescription pain relievers may be given if your caregiver thinks they are needed. Use only as directed and only as much as you need. COLD THERAPY  Cold treatment (icing) relieves pain and reduces inflammation. Cold treatment should be applied for 10 to 15 minutes every 2 to 3 hours, and immediately after activity  that aggravates your symptoms. Use ice packs or an ice massage. SEEK MEDICAL CARE IF:   Treatment does not seem to help, or the condition gets worse.  Any medicines produce negative side effects.  Any complications from surgery  occur:  Pain, numbness, or coldness in the affected foot.  Discoloration beneath the toenails (blue or gray) of the affected foot.  Signs of infection (fever, pain, inflammation, redness, or persistent bleeding). EXERCISES RANGE OF MOTION (ROM) AND STRETCHING EXERCISES - Toe Fracture (Phalangeal) These exercises may help you when beginning to rehabilitate your injury. Your symptoms may resolve with or without further involvement from your physician, physical therapist or athletic trainer. While completing these exercises, remember:   Restoring tissue flexibility helps normal motion to return to the joints. This allows healthier, less painful movement and activity.  An effective stretch should be held for at least 30 seconds.  A stretch should never be painful. You should only feel a gentle lengthening or release in the stretched tissue. RANGE OF MOTION - Dorsi/Plantar Flexion  While sitting with your right / left knee straight, draw the top of your foot upwards by flexing your ankle. Then reverse the motion, pointing your toes downward.  Hold each position for __________ seconds.  After completing your first set of exercises, repeat this exercise with your knee bent. Repeat __________ times. Complete this exercise __________ times per day.  RANGE OF MOTION - Ankle Alphabet Imagine your right / left big toe is a pen. Keeping your hip and knee still, write out the entire alphabet with your "pen." Make the letters as large as you can without increasing any discomfort. Repeat __________ times. Complete this exercise __________ times per day.  RANGE OF MOTION - Toe Extension, Flexion  Sit with your right / left leg crossed over your opposite knee.  Grasp your toes and gently pull them back toward the top of your foot. You should feel a stretch on the bottom of your toes and foot.  Hold this stretch for __________ seconds.  Now, gently pull your toes toward the bottom of your foot. You  should feel a stretch on the top of your toes and foot.  Hold this stretch for __________ seconds. Repeat __________ times. Complete this stretch__________ times per day.  STRENGTHENING EXERCISES - Toe Fracture (Phalangeal) These exercises may help you when beginning to rehabilitate your injury. They may resolve your symptoms with or without further involvement from your physician, physical therapist or athletic trainer. While completing these exercises, remember:   Muscles can gain both the endurance and the strength needed for everyday activities through controlled exercises.  Complete these exercises as instructed by your physician, physical therapist or athletic trainer. Increase the resistance and repetitions only as guided.  You may experience muscle soreness or fatigue, but the pain or discomfort you are trying to eliminate should never worsen during these exercises. If this pain does get worse, stop and make sure you are following the directions exactly. If the pain is still present after adjustments, discontinue the exercise until you can discuss the trouble with your clinician. STRENGTH - Towel Curls  Sit in a chair, on a non-carpeted surface.  Place your foot on a towel, keeping your heel on the floor.  Pull the towel toward your heel only by curling your toes. Keep your heel on the floor.  If instructed by your physician, physical therapist or athletic trainer, add ____________________ at the end of the towel. Repeat __________ times. Complete this exercise  __________ times per day.   This information is not intended to replace advice given to you by your health care provider. Make sure you discuss any questions you have with your health care provider.   Document Released: 11/23/2005 Document Revised: 04/09/2015 Document Reviewed: 03/07/2009 Elsevier Interactive Patient Education [employer] Elsevier Inc.

## 2016-08-31 ENCOUNTER — Encounter: Payer: Self-pay | Admitting: Family Medicine

## 2016-10-26 ENCOUNTER — Ambulatory Visit
Admission: RE | Admit: 2016-10-26 | Discharge: 2016-10-26 | Disposition: A | Discharge status: Home / Self Care | Payer: Worker's Compensation | Source: Ambulatory Visit | Attending: Family Medicine | Admitting: Family Medicine

## 2016-10-26 ENCOUNTER — Other Ambulatory Visit: Payer: Self-pay | Admitting: Family Medicine

## 2016-10-26 DIAGNOSIS — W19XXXD Unspecified fall, subsequent encounter: Secondary | ICD-10-CM

## 2016-11-10 ENCOUNTER — Ambulatory Visit (INDEPENDENT_AMBULATORY_CARE_PROVIDER_SITE_OTHER): Payer: Worker's Compensation | Admitting: Family Medicine

## 2016-11-10 ENCOUNTER — Ambulatory Visit (INDEPENDENT_AMBULATORY_CARE_PROVIDER_SITE_OTHER): Payer: Worker's Compensation

## 2016-11-10 VITALS — BP 106/68 | HR 74 | Temp 98.2°F | Resp 16 | Ht 65.0 in | Wt 192.0 lb

## 2016-11-10 DIAGNOSIS — M545 Low back pain: Secondary | ICD-10-CM

## 2016-11-10 DIAGNOSIS — M533 Sacrococcygeal disorders, not elsewhere classified: Secondary | ICD-10-CM | POA: Diagnosis not present

## 2016-11-10 MED ORDER — CYCLOBENZAPRINE HCL 10 MG PO TABS: 10.0000 mg | ORAL_TABLET | Freq: Every day | ORAL | 0 refills | Status: DC

## 2016-11-10 MED ORDER — PREDNISONE 10 MG PO TABS: ORAL_TABLET | ORAL | 0 refills | Status: DC

## 2016-11-10 NOTE — Progress Notes (Addendum)
By signing my name below, I, Mesha Guinyard, attest that this documentation has been prepared under the direction and in the presence of Norberto SorensonEva Shaw, MD.  Electronically Signed: Arvilla MarketMesha Guinyard, Medical Scribe. 11/10/16. 11:21 AM.  Subjective:    Patient ID: Deborah FranklinNibras White, female    DOB: 07/20/1986, 30 y.o.   MRN: [id number]  HPI Pain with sitting directly on her bu Chief Complaint  Patient presents with  . Back Pain    lower, X 2 WEEKS, Workers Comp     HPI Comments: Deborah Franklinibras White is a 30 y.o. female who presents to the [facility] complaining of back pain. Reports associated sxs of pain in her wrist, pain with lifting things, and sleep disturbance from pain. Pt slipped and fell directly on her buttock when her sxs occurred. When she went to get x-rays and no fractures were found. Pt reports she was not given further recommendations, no meds, PT, or home exercises. No work restrictions.   Pt works in [employer] and can feel pain after working for 8 hours and pain worsens when she's laying flat. She is relieved of back pain while sitting when she leans to the right. She has been taking ibuprofen 600n BID but she reports dizziness when she takes it. Denies weakness and numbness in her legs, and bowel incontinence.  Past medical hx, meds, allergies reviewed in detail.  Review of Systems  Constitutional: Positive for activity change. Negative for appetite change, chills, diaphoresis, fever and unexpected weight change.  Gastrointestinal: Negative for abdominal pain and vomiting.  Musculoskeletal: Positive for arthralgias, back pain and gait problem. Negative for joint swelling, neck pain and neck stiffness.  Allergic/Immunologic: Negative for immunocompromised state.  Neurological: Positive for dizziness (medication induced). Negative for weakness and numbness.  Hematological: Negative for adenopathy. Does not bruise/bleed easily.  Psychiatric/Behavioral: Positive for  sleep disturbance.    Objective:  Physical Exam  Constitutional: She appears well-developed and well-nourished. No distress.  HENT:  Head: Normocephalic and atraumatic.  Eyes: Conjunctivae are normal.  Neck: Neck supple.  Cardiovascular: Normal rate.   Pulmonary/Chest: Effort normal.  Musculoskeletal:  Tenderness over lumbar sacral junction Severe pain over the L>R SI joint Lumbar sacral paraspinal muscles L>R Mild tednerness over the left trocanteric bursa Nl ROM of right hip though extremes cause pain in the sacrum Left hip flexion limited to 75 degrees, unable to do straight leg raise Moderated ROM in the left hip  Neurological: She is alert.  Skin: Skin is warm and dry.  Psychiatric: She has a normal mood and affect. Her behavior is normal.  Nursing note and vitals reviewed.  BP 106/68 (BP Location: Right Arm, Patient Position: Sitting, Cuff Size: Large)   Pulse 74   Temp 98.2 F (36.8 C) (Oral)   Resp 16   Ht 5\' 5"  (1.651 m)   Wt 192 lb (87.1 kg)   LMP 10/20/2016 (Approximate)   SpO2 100%   BMI 31.95 kg/m    Dg Si Joints  Result Date: 11/10/2016 CLINICAL DATA:  Left SI joint dysfunction.  Prior fall. EXAM: BILATERAL SACROILIAC JOINTS - 3+ VIEW COMPARISON:  10/26/2016. FINDINGS: No acute or focal abnormality identified. SI joints are intact. If symptoms persist MRI can be obtained. IMPRESSION: No acute or focal abnormality. Electronically Signed   By: Maisie Fushomas  Register   On: 11/10/2016 12:12   Dg Hip Unilat W Or W/o Pelvis 2-3 Views Left  Result Date: 11/10/2016 CLINICAL DATA:  Fall.  Left SI joint dysfunction.  EXAM: DG HIP (WITH OR WITHOUT PELVIS) 2-3V LEFT COMPARISON:  10/26/2016 . FINDINGS: No acute soft tissue bony abnormality identified. No evidence of fracture or dislocation. SI joints are intact. If symptoms persist MRI can be obtained as needed . Tiny sclerotic focus noted over the right greater trochanter. This is most likely a bone island . IMPRESSION: No acute  abnormality identified . If symptoms persist MRI can be obtained . Electronically Signed   By: Maisie Fushomas  Register   On: 11/10/2016 12:01   Assessment & Plan:   1. Sacroiliac joint dysfunction of left side     Orders Placed This Encounter  Procedures  . DG Si Joints    Standing Status:   Future    Number of Occurrences:   1    Standing Expiration Date:   11/10/2017    Order Specific Question:   Reason for Exam (SYMPTOM  OR DIAGNOSIS REQUIRED)    Answer:   left SI joint dysfunction x 2 wks worsening after fall, limited left hip ROM    Order Specific Question:   Is the patient pregnant?    Answer:   No    Order Specific Question:   Preferred imaging location?    Answer:   External  . DG HIP UNILAT W OR W/O PELVIS 2-3 VIEWS LEFT    Standing Status:   Future    Number of Occurrences:   1    Standing Expiration Date:   11/10/2017    Order Specific Question:   Reason for Exam (SYMPTOM  OR DIAGNOSIS REQUIRED)    Answer:   left SI joint dysfunction x 2 wks worsening after fall, limited left hip ROM    Order Specific Question:   Is the patient pregnant?    Answer:   No    Order Specific Question:   Preferred imaging location?    Answer:   External  . Ambulatory referral to Physical Therapy    Referral Priority:   Routine    Referral Type:   Physical Medicine    Referral Reason:   Specialty Services Required    Requested Specialty:   Physical Therapy    Number of Visits Requested:   1    Meds ordered this encounter  Medications  . predniSONE (DELTASONE) 10 MG tablet    Sig: 6-6-5-5-4-4-3-3-2-2-1-1 tabs po qd    Dispense:  42 tablet    Refill:  0  . cyclobenzaprine (FLEXERIL) 10 MG tablet    Sig: Take 1 tablet (10 mg total) by mouth at bedtime.    Dispense:  30 tablet    Refill:  0    I personally performed the services described in this documentation, which was scribed in my presence. The recorded information has been reviewed and considered, and addended by me as needed.   Norberto SorensonEva  Shaw, M.D.  [facility]  [place] [address] [place], [place] [address] [phone] phone [phone] fax  12/11/16 2:52 AM

## 2016-11-10 NOTE — Patient Instructions (Addendum)
IF you received an x-ray today, you will receive an invoice from [facility]. Please contact [facility] at [phone] with questions or concerns regarding your invoice.   IF you received labwork today, you will receive an invoice from [facility]. Please contact [facility] at [phone] with questions or concerns regarding your invoice.   Our billing staff will not be able to assist you with questions regarding bills from these companies.  You will be contacted with the lab results as soon as they are available. The fastest way to get your results is to activate your My Chart account. Instructions are located on the last page of this paperwork. If you have not heard from us regarding the results in 2 weeks, please contact this office.     Sacroiliac Joint Dysfunction Introduction Sacroiliac joint dysfunction is a condition that causes inflammation on one or both sides of the sacroiliac (SI) joint. The SI joint connects the lower part of the spine (sacrum) with the two upper portions of the pelvis (ilium). This condition causes deep aching or burning pain in the low back. In some cases, the pain may also spread into one or both buttocks or hips or spread down the legs. What are the causes? This condition may be caused by:  Pregnancy. During pregnancy, extra stress is put on the SI joints because the pelvis widens.  Injury, such as:  Car accidents.  Sport-related injuries.  Work-related injuries.  Having one leg that is shorter than the other.  Conditions that affect the joints, such as:  Rheumatoid arthritis.  Gout.  Psoriatic arthritis.  Joint infection (septic arthritis). Sometimes, the cause of SI joint dysfunction is not known. What are the signs or symptoms? Symptoms of this condition include:  Aching or burning pain in the lower back. The pain may also spread to other areas, such as:  Buttocks.  Groin.  Thighs  and legs.  Muscle spasms in or around the painful areas.  Increased pain when standing, walking, running, stair climbing, bending, or lifting. How is this diagnosed? Your health care provider will do a physical exam and take your medical history. During the exam, the health care provider may move one or both of your legs to different positions to check for pain. Various tests may be done to help verify the diagnosis, including:  Imaging tests to look for other causes of pain. These may include:  MRI.  CT scan.  Bone scan.  Diagnostic injection. A numbing medicine is injected into the SI joint using a needle. If the pain is temporarily improved or stopped after the injection, this can indicate that SI joint dysfunction is the problem. How is this treated? Treatment may vary depending on the cause and severity of your condition. Treatment options may include:  Applying ice or heat to the lower back area. This can help to reduce pain and muscle spasms.  Medicines to relieve pain or inflammation or to relax the muscles.  Wearing a back brace (sacroiliac brace) to help support the joint while your back is healing.  Physical therapy to increase muscle strength around the joint and flexibility at the joint. This may also involve learning proper body positions and ways of moving to relieve stress on the joint.  Direct manipulation of the SI joint.  Injections of steroid medicine into the joint in order to reduce pain and swelling.  Radiofrequency ablation to burn away nerves that are carrying pain messages from the joint.  Use of a device that provides electrical stimulation in order to reduce pain at the joint.  Surgery to put in screws and plates that limit or prevent joint motion. This is rare. Follow these instructions at home:  Rest as needed. Limit your activities as directed by your health care provider.  Take medicines only as directed by your health care provider.  If  directed, apply ice to the affected area:  Put ice in a plastic bag.  Place a towel between your skin and the bag.  Leave the ice on for 20 minutes, 2-3 times per day.  Use a heating pad or a moist heat pack as directed by your health care provider.  Exercise as directed by your health care provider or physical therapist.  Keep all follow-up visits as directed by your health care provider. This is important. Contact a health care provider if:  Your pain is not controlled with medicine.  You have a fever.  You have increasingly severe pain. Get help right away if:  You have weakness, numbness, or tingling in your legs or feet.  You lose control of your bladder or bowel. This information is not intended to replace advice given to you by your health care provider. Make sure you discuss any questions you have with your health care provider. Document Released: 02/19/2009 Document Revised: 04/30/2016 Document Reviewed: 07/31/2014  2017 Elsevier

## 2016-11-18 ENCOUNTER — Telehealth: Payer: Self-pay | Admitting: Family Medicine

## 2016-11-18 NOTE — Telephone Encounter (Signed)
Someone from [employer] called and stated that pt wasn't supposed to come here for W/C so they wasn't going to pay for OV for 11/10/16 but since it stated that [employer] authorized the visit that they was going on to pay for visit but pt is to not return back here for a f/u for W/C if she do they want pay for visit that they will call pt and tell here where to go follow up at

## 2016-11-18 NOTE — Telephone Encounter (Signed)
Patient called in wanting us to send all her W/C visit to her personal insurance I told her that we could not do that since it was already filed as workers comp. She did not seem to happy that her W/C insurance is not going to pay for her visit.

## 2016-11-24 ENCOUNTER — Other Ambulatory Visit: Payer: Self-pay

## 2016-11-25 ENCOUNTER — Ambulatory Visit: Payer: Worker's Compensation | Admitting: Physical Therapy

## 2017-01-06 ENCOUNTER — Other Ambulatory Visit: Payer: Self-pay | Admitting: Family Medicine

## 2017-01-06 DIAGNOSIS — N63 Unspecified lump in unspecified breast: Secondary | ICD-10-CM

## 2017-01-28 ENCOUNTER — Other Ambulatory Visit: Payer: Self-pay

## 2017-02-01 ENCOUNTER — Ambulatory Visit
Admission: RE | Admit: 2017-02-01 | Discharge: 2017-02-01 | Disposition: A | Discharge status: Home / Self Care | Payer: BLUE CROSS/BLUE SHIELD | Source: Ambulatory Visit | Attending: Family Medicine | Admitting: Family Medicine

## 2017-02-01 ENCOUNTER — Other Ambulatory Visit: Payer: Self-pay | Admitting: Family Medicine

## 2017-02-01 ENCOUNTER — Ambulatory Visit: Payer: BLUE CROSS/BLUE SHIELD

## 2017-02-01 DIAGNOSIS — N63 Unspecified lump in unspecified breast: Secondary | ICD-10-CM

## 2017-02-01 DIAGNOSIS — N6320 Unspecified lump in the left breast, unspecified quadrant: Secondary | ICD-10-CM

## 2017-02-08 ENCOUNTER — Other Ambulatory Visit: Payer: Self-pay | Admitting: Family Medicine

## 2017-02-08 ENCOUNTER — Encounter: Payer: Self-pay | Admitting: Family Medicine

## 2017-02-08 ENCOUNTER — Ambulatory Visit (INDEPENDENT_AMBULATORY_CARE_PROVIDER_SITE_OTHER): Payer: BLUE CROSS/BLUE SHIELD | Admitting: Family Medicine

## 2017-02-08 VITALS — BP 115/70 | HR 93 | Temp 98.0°F | Resp 16 | Ht 65.0 in | Wt 195.0 lb

## 2017-02-08 DIAGNOSIS — E6609 Other obesity due to excess calories: Secondary | ICD-10-CM | POA: Diagnosis not present

## 2017-02-08 DIAGNOSIS — T733XXA Exhaustion due to excessive exertion, initial encounter: Secondary | ICD-10-CM

## 2017-02-08 DIAGNOSIS — N921 Excessive and frequent menstruation with irregular cycle: Secondary | ICD-10-CM | POA: Diagnosis not present

## 2017-02-08 DIAGNOSIS — R41840 Attention and concentration deficit: Secondary | ICD-10-CM | POA: Diagnosis not present

## 2017-02-08 DIAGNOSIS — N6320 Unspecified lump in the left breast, unspecified quadrant: Secondary | ICD-10-CM

## 2017-02-08 DIAGNOSIS — Z6832 Body mass index (BMI) 32.0-32.9, adult: Secondary | ICD-10-CM | POA: Diagnosis not present

## 2017-02-08 DIAGNOSIS — L2089 Other atopic dermatitis: Secondary | ICD-10-CM

## 2017-02-08 DIAGNOSIS — E559 Vitamin D deficiency, unspecified: Secondary | ICD-10-CM

## 2017-02-08 MED ORDER — LISDEXAMFETAMINE DIMESYLATE 20 MG PO CAPS: 20.0000 mg | ORAL_CAPSULE | Freq: Every day | ORAL | 0 refills | Status: DC

## 2017-02-08 MED ORDER — TRIAMCINOLONE ACETONIDE 0.1 % EX CREA: 1.0000 "application " | TOPICAL_CREAM | Freq: Three times a day (TID) | CUTANEOUS | 2 refills | Status: DC

## 2017-02-08 NOTE — Patient Instructions (Addendum)
IF you received an x-ray today, you will receive an invoice from [facility]. Please contact [facility] at [phone] with questions or concerns regarding your invoice.   IF you received labwork today, you will receive an invoice from [facility]. Please contact [facility] at [phone] with questions or concerns regarding your invoice.   Our billing staff will not be able to assist you with questions regarding bills from these companies.  You will be contacted with the lab results as soon as they are available. The fastest way to get your results is to activate your My Chart account. Instructions are located on the last page of this paperwork. If you have not heard from us regarding the results in 2 weeks, please contact this office.    People who benefit from stimulant therapy often complain of:  Problems with organization.  Difficulty staying focused.  Problems completing assignments at school.  Often making simple mistakes.  Problems sustaining mental effort.  Not listening to instructions.  Losing things often.  Forgetting things often.  Being easily distracted.  Fatigue Fatigue is feeling tired all of the time, a lack of energy, or a lack of motivation. Occasional or mild fatigue is often a normal response to activity or life in general. However, long-lasting (chronic) or extreme fatigue may indicate an underlying medical condition. Follow these instructions at home: Watch your fatigue for any changes. The following actions may help to lessen any discomfort you are feeling:  Talk to your health care provider about how much sleep you need each night. Try to get the required amount every night.  Take medicines only as directed by your health care provider.  Eat a healthy and nutritious diet. Ask your health care provider if you need help changing your diet.  Drink enough fluid to keep your urine clear or pale yellow.  Practice ways of relaxing,  such as yoga, meditation, massage therapy, or acupuncture.  Exercise regularly.  Change situations that cause you stress. Try to keep your work and personal routine reasonable.  Do not abuse illegal drugs.  Limit alcohol intake to no more than 1 drink per day for nonpregnant women and 2 drinks per day for men. One drink equals 12 ounces of beer, 5 ounces of wine, or 1 ounces of hard liquor.  Take a multivitamin, if directed by your health care provider. Contact a health care provider if:  Your fatigue does not get better.  You have a fever.  You have unintentional weight loss or gain.  You have headaches.  You have difficulty:  Falling asleep.  Sleeping throughout the night.  You feel angry, guilty, anxious, or sad.  You are unable to have a bowel movement (constipation).  You skin is dry.  Your legs or another part of your body is swollen. Get help right away if:  You feel confused.  Your vision is blurry.  You feel faint or pass out.  You have a severe headache.  You have severe abdominal, pelvic, or back pain.  You have chest pain, shortness of breath, or an irregular or fast heartbeat.  You are unable to urinate or you urinate less than normal.  You develop abnormal bleeding, such as bleeding from the rectum, vagina, nose, lungs, or nipples.  You vomit blood.  You have thoughts about harming yourself or committing suicide.  You are worried that you might harm someone else. This information is not intended to replace advice given to you by your health care  provider. Make sure you discuss any questions you have with your health care provider. Document Released: 09/20/2007 Document Revised: 04/30/2016 Document Reviewed: 03/27/2014 Elsevier Interactive Patient Education  2017 [employer].

## 2017-02-08 NOTE — Progress Notes (Signed)
Subjective:    Patient ID: Deborah White, female    DOB: 09/23/1986, 31 y.o.   MRN: [id number] Chief Complaint  Patient presents with  . Rash    arms and under eye/ x 2 wks/ itching  . other    pt feels like she is having concentration issues/ hard to focus in class and work    HPI  She is having more trouble focusing and concentrating. Less than normal.  She started a new job at [employer] lumbar is working better with her school - working full time.  She feels like she is so exhuasted she couldn't even finish her day.  The next morning, she had some vision changes - spots when she first woke up in the morning. Sleeping well. Passes out.  School is difficult - she hases prob understanding.  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  Grades in math aren't great. This is all new.  Gets about 7 hrs of sleep Appetite good. Mood iffy - more labile. Stopped metformin as she was loosing weight from stress and she needed the energy from food. Taking iron, b12 complex and OCPs. She was considering starting the energy drinks to help and wonders if that would be a good option for her.   Periods all right.  She is again having spotting.  Itchy rash on insides of elbows and then a little started on left upper and lower eyelid and now spreading to right upper eyelid. Hx of eczema - this feels similar. Has been using otc cortisone w/o improvement. Very itchy, worse at night. No new products or exposures.   Past Medical History:  Diagnosis Date  . Anemia   . Thyroid disease    No past surgical history on file. Current Outpatient Prescriptions on File Prior to Visit  Medication Sig Dispense Refill  . norgestimate-ethinyl estradiol (ORTHO-CYCLEN,SPRINTEC,PREVIFEM) 0.25-35 MG-MCG tablet Take 1 tablet by mouth daily. 3 Package 4   No current facility-administered medications on file prior to visit.    No Known Allergies Family History  Problem Relation Age of Onset  . Breast cancer  Mother   . Hypertension Mother   . Cancer Maternal Uncle     brain cancer   . Heart disease Maternal Grandmother    Social History   Social History  . Marital status: Single    Spouse name: N/A  . Number of children: N/A  . Years of education: N/A   Social History Main Topics  . Smoking status: Former Smoker    Quit date: 06/23/2016  . Smokeless tobacco: Never Used  . Alcohol use No  . Drug use: No  . Sexual activity: Not Currently   Other Topics Concern  . None   Social History Narrative  . None   [facility] 02/08/2017 11/10/2016 08/24/2016 08/21/2016 04/28/2016  Decreased Interest 0 0 0 0 0  Down, Depressed, Hopeless 0 0 0 0 0  PHQ - 2 Score 0 0 0 0 0  Altered sleeping - - - - -  Tired, decreased energy - - - - -  Change in appetite - - - - -  Feeling bad or failure about yourself  - - - - -  Trouble concentrating - - - - -  Moving slowly or fidgety/restless - - - - -  Suicidal thoughts - - - - -  PHQ-9 Score - - - - -  Difficult doing work/chores - - - - -  Review of Systems  Constitutional: Positive for fatigue. Negative for activity change, appetite change, chills, diaphoresis, fever and unexpected weight change.  Respiratory: Negative for cough, chest tightness and shortness of breath.   Cardiovascular: Negative for chest pain, palpitations and leg swelling.  Gastrointestinal: Negative for abdominal pain.  Endocrine: Negative for polydipsia, polyphagia and polyuria.  Genitourinary: Negative for difficulty urinating, dysuria, frequency, menstrual problem, pelvic pain, vaginal bleeding and vaginal discharge.  Skin: Positive for rash. Negative for wound.  Allergic/Immunologic: Negative for immunocompromised state.  Neurological: Positive for weakness, light-headedness and headaches. Negative for dizziness.  Hematological: Negative for adenopathy.  Psychiatric/Behavioral: Positive for behavioral problems, confusion and decreased concentration.  Negative for agitation, dysphoric mood, hallucinations, self-injury, sleep disturbance and suicidal ideas. The patient is not nervous/anxious and is not hyperactive.        Objective:   Physical Exam  Constitutional: She is oriented to person, place, and time. She appears well-developed and well-nourished. No distress.  tired  HENT:  Head: Normocephalic and atraumatic.  Right Ear: External ear normal.  Left Ear: External ear normal.  Eyes: Conjunctivae are normal. No scleral icterus.  Neck: Normal range of motion. Neck supple. No thyromegaly present.  Cardiovascular: Normal rate, regular rhythm, normal heart sounds and intact distal pulses.   Pulmonary/Chest: Effort normal and breath sounds normal. No respiratory distress.  Musculoskeletal: She exhibits no edema.  Lymphadenopathy:    She has no cervical adenopathy.  Neurological: She is alert and oriented to person, place, and time.  Skin: Skin is warm and dry. Rash noted. She is not diaphoretic. No erythema.  Well-defined erythematous oval plaques clustered at bilateral antecubital fossa ranging from 1x2 cm to 2x4 cm in size, no scale, no central clearing, no drainage, + excoriations  Psychiatric: She has a normal mood and affect. Her behavior is normal.      BP 115/70   Pulse 93   Temp 98 F (36.7 C) (Oral)   Resp 16   Ht 5\' 5"  (1.651 m)   Wt 195 lb (88.5 kg)   LMP 01/13/2017   SpO2 99%   BMI 32.45 kg/m      Assessment & Plan:   1. Fatigue due to excessive exertion, initial encounter - Deborah White is a delightful young woman who I have had the honor to get to know well over the past several years as well as many of her family members. 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 She is newly engaged  within the past yr so is adjusted to living with a sig other. She also is still struggling with the more complex English in the adv college courses, and translates for many of her family members at their appts, visits, errands.  There are simply not enough hours in her day to allow her brain to adequately rest to perform at the level at which she is accustomed.  Strongly encouraged trying to add in a.m. exercise to help w/ energy/focused. Discouraged energy drinks. Reviewed options of wellbutrin, effexor, or trial of low-dose prn stimulant like Vyvanse. Pt open to all but opts for latter since can be used on a prn basis and more immed onset. Deborah White has worked very hard to support herself and her family while pursuing her degree  for a long-term career so I do think this is going to be a limited problem that will likely resolve after she graduates 05/19. Reviewed potential for abuse/addictive nature in detail. Pt without any red flags in self or family. Recheck in 1 mo.  2. Flexural atopic dermatitis - suspect eczema vs contact derm, try TAC, use VERY sparingly qd on eyes or change to otc cortisone on eyes. Warned of overuse side effects.  3. Difficulty concentrating   4. Breakthrough bleeding on birth control pills - suspect due to stress, cont to watch, can change brand if continues or worsens.  5. Vitamin D deficiency - dose lower, restart high dose replacement x 6 mos, then transition to otc 2000-5000u/d  6. Class 1 obesity due to excess calories without serious comorbidity with body mass index (BMI) of 32.0 to 32.9 in adult - weight stable over the past 6 mos, would like to loose weight and had lost 20 lbs the yr prior, try to increase exercise, reviewed healthy high protein diet choices w/ freq snacking    Orders Placed This Encounter  Procedures  . Comprehensive metabolic panel  . CBC  . Vitamin B12  . Ferritin  . TSH  . VITAMIN D 25 Hydroxy (Vit-D Deficiency, Fractures)  . Hemoglobin A1c  .  Hemoglobin A1c  . Care order/instruction:    AVS printed - let patient go!    Meds ordered this encounter  Medications  . ferrous sulfate 325 (65 FE) MG tablet    Sig: Take 325 mg by mouth daily with breakfast.  . Cyanocobalamin (VITAMIN B 12 PO)    Sig: Take by mouth.  . lisdexamfetamine (VYVANSE) 20 MG capsule    Sig: Take 1 capsule (20 mg total) by mouth daily.    Dispense:  30 capsule    Refill:  0  . triamcinolone cream (KENALOG) 0.1 %    Sig: Apply 1 application topically 3 (three) times daily.    Dispense:  80 g    Refill:  2  . Vitamin D, Ergocalciferol, (DRISDOL) 50000 units CAPS capsule    Sig: Take 1 capsule (50,000 Units total) by mouth every 7 (seven) days.    Dispense:  24 capsule    Refill:  0    [name], M.D.  Primary Care at [facility] [address] [place], [place] [address] [phone] phone [phone] fax  02/09/17 11:27 AM

## 2017-02-09 ENCOUNTER — Encounter: Payer: Self-pay | Admitting: Family Medicine

## 2017-02-09 ENCOUNTER — Telehealth: Payer: Self-pay

## 2017-02-09 LAB — CBC
Hematocrit: 40.2 % (ref 34.0–46.6)
Hemoglobin: 12.8 g/dL (ref 11.1–15.9)
MCH: 26.4 pg — ABNORMAL LOW (ref 26.6–33.0)
MCHC: 31.8 g/dL (ref 31.5–35.7)
MCV: 83 fL (ref 79–97)
Platelets: 283 10*3/uL (ref 150–379)
RBC: 4.84 x10E6/uL (ref 3.77–5.28)
RDW: 13.7 % (ref 12.3–15.4)
WBC: 5.4 10*3/uL (ref 3.4–10.8)

## 2017-02-09 LAB — HEMOGLOBIN A1C
Est. average glucose Bld gHb Est-mCnc: 97 mg/dL
Hgb A1c MFr Bld: 5 % (ref 4.8–5.6)

## 2017-02-09 LAB — COMPREHENSIVE METABOLIC PANEL
ALT: 12 IU/L (ref 0–32)
AST: 9 IU/L (ref 0–40)
Albumin/Globulin Ratio: 1.7 (ref 1.2–2.2)
Albumin: 4.2 g/dL (ref 3.5–5.5)
Alkaline Phosphatase: 53 IU/L (ref 39–117)
BUN/Creatinine Ratio: 16 (ref 9–23)
BUN: 9 mg/dL (ref 6–20)
Bilirubin Total: 0.3 mg/dL (ref 0.0–1.2)
CO2: 25 mmol/L (ref 18–29)
Calcium: 9.2 mg/dL (ref 8.7–10.2)
Chloride: 106 mmol/L (ref 96–106)
Creatinine, Ser: 0.55 mg/dL — ABNORMAL LOW (ref 0.57–1.00)
GFR calc Af Amer: 146 mL/min/{1.73_m2} (ref >59)
GFR calc non Af Amer: 126 mL/min/{1.73_m2} (ref >59)
Globulin, Total: 2.5 g/dL (ref 1.5–4.5)
Glucose: 100 mg/dL — ABNORMAL HIGH (ref 65–99)
Potassium: 4.5 mmol/L (ref 3.5–5.2)
Sodium: 145 mmol/L — ABNORMAL HIGH (ref 134–144)
Total Protein: 6.7 g/dL (ref 6.0–8.5)

## 2017-02-09 LAB — TSH: TSH: 1.53 u[IU]/mL (ref 0.450–4.500)

## 2017-02-09 LAB — VITAMIN B12: Vitamin B-12: 364 pg/mL (ref 232–1245)

## 2017-02-09 LAB — FERRITIN: Ferritin: 21 ng/mL (ref 15–150)

## 2017-02-09 LAB — VITAMIN D 25 HYDROXY (VIT D DEFICIENCY, FRACTURES): Vit D, 25-Hydroxy: 19 ng/mL — ABNORMAL LOW (ref 30.0–100.0)

## 2017-02-09 MED ORDER — VITAMIN D (ERGOCALCIFEROL) 1.25 MG (50000 UNIT) PO CAPS: 50000.0000 [IU] | ORAL_CAPSULE | ORAL | 0 refills | Status: DC

## 2017-02-09 NOTE — Telephone Encounter (Signed)
Did she give the pharmacist the coupon? - she should get a 30d free trial with the coupon even if her insurance doesn't approve and then she would have a comparison for one of these other meds. If it doesn't work even with the coupon, can try any of those - will try concerta since it also lasts for 12 hrs. Can pick up new rx from [facility] on Thurs 3/8 from 8-6 if needed. Let me know

## 2017-02-09 NOTE — Telephone Encounter (Signed)
Key code [id number] covermymeds

## 2017-02-09 NOTE — Telephone Encounter (Signed)
vyvanse denied  Needs to try and fail adderall xr, dexmethylphenidate er, concerta

## 2017-02-10 ENCOUNTER — Ambulatory Visit
Admission: RE | Admit: 2017-02-10 | Discharge: 2017-02-10 | Disposition: A | Discharge status: Home / Self Care | Payer: BLUE CROSS/BLUE SHIELD | Source: Ambulatory Visit | Attending: Family Medicine | Admitting: Family Medicine

## 2017-02-10 ENCOUNTER — Telehealth: Payer: Self-pay | Admitting: Emergency Medicine

## 2017-02-10 DIAGNOSIS — N6320 Unspecified lump in the left breast, unspecified quadrant: Secondary | ICD-10-CM

## 2017-02-10 NOTE — Telephone Encounter (Signed)
-----   Message from Sherren MochaEva N Shaw, MD sent at 02/05/2017  1:32 PM EST ----- Please check to see if pt wants to proceed with the bilateral screening breast MRI that the radiologist recommended she have annually.

## 2017-02-11 ENCOUNTER — Other Ambulatory Visit: Payer: Self-pay | Admitting: Family Medicine

## 2017-02-11 DIAGNOSIS — Z1239 Encounter for other screening for malignant neoplasm of breast: Secondary | ICD-10-CM

## 2017-02-11 MED ORDER — DEXMETHYLPHENIDATE HCL ER 10 MG PO CP24: 10.0000 mg | ORAL_CAPSULE | Freq: Every day | ORAL | 0 refills | Status: DC

## 2017-02-11 NOTE — Telephone Encounter (Signed)
Pt advised.

## 2017-02-11 NOTE — Telephone Encounter (Signed)
Printed off new rx for pt to pick up - I think her ins will cover this one.

## 2017-02-11 NOTE — Telephone Encounter (Signed)
THE PATIENT SAID SHE GAVE THE PHARMACIST THE COUPON AND THEY SAID THAT WITH THE COUPON IT WOULD STILL BE ABOUT $200.00. SHE SAID THEY TOLD HER DR. SHAW HAS TO CALL HER INSURANCE COMPANY BEFORE THEY WILL PAY ANYTHING. BEST PHONE [phone] (CELL) PHARMACY CHOICE IS [facility] [address] ([address]) [place]

## 2017-02-19 NOTE — Telephone Encounter (Signed)
[facility] states that the order needs to be changed to w/wo please. Thanks!

## 2017-02-19 NOTE — Addendum Note (Signed)
Addended by: Fernande BrasJEFFERY, Tyrhonda Georgiades S on: 02/19/2017 09:07 AM   Modules accepted: Orders

## 2017-02-19 NOTE — Telephone Encounter (Signed)
Changed order on Dr. Shaw's behalf as recommended by imaging facility. 

## 2017-02-19 NOTE — Progress Notes (Signed)
Changed order on Dr. Alver FisherShaw's behalf as recommended by imaging facility.

## 2017-03-08 ENCOUNTER — Encounter: Payer: Self-pay | Admitting: Family Medicine

## 2017-03-08 ENCOUNTER — Ambulatory Visit (INDEPENDENT_AMBULATORY_CARE_PROVIDER_SITE_OTHER): Payer: BLUE CROSS/BLUE SHIELD | Admitting: Family Medicine

## 2017-03-08 VITALS — BP 117/77 | HR 88 | Temp 98.6°F | Resp 18 | Ht 65.0 in | Wt 195.0 lb

## 2017-03-08 DIAGNOSIS — R41840 Attention and concentration deficit: Secondary | ICD-10-CM

## 2017-03-08 DIAGNOSIS — T733XXD Exhaustion due to excessive exertion, subsequent encounter: Secondary | ICD-10-CM

## 2017-03-08 MED ORDER — DEXMETHYLPHENIDATE HCL ER 20 MG PO CP24: 20.0000 mg | ORAL_CAPSULE | Freq: Every day | ORAL | 0 refills | Status: DC

## 2017-03-08 NOTE — Progress Notes (Signed)
Subjective:    Patient ID: Deborah White, female    DOB: 02-02-1986, 31 y.o.   MRN: [id number] Chief Complaint  Patient presents with  . Follow-up    fatigue d/t excessive exertion.    HPI  I saw Deborah White 1 mo prior when she was feeilng overwhelmed due to the multiple roles in her life that were continuing to expand and becomes more difficult so we tried her on prn adderall. Her weight is stable BP and pulse nml.  She is not taking daily - only if she is using work or school and still has about 18 pills left. She is noticing that she han increased appetite. She will not take it on days she knows she wants to catch up with napping. She is sleeping very well at night. She doesn't notice any side effects - feels they are mild.  Keep the feeling of exhaustian at bay.  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  Lasts about 8-10 hours - helps her be more positive, calm, and focused.   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 Her vitamin B12 and iron were also on the low side of normal.  Past Medical History:  Diagnosis Date  . Anemia   . Thyroid disease    History reviewed. No pertinent surgical history. Current Outpatient Prescriptions on File Prior to Visit  Medication Sig Dispense Refill  . Cyanocobalamin (VITAMIN B 12 PO) Take by mouth.    . ferrous sulfate 325 (65 FE) MG tablet Take 325 mg by mouth daily with breakfast.    . norgestimate-ethinyl estradiol (ORTHO-CYCLEN,SPRINTEC,PREVIFEM) 0.25-35 MG-MCG tablet Take 1 tablet by mouth daily. 3 Package 4  . triamcinolone cream (KENALOG) 0.1 % Apply 1 application topically 3 (three) times daily. 80 g 2  . Vitamin D, Ergocalciferol, (DRISDOL) 50000 units CAPS capsule Take 1 capsule (50,000 Units total) by mouth every 7 (seven) days. 24 capsule 0   No current  facility-administered medications on file prior to visit.    No Known Allergies Family History  Problem Relation Age of Onset  . Breast cancer Mother   . Hypertension Mother   . Cancer Maternal Uncle     brain cancer   . Heart disease Maternal Grandmother    Social History   Social History  . Marital status: Single    Spouse name: N/A  . Number of children: N/A  . Years of education: N/A   Social History Main Topics  . Smoking status: Former Smoker    Quit date: 06/23/2016  . Smokeless tobacco: Never Used  . Alcohol use No  . Drug use: No  . Sexual activity: Not Currently   Other Topics Concern  . None   Social History Narrative  . None   Depression screen [facility] 2/9 03/08/2017 02/08/2017 11/10/2016 08/24/2016 08/21/2016  Decreased Interest 0 0 0 0 0  Down, Depressed, Hopeless 0 0 0 0 0  PHQ - 2 Score 0 0 0 0 0  Altered sleeping - - - - -  Tired, decreased energy - - - - -  Change in appetite - - - - -  Feeling bad or failure about yourself  - - - - -  Trouble concentrating - - - - -  Moving slowly or fidgety/restless - - - - -  Suicidal thoughts - - - - -  PHQ-9 Score - - - - -  Difficult doing work/chores - - - - -     Review of Systems  Constitutional: Positive for fatigue. Negative for activity change, appetite change, chills, diaphoresis, fever and unexpected weight change.  Cardiovascular: Negative for chest pain.  Gastrointestinal: Negative for nausea and vomiting.  Neurological: Negative for dizziness, light-headedness and headaches.  Psychiatric/Behavioral: Positive for decreased concentration. Negative for agitation, behavioral problems, confusion, dysphoric mood and sleep disturbance. The patient is not nervous/anxious and is not hyperactive.        Objective:   Physical Exam  Constitutional: She is oriented to person, place, and time. She appears well-developed and well-nourished. No distress.  HENT:  Head: Normocephalic and atraumatic.  Right Ear:  External ear normal.  Left Ear: External ear normal.  Eyes: Conjunctivae are normal. No scleral icterus.  Neck: Normal range of motion. Neck supple. No thyromegaly present.  Cardiovascular: Normal rate, regular rhythm, normal heart sounds and intact distal pulses.   Pulmonary/Chest: Effort normal and breath sounds normal. No respiratory distress.  Musculoskeletal: She exhibits no edema.  Lymphadenopathy:    She has no cervical adenopathy.  Neurological: She is alert and oriented to person, place, and time.  Skin: Skin is warm and dry. She is not diaphoretic. No erythema.  Psychiatric: She has a normal mood and affect. Her behavior is normal.      BP 117/77   Pulse 88   Temp 98.6 F (37 C) (Oral)   Resp 18   Ht  (1.651 m)   Wt 195 lb (88.5 kg)   LMP 02/09/2017   SpO2 99%   BMI 32.45 kg/m      Assessment & Plan:   1. Difficulty concentrating   2. Fatigue due to excessive exertion, subsequent encounter   She is doing very well but does feel that she would benefit from a stronger dose - describes the effect as mild - so try increasing Focalin from 10 -> 20. She will call in sev wks to let me know how she is doing. If tolerating well, will print off 2 more rxs so she can f/u in 3 mos for recheck.  Reviewed family planning today and preconception recs as well - pt still on ocps with no immediately plans to d/c at this point.  Meds ordered this encounter  Medications  . dexmethylphenidate (FOCALIN XR) 20 MG 24 hr capsule    Sig: Take 1 capsule (20 mg total) by mouth daily.    Dispense:  30 capsule    Refill:  0    I personally performed the services described in this documentation, which was scribed in my presence. The recorded information has been reviewed and considered, and addended by me as needed.   [name], M.D.  Primary Care at [facility] [address] [place], [place] [address] [phone] phone [phone] fax  03/09/17 11:32 AM

## 2017-03-08 NOTE — Patient Instructions (Signed)
     IF you received an x-ray today, you will receive an invoice from [facility]. Please contact [facility] at [phone] with questions or concerns regarding your invoice.   IF you received labwork today, you will receive an invoice from [facility]. Please contact [facility] at [phone] with questions or concerns regarding your invoice.   Our billing staff will not be able to assist you with questions regarding bills from these companies.  You will be contacted with the lab results as soon as they are available. The fastest way to get your results is to activate your My Chart account. Instructions are located on the last page of this paperwork. If you have not heard from us regarding the results in 2 weeks, please contact this office.     

## 2017-04-05 ENCOUNTER — Ambulatory Visit (INDEPENDENT_AMBULATORY_CARE_PROVIDER_SITE_OTHER): Payer: BLUE CROSS/BLUE SHIELD | Admitting: Family Medicine

## 2017-04-05 VITALS — BP 125/77 | HR 93 | Temp 98.0°F | Resp 16 | Ht 65.0 in | Wt 192.0 lb

## 2017-04-05 DIAGNOSIS — N920 Excessive and frequent menstruation with regular cycle: Secondary | ICD-10-CM | POA: Diagnosis not present

## 2017-04-05 DIAGNOSIS — R41840 Attention and concentration deficit: Secondary | ICD-10-CM

## 2017-04-05 DIAGNOSIS — N946 Dysmenorrhea, unspecified: Secondary | ICD-10-CM

## 2017-04-05 DIAGNOSIS — R238 Other skin changes: Secondary | ICD-10-CM

## 2017-04-05 DIAGNOSIS — R233 Spontaneous ecchymoses: Secondary | ICD-10-CM

## 2017-04-05 LAB — POCT CBC
Granulocyte percent: 67.9 %G (ref 37–80)
HCT, POC: 36.9 % — AB (ref 37.7–47.9)
Hemoglobin: 12.9 g/dL (ref 12.2–16.2)
Lymph, poc: 2.2 (ref 0.6–3.4)
MCH, POC: 28 pg (ref 27–31.2)
MCHC: 35 g/dL (ref 31.8–35.4)
MCV: 80.1 fL (ref 80–97)
MID (cbc): 0.1 (ref 0–0.9)
MPV: 9.6 fL (ref 0–99.8)
POC Granulocyte: 4.9 (ref 2–6.9)
POC LYMPH PERCENT: 31.1 %L (ref 10–50)
POC MID %: 1 %M (ref 0–12)
Platelet Count, POC: 242 10*3/uL (ref 142–424)
RBC: 4.61 M/uL (ref 4.04–5.48)
RDW, POC: 12.4 %
WBC: 7.2 10*3/uL (ref 4.6–10.2)

## 2017-04-05 LAB — POCT URINE PREGNANCY: Preg Test, Ur: NEGATIVE

## 2017-04-05 MED ORDER — NORGESTIMATE-ETH ESTRADIOL 0.25-35 MG-MCG PO TABS: 1.0000 | ORAL_TABLET | Freq: Every day | ORAL | 4 refills | Status: DC

## 2017-04-05 MED ORDER — AMPHETAMINE-DEXTROAMPHETAMINE 10 MG PO TABS: 10.0000 mg | ORAL_TABLET | Freq: Two times a day (BID) | ORAL | 0 refills | Status: DC

## 2017-04-05 NOTE — Progress Notes (Signed)
Subjective:    Patient ID: Deborah White, female    DOB: 12-15-1985, 31 y.o.   MRN: [id number] Chief Complaint  Patient presents with  . Menstrual Problem    pt did not take a  BC pills for 2 wks and her period has been painful and heavy  . Insect Bite    left leg/ itching x 2 days    HPI   She had her menese 2 weeks early with severe dysmenorrhea - she actually couldn't cnitnue at work.  She had 2 weeks of heavy bleeding and clotting after she  No vaginal sxs.   Focalin XR  headache - the length is ok. Wears off by bed times  Past Medical History:  Diagnosis Date  . Anemia   . Thyroid disease    No past surgical history on file. Current Outpatient Prescriptions on File Prior to Visit  Medication Sig Dispense Refill  . Cyanocobalamin (VITAMIN B 12 PO) Take by mouth.    . ferrous sulfate 325 (65 FE) MG tablet Take 325 mg by mouth daily with breakfast.    . triamcinolone cream (KENALOG) 0.1 % Apply 1 application topically 3 (three) times daily. 80 g 2  . Vitamin D, Ergocalciferol, (DRISDOL) 50000 units CAPS capsule Take 1 capsule (50,000 Units total) by mouth every 7 (seven) days. 24 capsule 0   No current facility-administered medications on file prior to visit.    No Known Allergies Family History  Problem Relation Age of Onset  . Breast cancer Mother   . Hypertension Mother   . Cancer Maternal Uncle        brain cancer   . Heart disease Maternal Grandmother    Social History   Social History  . Marital status: Single    Spouse name: N/A  . Number of children: N/A  . Years of education: N/A   Social History Main Topics  . Smoking status: Former Smoker    Quit date: 06/23/2016  . Smokeless tobacco: Never Used  . Alcohol use No  . Drug use: No  . Sexual activity: Not Currently   Other Topics Concern  . Not on file   Social History Narrative  . No narrative on file   Depression screen [facility] 2/9 04/05/2017 03/08/2017 02/08/2017 11/10/2016 08/24/2016    Decreased Interest 0 0 0 0 0  Down, Depressed, Hopeless 0 0 0 0 0  PHQ - 2 Score 0 0 0 0 0  Altered sleeping - - - - -  Tired, decreased energy - - - - -  Change in appetite - - - - -  Feeling bad or failure about yourself  - - - - -  Trouble concentrating - - - - -  Moving slowly or fidgety/restless - - - - -  Suicidal thoughts - - - - -  PHQ-9 Score - - - - -  Difficult doing work/chores - - - - -    Review of Systems See hpi    Objective:   Physical Exam  Constitutional: She is oriented to person, place, and time. She appears well-developed and well-nourished. No distress.  HENT:  Head: Normocephalic and atraumatic.  Right Ear: External ear normal.  Left Ear: External ear normal.  Eyes: Conjunctivae are normal. No scleral icterus.  Neck: Normal range of motion. Neck supple. No thyromegaly present.  Cardiovascular: Normal rate, regular rhythm, normal heart sounds and intact distal pulses.   Pulmonary/Chest: Effort normal and breath sounds normal. No respiratory distress.  Musculoskeletal: She exhibits no edema.  Lymphadenopathy:    She has no cervical adenopathy.  Neurological: She is alert and oriented to person, place, and time.  Skin: Skin is warm and dry. She is not diaphoretic. No erythema.  Psychiatric: She has a normal mood and affect. Her behavior is normal.      BP 125/77   Pulse 93   Temp 98 F (36.7 C) (Oral)   Resp 16   Ht  (1.651 m)   Wt 192 lb (87.1 kg)   LMP 04/02/2017   SpO2 99%   BMI 31.95 kg/m      Assessment & Plan:  Will try  Check b12 and d with the next labs.  1. Menorrhagia with regular cycle   2. Dysmenorrhea   3. Difficulty concentrating   4. Easy bruising   Has HA with focalin XR so will try the amephatamine XR  Orders Placed This Encounter  Procedures  . TSH  . Protime-INR  . TSH  . Care order/instruction:    AVS printed - let patient go!  Marland Kitchen POCT urine pregnancy  . POCT CBC    Meds ordered this encounter   Medications  . amphetamine-dextroamphetamine (ADDERALL) 10 MG tablet    Sig: Take 1 tablet (10 mg total) by mouth 2 (two) times daily.    Dispense:  60 tablet    Refill:  0  . norgestimate-ethinyl estradiol (ORTHO-CYCLEN,SPRINTEC,PREVIFEM) 0.25-35 MG-MCG tablet    Sig: Take 1 tablet by mouth daily.    Dispense:  3 Package    Refill:  4      [name], M.D.  Primary Care at [facility] [address] [place], [place] [address] [phone] phone [phone] fax  04/20/17 3:38 AM

## 2017-04-05 NOTE — Patient Instructions (Addendum)
IF you received an x-ray today, you will receive an invoice from [facility]. Please contact [facility] at [phone] with questions or concerns regarding your invoice.   IF you received labwork today, you will receive an invoice from [place]. Please contact [facility] at [phone] with questions or concerns regarding your invoice.   Our billing staff will not be able to assist you with questions regarding bills from these companies.  You will be contacted with the lab results as soon as they are available. The fastest way to get your results is to activate your My Chart account. Instructions are located on the last page of this paperwork. If you have not heard from [place] regarding the results in 2 weeks, please contact this office.    Oral Contraception Use Oral contraceptive pills (OCPs) are medicines taken to prevent pregnancy. OCPs work by preventing the ovaries from releasing eggs. The hormones in OCPs also cause the cervical mucus to thicken, preventing the sperm from entering the uterus. The hormones also cause the uterine lining to become thin, not allowing a fertilized egg to attach to the inside of the uterus. OCPs are highly effective when taken exactly as prescribed. However, OCPs do not prevent sexually transmitted diseases (STDs). Safe sex practices, such as using condoms along with an OCP, can help prevent STDs. Before taking OCPs, you may have a physical exam and Pap test. Your health care provider may also order blood tests if necessary. Your health care provider will make sure you are a good candidate for oral contraception. Discuss with your health care provider the possible side effects of the OCP you may be prescribed. When starting an OCP, it can take 2 to 3 months for the body to adjust to the changes in hormone levels in your body. How to take oral contraceptive pills Your health care provider may advise you on how to start taking the first cycle of  OCPs. Otherwise, you can:  Start on day 1 of your menstrual period. You will not need any backup contraceptive protection with this start time.  Start on the first Sunday after your menstrual period or the day you get your prescription. In these cases, you will need to use backup contraceptive protection for the first week.  Start the pill at any time of your cycle. If you take the pill within 5 days of the start of your period, you are protected against pregnancy right away. In this case, you will not need a backup form of birth control. If you start at any other time of your menstrual cycle, you will need to use another form of birth control for 7 days. If your OCP is the type called a minipill, it will protect you from pregnancy after taking it for 2 days (48 hours). After you have started taking OCPs:  If you forget to take 1 pill, take it as soon as you remember. Take the next pill at the regular time.  If you miss 2 or more pills, call your health care provider because different pills have different instructions for missed doses. Use backup birth control until your next menstrual period starts.  If you use a 28-day pack that contains inactive pills and you miss 1 of the last 7 pills (pills with no hormones), it will not matter. Throw away the rest of the non-hormone pills and start a new pill pack. No matter which day you start the OCP, you will always start a new pack on  that same day of the week. Have an extra pack of OCPs and a backup contraceptive method available in case you miss some pills or lose your OCP pack. Follow these instructions at home:  Do not smoke.  Always use a condom to protect against STDs. OCPs do not protect against STDs.  Use a calendar to mark your menstrual period days.  Read the information and directions that came with your OCP. Talk to your health care provider if you have questions. Contact a health care provider if:  You develop nausea and  vomiting.  You have abnormal vaginal discharge or bleeding.  You develop a rash.  You miss your menstrual period.  You are losing your hair.  You need treatment for mood swings or depression.  You get dizzy when taking the OCP.  You develop acne from taking the OCP.  You become pregnant. Get help right away if:  You develop chest pain.  You develop shortness of breath.  You have an uncontrolled or severe headache.  You develop numbness or slurred speech.  You develop visual problems.  You develop pain, redness, and swelling in the legs. This information is not intended to replace advice given to you by your health care provider. Make sure you discuss any questions you have with your health care provider. Document Released: 11/12/2011 Document Revised: 04/30/2016 Document Reviewed: 05/14/2013 Elsevier Interactive Patient Education  2017 [employer]   Oral Contraception Information Oral contraceptive pills (OCPs) are medicines taken to prevent pregnancy. OCPs work by preventing the ovaries from releasing eggs. The hormones in OCPs also cause the cervical mucus to thicken, preventing the sperm from entering the uterus. The hormones also cause the uterine lining to become thin, not allowing a fertilized egg to attach to the inside of the uterus. OCPs are highly effective when taken exactly as prescribed. However, OCPs do not prevent sexually transmitted diseases (STDs). Safe sex practices, such as using condoms along with the pill, can help prevent STDs. Before taking the pill, you may have a physical exam and Pap test. Your health care provider may order blood tests. The health care provider will make sure you are a good candidate for oral contraception. Discuss with your health care provider the possible side effects of the OCP you may be prescribed. When starting an OCP, it can take 2 to 3 months for the body to adjust to the changes in hormone levels in your body. Types of  oral contraception  The combination pill-This pill contains estrogen and progestin (synthetic progesterone) hormones. The combination pill comes in 21-day, 28-day, or 91-day packs. Some types of combination pills are meant to be taken continuously (365-day pills). With 21-day packs, you do not take pills for 7 days after the last pill. With 28-day packs, the pill is taken every day. The last 7 pills are without hormones. Certain types of pills have more than 21 hormone-containing pills. With 91-day packs, the first 84 pills contain both hormones, and the last 7 pills contain no hormones or contain estrogen only.  The minipill-This pill contains the progesterone hormone only. The pill is taken every day continuously. It is very important to take the pill at the same time each day. The minipill comes in packs of 28 pills. All 28 pills contain the hormone. Advantages of oral contraceptive pills  Decreases premenstrual symptoms.  Treats menstrual period cramps.  Regulates the menstrual cycle.  Decreases a heavy menstrual flow.  May treatacne, depending on the type of pill.  Treats abnormal uterine bleeding.  Treats polycystic ovarian syndrome.  Treats endometriosis.  Can be used as emergency contraception. Things that can make oral contraceptive pills less effective OCPs can be less effective if:  You forget to take the pill at the same time every day.  You have a stomach or intestinal disease that lessens the absorption of the pill.  You take OCPs with other medicines that make OCPs less effective, such as antibiotics, certain HIV medicines, and some seizure medicines.  You take expired OCPs.  You forget to restart the pill on day 7, when using the packs of 21 pills. Risks associated with oral contraceptive pills Oral contraceptive pills can sometimes cause side effects, such as:  Headache.  Nausea.  Breast tenderness.  Irregular bleeding or spotting. Combination pills are  also associated with a small increased risk of:  Blood clots.  Heart attack.  Stroke. This information is not intended to replace advice given to you by your health care provider. Make sure you discuss any questions you have with your health care provider. Document Released: 02/13/2003 Document Revised: 04/30/2016 Document Reviewed: 05/14/2013 Elsevier Interactive Patient Education  2017 [employer].

## 2017-04-06 LAB — PROTIME-INR
INR: 1 (ref 0.8–1.2)
Prothrombin Time: 10.3 s (ref 9.1–12.0)

## 2017-04-06 LAB — TSH: TSH: 1.33 u[IU]/mL (ref 0.450–4.500)

## 2017-04-14 ENCOUNTER — Encounter: Payer: Self-pay | Admitting: Family Medicine

## 2017-05-29 ENCOUNTER — Encounter: Payer: Self-pay | Admitting: Family Medicine

## 2017-06-11 ENCOUNTER — Encounter: Payer: Self-pay | Admitting: Family Medicine

## 2017-06-14 ENCOUNTER — Ambulatory Visit: Payer: BLUE CROSS/BLUE SHIELD | Admitting: Family Medicine

## 2017-06-14 NOTE — Progress Notes (Deleted)
   Subjective:    Patient ID: Deborah White, female    DOB: 06/23/1986, 31 y.o.   MRN: [id number]  HPI    Review of Systems     Objective:   Physical Exam        Assessment & Plan:  Check b12 and d with the next labs.

## 2017-08-14 ENCOUNTER — Ambulatory Visit (INDEPENDENT_AMBULATORY_CARE_PROVIDER_SITE_OTHER): Payer: BLUE CROSS/BLUE SHIELD | Admitting: Family Medicine

## 2017-08-14 ENCOUNTER — Encounter: Payer: Self-pay | Admitting: Family Medicine

## 2017-08-14 VITALS — BP 108/70 | HR 81 | Temp 97.8°F | Resp 18 | Ht 65.0 in | Wt 194.2 lb

## 2017-08-14 DIAGNOSIS — Z3169 Encounter for other general counseling and advice on procreation: Secondary | ICD-10-CM

## 2017-08-14 DIAGNOSIS — L239 Allergic contact dermatitis, unspecified cause: Secondary | ICD-10-CM | POA: Diagnosis not present

## 2017-08-14 DIAGNOSIS — B001 Herpesviral vesicular dermatitis: Secondary | ICD-10-CM | POA: Diagnosis not present

## 2017-08-14 LAB — POCT SKIN KOH: Skin KOH, POC: NEGATIVE

## 2017-08-14 MED ORDER — HALOBETASOL PROPIONATE 0.05 % EX OINT: TOPICAL_OINTMENT | Freq: Two times a day (BID) | CUTANEOUS | 0 refills | Status: DC

## 2017-08-14 MED ORDER — PRENATAL 27-1 MG PO TABS: 1.0000 | ORAL_TABLET | Freq: Every day | ORAL | 4 refills | Status: DC

## 2017-08-14 MED ORDER — VALACYCLOVIR HCL 1 G PO TABS: 2000.0000 mg | ORAL_TABLET | Freq: Two times a day (BID) | ORAL | 2 refills | Status: DC

## 2017-08-14 NOTE — Patient Instructions (Addendum)
IF you received an x-ray today, you will receive an invoice from [facility]. Please contact [facility] at [phone] with questions or concerns regarding your invoice.   IF you received labwork today, you will receive an invoice from [place]. Please contact [facility] at [phone] with questions or concerns regarding your invoice.   Our billing staff will not be able to assist you with questions regarding bills from these companies.  You will be contacted with the lab results as soon as they are available. The fastest way to get your results is to activate your My Chart account. Instructions are located on the last page of this paperwork. If you have not heard from [place] regarding the results in 2 weeks, please contact this office.    Cold Sore A cold sore, also called a fever blister, is a skin infection that causes small, fluid-filled sores to form inside of the mouth or on the lips, gums, nose, chin, or cheeks. Cold sores can spread to other parts of the body, such as the eyes or fingers. In some people with other medical conditions, cold sores can spread to multiple other body sites, including the genitals. Cold sores can be spread or passed from person to person (contagious) until the sores crust over completely. What are the causes? Cold sores are caused by the herpes simplex virus (HSV-1). HSV-1 is closely related to the virus that causes genital herpes (HSV-2), but these viruses are not the same. Once a person is infected with HSV-1, the virus remains permanently in the body. HSV-1 is spread from person to person through close contact, such as through kissing, touching the affected area, or sharing personal items such as lip balm, razors, or eating utensils. What increases the risk? A cold sore outbreak is more likely to develop in people who:  Are tired, stressed, or sick.  Are menstruating.  Are pregnant.  Take certain medicines.  Are exposed to cold  weather or too much sun.  What are the signs or symptoms? Symptoms of a cold sore outbreak often go through different stages. Here is how a cold sore develops:  Tingling, itching, or burning is felt 1-2 days before the outbreak.  Fluid-filled blisters appear on the lips, inside the mouth, on the nose, or on the cheeks.  The blisters start to ooze clear fluid.  The blisters dry up and a yellow crust appears in its place.  The crust falls off.  Other symptoms include:  Fever.  Sore throat.  Headache.  Muscle aches.  Swollen neck glands.  You also may not have any symptoms. How is this diagnosed? This condition is often diagnosed based on your medical history and a physical exam. Your health care provider may swab your sore and then examine it in the lab. Rarely, blood tests may be done to check for HSV-1. How is this treated? There is no cure for cold sores or HSV-1. There also is no vaccine for HSV-1. Most cold sores go away on their own without treatment within two weeks. Medicines cannot make the infection go away, but medicines can:  Help relieve some of the pain associated with the sores.  Work to stop the virus from multiplying.  Shorten healing time.  Medicines may be in the form of creams, gels, pills, or a shot. Follow these instructions at home: Medicines  Take or apply over-the-counter and prescription medicines only as told by your health care provider.  Use a cotton-tip swab to apply creams  or gels to your sores. Sore Care  Do not touch the sores or pick the scabs.  Wash your hands often. Do not touch your eyes without washing your hands first.  Keep the sores clean and dry.  If directed, apply ice to the sores: ? Put ice in a plastic bag. ? Place a towel between your skin and the bag. ? Leave the ice on for 20 minutes, 2-3 times per day. Lifestyle  Do not kiss, have oral sex, or share personal items until your sores heal.  Eat a soft, bland  diet. Avoid eating hot, cold, or salty foods. These can hurt your mouth.  Use a straw if it hurts to drink out of a glass.  Avoid the sun and limit your stress if these things trigger outbreaks. If sun causes cold sores, apply sunscreen on your lips before being out in the sun. Contact a health care provider if:  You have symptoms for more than two weeks.  You have pus coming from the sores.  You have redness that is spreading.  You have pain or irritation in your eye.  You get sores on your genitals.  Your sores do not heal within two weeks.  You have frequent cold sore outbreaks. Get help right away if:  You have a fever and your symptoms suddenly get worse.  You have a headache and confusion. This information is not intended to replace advice given to you by your health care provider. Make sure you discuss any questions you have with your health care provider. Document Released: 11/20/2000 Document Revised: 07/17/2016 Document Reviewed: 09/13/2015 Elsevier Interactive Patient Education  2018 [employer].  Preparing for Pregnancy If you are considering becoming pregnant, make an appointment to see your regular health care provider to learn how to prepare for a safe and healthy pregnancy (preconception care). During a preconception care visit, your health care provider will:  Do a complete physical exam, including a Pap test.  Take a complete medical history.  Give you information, answer your questions, and help you resolve problems.  Preconception checklist Medical history  Tell your health care provider about any current or past medical conditions. Your pregnancy or your ability to become pregnant may be affected by chronic conditions, such as diabetes, chronic hypertension, and thyroid problems.  Include your family's medical history as well as your partner's medical history.  Tell your health care provider about any history of STIs (sexually transmitted  infections).These can affect your pregnancy. In some cases, they can be passed to your baby. Discuss any concerns that you have about STIs.  If indicated, discuss the benefits of genetic testing. This testing will show whether there are any genetic conditions that may be passed from you or your partner to your baby.  Tell your health care provider about: ? Any problems you have had with conception or pregnancy. ? Any medicines you take. These include vitamins, herbal supplements, and over-the-counter medicines. ? Your history of immunizations. Discuss any vaccinations that you may need.  Diet  Ask your health care provider what to include in a healthy diet that has a balance of nutrients. This is especially important when you are pregnant or preparing to become pregnant.  Ask your health care provider to help you reach a healthy weight before pregnancy. ? If you are overweight, you may be at higher risk for certain complications, such as high blood pressure, diabetes, and preterm birth. ? If you are underweight, you are more likely to  have a baby who has a low birth weight.  Lifestyle, work, and home  Let your health care provider know: ? About any lifestyle habits that you have, such as alcohol use, drug use, or smoking. ? About recreational activities that may put you at risk during pregnancy, such as downhill skiing and certain exercise programs. ? Tell your health care provider about any international travel, especially any travel to places with an active [place] outbreak. ? About harmful substances that you may be exposed to at work or at home. These include chemicals, pesticides, radiation, or even litter boxes. ? If you do not feel safe at home.  Mental health  Tell your health care provider about: ? Any history of mental health conditions, including feelings of depression, sadness, or anxiety. ? Any medicines that you take for a mental health condition. These include herbs  and supplements.  Home instructions to prepare for pregnancy Lifestyle  Eat a balanced diet. This includes fresh fruits and vegetables, whole grains, lean meats, low-fat dairy products, healthy fats, and foods that are high in fiber. Ask to meet with a nutritionist or registered dietitian for assistance with meal planning and goals.  Get regular exercise. Try to be active for at least 30 minutes a day on most days of the week. Ask your health care provider which activities are safe during pregnancy.  Do not use any products that contain nicotine or tobacco, such as cigarettes and e-cigarettes. If you need help quitting, ask your health care provider.  Do not drink alcohol.  Do not take illegal drugs.  Maintain a healthy weight. Ask your health care provider what weight range is right for you.  General instructions  Keep an accurate record of your menstrual periods. This makes it easier for your health care provider to determine your baby's due date.  Begin taking prenatal vitamins and folic acid supplements daily as directed by your health care provider.  Manage any chronic conditions, such as high blood pressure and diabetes, as told by your health care provider. This is important.  How do I know that I am pregnant? You may be pregnant if you have been sexually active and you miss your period. Symptoms of early pregnancy include:  Mild cramping.  Very light vaginal bleeding (spotting).  Feeling unusually tired.  Nausea and vomiting (morning sickness).  If you have any of these symptoms and you suspect that you might be pregnant, you can take a home pregnancy test. These tests check for a hormone in your urine (human chorionic gonadotropin, or hCG). A woman's body begins to make this hormone during early pregnancy. These tests are very accurate. Wait until at least the first day after you miss your period to take one. If the test shows that you are pregnant (you get a positive  result), call your health care provider to make an appointment for prenatal care. What should I do if I become pregnant?  Make an appointment with your health care provider as soon as you suspect you are pregnant.  Do not use any products that contain nicotine, such as cigarettes, chewing tobacco, and e-cigarettes. If you need help quitting, ask your health care provider.  Do not drink alcoholic beverages. Alcohol is related to a number of birth defects.  Avoid toxic odors and chemicals.  You may continue to have sexual intercourse if it does not cause pain or other problems, such as vaginal bleeding. This information is not intended to replace advice given to  you by your health care provider. Make sure you discuss any questions you have with your health care provider. Document Released: 11/05/2008 Document Revised: 07/21/2016 Document Reviewed: 06/14/2016 Elsevier Interactive Patient Education  2017 Elsevier Inc.  Pregnancy Test Information What is a pregnancy test? A pregnancy test is used to detect the presence of human chorionic gonadotropin (hCG) in a sample of your urine or blood. hCG is a hormone produced by the cells of the placenta. The placenta is the organ that forms to nourish and support a developing baby. This test requires a sample of either blood or urine. A pregnancy test determines whether you are pregnant or not. How are pregnancy tests done? Pregnancy tests are done using a home pregnancy test or having a blood or urine test done at your health care provider's office. Home pregnancy tests require a urine sample.  Most kits use a plastic testing device with a strip of paper that indicates whether there is hCG in your urine.  Follow the test instructions very carefully.  After you urinate on the test stick, markings will appear to let you know whether you are pregnant.  For best results, use your first urine of the morning. That is when the concentration of hCG is  highest.  Having a blood test to check for pregnancy requires a sample of blood drawn from a vein in your hand or arm. Your health care provider will send your sample to a lab for testing. Results of a pregnancy test will be positive or negative. Is one type of pregnancy test better than another? In some cases, a blood test will return a positive result even if a urine test was negative because blood tests are more sensitive. This means blood tests can detect hCG earlier than home pregnancy tests. How accurate are home pregnancy tests? Both types of pregnancy tests are very accurate.  A blood test is about 98% accurate.  When you are far enough along in your pregnancy and when used correctly, home pregnancy tests are equally accurate.  Can anything interfere with home pregnancy test results It is possible for certain conditions to cause an inaccurate test result (false positive or falsenegative).  A false positive is a positive test result when you are not pregnant. This can happen if you: ? Are taking certain medicines, including anticonvulsants or tranquilizers. ? Have certain proteins in your blood.  A false negative is a negative test result when you are pregnant. This can happen if you: ? Took the test before there was enough hCG to detect. A pregnancy test will not be positive in most women until 3-4 weeks after conception. ? Drank a lot of liquid before the test. Diluted urine samples can sometimes give an inaccurate result. ? Take certain medicines, such as water pills (diuretics) or some antihistamines.  What should I do if I have a positive pregnancy test? If you have a positive pregnancy test, schedule an appointment with your health care provider. You might need additional testing to confirm the pregnancy. In the meantime, begin taking a prenatal vitamin, stop smoking, stop drinking alcohol, and do not use street drugs. Talk to your health care provider about how to take care  of yourself during your pregnancy. Ask about what to expect from the care you will need throughout pregnancy (prenatal care). This information is not intended to replace advice given to you by your health care provider. Make sure you discuss any questions you have with your health care provider. Document  Released: 11/26/2003 Document Revised: 10/20/2016 Document Reviewed: 03/20/2014 Elsevier Interactive Patient Education  2017 Elsevier Inc.    Natural Family Planning Introduction Natural Family Planning (NFP) is a type of birth control without using any form of contraception. Women who use NFP should not have sexual intercourse when the ovary produces an egg (ovulation) during the menstrual cycle. The NFP method is safe and can prevent pregnancy. It is 75% effective when practiced right. The man needs to also understand this method of birth control and the woman needs to be aware of how her body functions during her menstrual cycle. NFP can also be used as a method of getting pregnant. HOW THE NFP METHOD WORKS  A woman's menstrual period usually happens every 28-30 days (it can vary from 23-35 days).  Ovulation happens 12-14 days before the start of the next menstrual period (the fertile period). The egg is fertile for 24 hours and the sperm can live for 3 days or more. If there is sexual intercourse at this time, pregnancy can occur. THERE ARE MANY TYPES OF NFP METHODS USED TO PREVENT PREGNANCY  The basal body temperature method. Often times, there is a slight increase of body temperature when a woman ovulates. Take your temperature every morning before getting out of bed. Write the temperature on a chart. An increase in the temperature shows ovulation has happened. Do not have sexual intercourse from the menstrual period up to three days after the increase in the temperature. Note that the body temperature may increase as a result of fever, restless sleep, and working schedules.  The ovulation  cervical mucus method. During the menstrual cycle, the cervical mucus changes from dry and sticky to wet and slippery. Check the mucus of the vagina every day to look for these changes. Just before ovulation, the mucus becomes wet and slippery. On the last day of wetness, ovulation happens. To avoid getting pregnant, sexual intercourse is safe for about 10 days after the menstrual period and on the dry mucus days. Do not have sexual intercourse when the mucus starts to show up and not until 4 days after the wet and slippery mucus goes away. Sexual intercourse after the 4 days have passed until the menstrual period starts is a safe time. Note that the mucus from the vagina can increase because of a vaginal or cervical infection, lubricants, some medicines, and sexual excitement.  The symptothermal method. This method uses both the temperature and the ovulation methods. Combine the two methods above to prevent pregnancy.  The calendar method. Record your menstrual periods and length of the cycles for 6 months. This is helpful when the menstrual cycle varies in the length of the cycle. The length of a menstrual cycle is from day 1 of the present menstrual period to day 1 of the next menstrual period. Then, find your fertile days of the month and do not have sexual intercourse during that time. You may need help from your health care provider to find out your fertile days. There are some signs of ovulation that may be helpful when trying to find the time of ovulation. This includes vaginal spotting or abdominal cramps during the middle of your menstrual cycle. Not all women have these symptoms. YOU SHOULD NOT USE NFP IF:  You have very irregular menstrual periods and may skip months.  You have abnormal bleeding.  You have a vaginal or cervical infection.  You are on medicines that can affect the vaginal mucus or body temperature. These medicines  include antibiotics, thyroid medicines, and antihistamines  (cold and allergy medicine). This information is not intended to replace advice given to you by your health care provider. Make sure you discuss any questions you have with your health care provider. Document Released: 05/11/2008 Document Revised: 04/30/2016 Document Reviewed: 05/26/2013 Elsevier Interactive Patient Education  2017 [employer] Inc.  Prenatal Care WHAT IS PRENATAL CARE? Prenatal care is the process of caring for a pregnant woman before she gives birth. Prenatal care makes sure that she and her baby remain as healthy as possible throughout pregnancy. Prenatal care may be provided by a midwife, family practice health care provider, or a childbirth and pregnancy specialist (obstetrician). Prenatal care may include physical examinations, testing, treatments, and education on nutrition, lifestyle, and social support services. WHY IS PRENATAL CARE SO IMPORTANT? Early and consistent prenatal care increases the chance that you and your baby will remain healthy throughout your pregnancy. This type of care also decreases a baby's risk of being born too early (prematurely), or being born smaller than expected (small for gestational age). Any underlying medical conditions you may have that could pose a risk during your pregnancy are discussed during prenatal care visits. You will also be monitored regularly for any new conditions that may arise during your pregnancy so they can be treated quickly and effectively. WHAT HAPPENS DURING PRENATAL CARE VISITS? Prenatal care visits may include the following: Discussion Tell your health care provider about any new signs or symptoms you have experienced since your last visit. These might include:  Nausea or vomiting.  Increased or decreased level of energy.  Difficulty sleeping.  Back or leg pain.  Weight changes.  Frequent urination.  Shortness of breath with physical activity.  Changes in your skin, such as the development of a rash or  itchiness.  Vaginal discharge or bleeding.  Feelings of excitement or nervousness.  Changes in your baby's movements.  You may want to write down any questions or topics you want to discuss with your health care provider and bring them with you to your appointment. Examination During your first prenatal care visit, you will likely have a complete physical exam. Your health care provider will often examine your vagina, cervix, and the position of your uterus, as well as check your heart, lungs, and other body systems. As your pregnancy progresses, your health care provider will measure the size of your uterus and your baby's position inside your uterus. He or she may also examine you for early signs of labor. Your prenatal visits may also include checking your blood pressure and, after about 10-12 weeks of pregnancy, listening to your baby's heartbeat. Testing Regular testing often includes:  Urinalysis. This checks your urine for glucose, protein, or signs of infection.  Blood count. This checks the levels of white and red blood cells in your body.  Tests for sexually transmitted infections (STIs). Testing for STIs at the beginning of pregnancy is routinely done and is required in many states.  Antibody testing. You will be checked to see if you are immune to certain illnesses, such as rubella, that can affect a developing fetus.  Glucose screen. Around 24-28 weeks of pregnancy, your blood glucose level will be checked for signs of gestational diabetes. Follow-up tests may be recommended.  Group B strep. This is a bacteria that is commonly found inside a woman's vagina. This test will inform your health care provider if you need an antibiotic to reduce the amount of this bacteria in your body prior  to labor and childbirth.  Ultrasound. Many pregnant women undergo an ultrasound screening around 18-20 weeks of pregnancy to evaluate the health of the fetus and check for any developmental  abnormalities.  HIV (human immunodeficiency virus) testing. Early in your pregnancy, you will be screened for HIV. If you are at high risk for HIV, this test may be repeated during your third trimester of pregnancy.  You may be offered other testing based on your age, personal or family medical history, or other factors. HOW OFTEN SHOULD I PLAN TO SEE MY HEALTH CARE PROVIDER FOR PRENATAL CARE? Your prenatal care check-up schedule depends on any medical conditions you have before, or develop during, your pregnancy. If you do not have any underlying medical conditions, you will likely be seen for checkups:  Monthly, during the first 6 months of pregnancy.  Twice a month during months 7 and 8 of pregnancy.  Weekly starting in the 9th month of pregnancy and until delivery.  If you develop signs of early labor or other concerning signs or symptoms, you may need to see your health care provider more often. Ask your health care provider what prenatal care schedule is best for you. WHAT CAN I DO TO KEEP MYSELF AND MY BABY AS HEALTHY AS POSSIBLE DURING MY PREGNANCY?  Take a prenatal vitamin containing 400 micrograms (0.4 mg) of folic acid every day. Your health care provider may also ask you to take additional vitamins such as iodine, vitamin D, iron, copper, and zinc.  Take 1500-2000 mg of calcium daily starting at your 20th week of pregnancy until you deliver your baby.  Make sure you are up to date on your vaccinations. Unless directed otherwise by your health care provider: ? You should receive a tetanus, diphtheria, and pertussis (Tdap) vaccination between the 27th and 36th week of your pregnancy, regardless of when your last Tdap immunization occurred. This helps protect your baby from whooping cough (pertussis) after he or she is born. ? You should receive an annual inactivated influenza vaccine (IIV) to help protect you and your baby from influenza. This can be done at any point during your  pregnancy.  Eat a well-rounded diet that includes: ? Fresh fruits and vegetables. ? Lean proteins. ? Calcium-rich foods such as milk, yogurt, hard cheeses, and dark, leafy greens. ? Whole grain breads.  Do noteat seafood high in mercury, including: ? Swordfish. ? Tilefish. ? Shark. ? King mackerel. ? More than 6 oz tuna per week.  Do not eat: ? Raw or undercooked meats or eggs. ? Unpasteurized foods, such as soft cheeses (brie, blue, or feta), juices, and milks. ? Lunch meats. ? Hot dogs that have not been heated until they are steaming.  Drink enough water to keep your urine clear or pale yellow. For many women, this may be 10 or more 8 oz glasses of water each day. Keeping yourself hydrated helps deliver nutrients to your baby and may prevent the start of pre-term uterine contractions.  Do not use any tobacco products including cigarettes, chewing tobacco, or electronic cigarettes. If you need help quitting, ask your health care provider.  Do not drink beverages containing alcohol. No safe level of alcohol consumption during pregnancy has been determined.  Do not use any illegal drugs. These can harm your developing baby or cause a miscarriage.  Ask your health care provider or pharmacist before taking any prescription or over-the-counter medicines, herbs, or supplements.  Limit your caffeine intake to no more than 200 mg per  day.  Exercise. Unless told otherwise by your health care provider, try to get 30 minutes of moderate exercise most days of the week. Do not  do high-impact activities, contact sports, or activities with a high risk of falling, such as horseback riding or downhill skiing.  Get plenty of rest.  Avoid anything that raises your body temperature, such as hot tubs and saunas.  If you own a cat, do not empty its litter box. Bacteria contained in cat feces can cause an infection called toxoplasmosis. This can result in serious harm to the fetus.  Stay away  from chemicals such as insecticides, lead, mercury, and cleaning or paint products that contain solvents.  Do not have any X-rays taken unless medically necessary.  Take a childbirth and breastfeeding preparation class. Ask your health care provider if you need a referral or recommendation.  This information is not intended to replace advice given to you by your health care provider. Make sure you discuss any questions you have with your health care provider. Document Released: 11/26/2003 Document Revised: 04/27/2016 Document Reviewed: 02/07/2014 Elsevier Interactive Patient Education  2017 [employer].

## 2017-08-14 NOTE — Progress Notes (Addendum)
Subjective:  By signing my name below, I, Essence Howell, attest that this documentation has been prepared under the direction and in the presence of [name], MD Electronically Signed: Charline Bills, ED Scribe 08/14/2017 at 10:58 AM.   Patient ID: Deborah White, female    DOB: 09-Apr-1986, 30 y.o.   MRN: [id number]  Chief Complaint  Patient presents with  . Pregnancy questions    stopped taking BC in July and wants to get pregnant.   . Mouth Lesions    x3 days slightly painful, better than it was yesterday was much bigger yesterday than it was today    HPI Deborah White is a 31 y.o. female who presents to Primary Care at [facility] for questions regarding pregnancy. Pt recently got married in August and stopped taking her [facility] in July as she wants to conceive. She reports normal menstrual periods and has noticed thicker mucous approximately 1 week following her menstrual period. States Adderall causes abdominal pain when she does not take it with meals. She only took the medication when she was in school and working but states that she is now only working so she stopped the medication 2 weeks ago. States she stopped all medications ~2 weeks ago.   Wt Readings from Last 3 Encounters:  08/14/17 194 lb 3.2 oz (88.1 kg)  04/05/17 192 lb (87.1 kg)  03/08/17 195 lb (88.5 kg)   Mouth Lesions  Pt reports that she was experiencing dental pain ~3 weeks ago and was prescribed antibiotics for 7 days which she completed a little more than 4 days ago. States she noticed gradually improving mouth lesions 3 days ago which were larger and more painful yesterday. She did notice that her lips were dry prior to onset of lesions but denies tingling. Denies drainage from the lesions. No treatments tried PTA. States her brother has a h/o cold sores but her husband does not.   Rash Pt reports recurrent pruritic rash to arms. She has applied Kenalog cream which provides temporary relief but states rash returns  after a few days. 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 Denies chemical exposure or pet exposure.   Past Medical History:  Diagnosis Date  . Anemia   . Thyroid disease    Current Outpatient Prescriptions on File Prior to Visit  Medication Sig Dispense Refill  . amphetamine-dextroamphetamine (ADDERALL) 10 MG tablet Take 1 tablet (10 mg total) by mouth 2 (two) times daily. (Patient not taking: Reported on 08/14/2017) 60 tablet 0  . Cyanocobalamin (VITAMIN B 12 PO) Take by mouth.    . ferrous sulfate 325 (65 FE) MG tablet Take 325 mg by mouth daily with breakfast.    . triamcinolone cream (KENALOG) 0.1 % Apply 1 application topically 3 (three) times daily. (Patient not taking: Reported on 08/14/2017) 80 g 2  . Vitamin D, Ergocalciferol, (DRISDOL) 50000 units CAPS capsule Take 1 capsule (50,000 Units total) by mouth every 7 (seven) days. (Patient not taking: Reported on 08/14/2017) 24 capsule 0   No current facility-administered medications on file prior to visit.    No Known Allergies  History reviewed. No pertinent surgical history. Family History  Problem Relation Age of Onset  . Breast cancer Mother   . Hypertension Mother   . Cancer Maternal Uncle        brain cancer   . Heart disease Maternal Grandmother    Social History   Social History  . Marital status: Single    Spouse name: N/A  . Number  of children: N/A  . Years of education: N/A   Social History Main Topics  . Smoking status: Former Smoker    Quit date: 06/23/2016  . Smokeless tobacco: Never Used  . Alcohol use No  . Drug use: No  . Sexual activity: Not Currently   Other Topics Concern  . None   Social History Narrative  . None   [facility] 08/14/2017 04/05/2017 03/08/2017 02/08/2017 11/10/2016  Decreased Interest 0 0 0 0 0  Down, Depressed, Hopeless 0 0 0 0 0  PHQ - 2 Score 0 0 0 0 0  Altered sleeping - - - - -  Tired, decreased energy - - - - -  Change in appetite - - - - -  Feeling bad or failure about  yourself  - - - - -  Trouble concentrating - - - - -  Moving slowly or fidgety/restless - - - - -  Suicidal thoughts - - - - -  PHQ-9 Score - - - - -  Difficult doing work/chores - - - - -    Review of Systems  HENT: Positive for mouth sores.   Skin: Positive for rash.      Objective:   Physical Exam  Constitutional: She is oriented to person, place, and time. She appears well-developed and well-nourished. No distress.  HENT:  Head: Normocephalic and atraumatic.  Mouth/Throat: Oral lesions present.  Mouth: 1 cm lesion with central fluctuance and blistering on inner aspect and 3 blisters on labial line.  Eyes: Conjunctivae and EOM are normal.  Neck: Neck supple. No tracheal deviation present.  Cardiovascular: Normal rate.   Pulmonary/Chest: Effort normal. No respiratory distress.  Musculoskeletal: Normal range of motion.  Neurological: She is alert and oriented to person, place, and time.  Skin: Skin is warm and dry. Rash noted. Rash is macular and papular.  Blanching erythematous raised macular papular rash along hands and flexor creases of L antecubital fossa.  Psychiatric: She has a normal mood and affect. Her behavior is normal.  Nursing note and vitals reviewed.  BP 108/70   Pulse 81   Temp 97.8 F (36.6 C) (Oral)   Resp 18   Ht 5\' 5"  (1.651 m)   Wt 194 lb 3.2 oz (88.1 kg)   LMP 08/08/2017   SpO2 99%   BMI 32.32 kg/m     Results for orders placed or performed in visit on 08/14/17  POCT Skin KOH  Result Value Ref Range   Skin KOH, POC Negative Negative    Assessment & Plan:   1. Allergic contact dermatitis, unspecified trigger   2. Encounter for preconception consultation   3. Herpes labialis     Orders Placed This Encounter  Procedures  . POCT Skin KOH    Meds ordered this encounter  Medications  . PRENATAL 27-1 MG TABS    Sig: Take 1 tablet by mouth daily.    Dispense:  90 each    Refill:  4  . valACYclovir (VALTREX) 1000 MG tablet    Sig: Take  2 tablets (2,000 mg total) by mouth 2 (two) times daily. For 1 day at first sign of outbreak    Dispense:  8 tablet    Refill:  2  . halobetasol (ULTRAVATE) 0.05 % ointment    Sig: Apply topically 2 (two) times daily. To rash until improved    Dispense:  50 g    Refill:  0   Greater than 50% of the 25  minute visit was spent in counseling/coordination of care regarding prenatal counseling, conception.  I personally performed the services described in this documentation, which was scribed in my presence. The recorded information has been reviewed and considered, and addended by me as needed.   [name], M.D.  Primary Care at [facility] [address] [place], [place] [address] [phone] phone [phone] fax  08/16/17 11:38 PM

## 2017-08-19 ENCOUNTER — Encounter: Payer: Self-pay | Admitting: Family Medicine
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
## 2017-08-28 ENCOUNTER — Ambulatory Visit (INDEPENDENT_AMBULATORY_CARE_PROVIDER_SITE_OTHER): Payer: BLUE CROSS/BLUE SHIELD | Admitting: Family Medicine

## 2017-08-28 ENCOUNTER — Encounter: Payer: Self-pay | Admitting: Family Medicine

## 2017-08-28 VITALS — BP 108/73 | HR 68 | Temp 98.5°F | Resp 16 | Ht 65.5 in | Wt 197.2 lb

## 2017-08-28 DIAGNOSIS — Z124 Encounter for screening for malignant neoplasm of cervix: Secondary | ICD-10-CM

## 2017-08-28 DIAGNOSIS — Z1329 Encounter for screening for other suspected endocrine disorder: Secondary | ICD-10-CM | POA: Diagnosis not present

## 2017-08-28 DIAGNOSIS — Z8639 Personal history of other endocrine, nutritional and metabolic disease: Secondary | ICD-10-CM

## 2017-08-28 DIAGNOSIS — Z3009 Encounter for other general counseling and advice on contraception: Secondary | ICD-10-CM | POA: Diagnosis not present

## 2017-08-28 DIAGNOSIS — E538 Deficiency of other specified B group vitamins: Secondary | ICD-10-CM | POA: Diagnosis not present

## 2017-08-28 DIAGNOSIS — Z1389 Encounter for screening for other disorder: Secondary | ICD-10-CM | POA: Diagnosis not present

## 2017-08-28 DIAGNOSIS — Z130 Encounter for screening for diseases of the blood and blood-forming organs and certain disorders involving the immune mechanism: Secondary | ICD-10-CM

## 2017-08-28 DIAGNOSIS — Z0000 Encounter for general adult medical examination without abnormal findings: Secondary | ICD-10-CM

## 2017-08-28 DIAGNOSIS — Z113 Encounter for screening for infections with a predominantly sexual mode of transmission: Secondary | ICD-10-CM | POA: Diagnosis not present

## 2017-08-28 DIAGNOSIS — Z1231 Encounter for screening mammogram for malignant neoplasm of breast: Secondary | ICD-10-CM

## 2017-08-28 DIAGNOSIS — E559 Vitamin D deficiency, unspecified: Secondary | ICD-10-CM

## 2017-08-28 DIAGNOSIS — E611 Iron deficiency: Secondary | ICD-10-CM

## 2017-08-28 DIAGNOSIS — Z803 Family history of malignant neoplasm of breast: Secondary | ICD-10-CM

## 2017-08-28 DIAGNOSIS — Z136 Encounter for screening for cardiovascular disorders: Secondary | ICD-10-CM

## 2017-08-28 DIAGNOSIS — Z1383 Encounter for screening for respiratory disorder NEC: Secondary | ICD-10-CM

## 2017-08-28 LAB — POCT URINALYSIS DIP (MANUAL ENTRY)
Bilirubin, UA: NEGATIVE
Blood, UA: NEGATIVE
Glucose, UA: NEGATIVE mg/dL
Ketones, POC UA: NEGATIVE mg/dL
Leukocytes, UA: NEGATIVE
Nitrite, UA: NEGATIVE
Protein Ur, POC: NEGATIVE mg/dL
Spec Grav, UA: >=1.03 — AB (ref 1.010–1.025)
Urobilinogen, UA: 0.2 E.U./dL
pH, UA: 5.5 (ref 5.0–8.0)

## 2017-08-28 LAB — POCT URINE PREGNANCY: Preg Test, Ur: NEGATIVE

## 2017-08-28 NOTE — Patient Instructions (Addendum)
Look on [place] and try a dance or yoga class. Or consider trying one of the boot camps if you really want to get in to it (not as hard and much more fun than they seem)!  I'd meet you at [place]!!! (email me at [email or web address] if you are going to go and want a buddy - I could probably meet you on a Tuesday or Friday morning at a dance class - I am very uncoordinated but love dancing anyway!).    IF you received an x-ray today, you will receive an invoice from [facility]. Please contact [facility] at [phone] with questions or concerns regarding your invoice.   IF you received labwork today, you will receive an invoice from [facility]. Please contact [facility] at [phone] with questions or concerns regarding your invoice.   Our billing staff will not be able to assist you with questions regarding bills from these companies.  You will be contacted with the lab results as soon as they are available. The fastest way to get your results is to activate your My Chart account. Instructions are located on the last page of this paperwork. If you have not heard from [place] regarding the results in 2 weeks, please contact this office.     Exercising to Lose Weight Exercising can help you to lose weight. In order to lose weight through exercise, you need to do vigorous-intensity exercise. You can tell that you are exercising with vigorous intensity if you are breathing very hard and fast and cannot hold a conversation while exercising. Moderate-intensity exercise helps to maintain your current weight. You can tell that you are exercising at a moderate level if you have a higher heart rate and faster breathing, but you are still able to hold a conversation. How often should I exercise? Choose an activity that you enjoy and set realistic goals. Your health care provider can help you to make an activity plan that works for you. Exercise regularly as directed by your health care provider. This  may include:  Doing resistance training twice each week, such as: ? Push-ups. ? Sit-ups. ? Lifting weights. ? Using resistance bands.  Doing a given intensity of exercise for a given amount of time. Choose from these options: ? 150 minutes of moderate-intensity exercise every week. ? 75 minutes of vigorous-intensity exercise every week. ? A mix of moderate-intensity and vigorous-intensity exercise every week.  Children, pregnant women, people who are out of shape, people who are overweight, and older adults may need to consult a health care provider for individual recommendations. If you have any sort of medical condition, be sure to consult your health care provider before starting a new exercise program. What are some activities that can help me to lose weight?  Walking at a rate of at least 4.5 miles an hour.  Jogging or running at a rate of 5 miles per hour.  Biking at a rate of at least 10 miles per hour.  Lap swimming.  Roller-skating or in-line skating.  Cross-country skiing.  Vigorous competitive sports, such as football, basketball, and soccer.  Jumping rope.  Aerobic dancing. How can I be more active in my day-to-day activities?  Use the stairs instead of the elevator.  Take a walk during your lunch break.  If you drive, park your car farther away from work or school.  If you take public transportation, get off one stop early and walk the rest of the way.  Make all of your phone calls while  standing up and walking around.  Get up, stretch, and walk around every 30 minutes throughout the day. What guidelines should I follow while exercising?  Do not exercise so much that you hurt yourself, feel dizzy, or get very short of breath.  Consult your health care provider prior to starting a new exercise program.  Wear comfortable clothes and shoes with good support.  Drink plenty of water while you exercise to prevent dehydration or heat stroke. Body water is  lost during exercise and must be replaced.  Work out until you breathe faster and your heart beats faster. This information is not intended to replace advice given to you by your health care provider. Make sure you discuss any questions you have with your health care provider. Document Released: 12/26/2010 Document Revised: 04/30/2016 Document Reviewed: 04/26/2014 Elsevier Interactive Patient Education  2018 [place] Maintenance, Female Adopting a healthy lifestyle and getting preventive care can go a long way to promote health and wellness. Talk with your health care provider about what schedule of regular examinations is right for you. This is a good chance for you to check in with your provider about disease prevention and staying healthy. In between checkups, there are plenty of things you can do on your own. Experts have done a lot of research about which lifestyle changes and preventive measures are most likely to keep you healthy. Ask your health care provider for more information. Weight and diet Eat a healthy diet  Be sure to include plenty of vegetables, fruits, low-fat dairy products, and lean protein.  Do not eat a lot of foods high in solid fats, added sugars, or salt.  Get regular exercise. This is one of the most important things you can do for your health. ? Most adults should exercise for at least 150 minutes each week. The exercise should increase your heart rate and make you sweat (moderate-intensity exercise). ? Most adults should also do strengthening exercises at least twice a week. This is in addition to the moderate-intensity exercise.  Maintain a healthy weight  Body mass index (BMI) is a measurement that can be used to identify possible weight problems. It estimates body fat based on height and weight. Your health care provider can help determine your BMI and help you achieve or maintain a healthy weight.  For females 69 years of age and older: ? A  BMI below 18.5 is considered underweight. ? A BMI of 18.5 to 24.9 is normal. ? A BMI of 25 to 29.9 is considered overweight. ? A BMI of 30 and above is considered obese.  Watch levels of cholesterol and blood lipids  You should start having your blood tested for lipids and cholesterol at 31 years of age, then have this test every 5 years.  You may need to have your cholesterol levels checked more often if: ? Your lipid or cholesterol levels are high. ? You are older than 31 years of age. ? You are at high risk for heart disease.  Cancer screening Lung Cancer  Lung cancer screening is recommended for adults 24-46 years old who are at high risk for lung cancer because of a history of smoking.  A yearly low-dose CT scan of the lungs is recommended for people who: ? Currently smoke. ? Have quit within the past 15 years. ? Have at least a 30-pack-year history of smoking. A pack year is smoking an average of one pack of cigarettes a day for 1 year.  Yearly screening should continue until it has been 15 years since you quit.  Yearly screening should stop if you develop a health problem that would prevent you from having lung cancer treatment.  Breast Cancer  Practice breast self-awareness. This means understanding how your breasts normally appear and feel.  It also means doing regular breast self-exams. Let your health care provider know about any changes, no matter how small.  If you are in your 20s or 30s, you should have a clinical breast exam (CBE) by a health care provider every 1-3 years as part of a regular health exam.  If you are 47 or older, have a CBE every year. Also consider having a breast X-ray (mammogram) every year.  If you have a family history of breast cancer, talk to your health care provider about genetic screening.  If you are at high risk for breast cancer, talk to your health care provider about having an MRI and a mammogram every year.  Breast cancer gene  (BRCA) assessment is recommended for women who have family members with BRCA-related cancers. BRCA-related cancers include: ? Breast. ? Ovarian. ? Tubal. ? Peritoneal cancers.  Results of the assessment will determine the need for genetic counseling and BRCA1 and BRCA2 testing.  Cervical Cancer Your health care provider may recommend that you be screened regularly for cancer of the pelvic organs (ovaries, uterus, and vagina). This screening involves a pelvic examination, including checking for microscopic changes to the surface of your cervix (Pap test). You may be encouraged to have this screening done every 3 years, beginning at age 75.  For women ages 49-65, health care providers may recommend pelvic exams and Pap testing every 3 years, or they may recommend the Pap and pelvic exam, combined with testing for human papilloma virus (HPV), every 5 years. Some types of HPV increase your risk of cervical cancer. Testing for HPV may also be done on women of any age with unclear Pap test results.  Other health care providers may not recommend any screening for nonpregnant women who are considered low risk for pelvic cancer and who do not have symptoms. Ask your health care provider if a screening pelvic exam is right for you.  If you have had past treatment for cervical cancer or a condition that could lead to cancer, you need Pap tests and screening for cancer for at least 20 years after your treatment. If Pap tests have been discontinued, your risk factors (such as having a new sexual partner) need to be reassessed to determine if screening should resume. Some women have medical problems that increase the chance of getting cervical cancer. In these cases, your health care provider may recommend more frequent screening and Pap tests.  Colorectal Cancer  This type of cancer can be detected and often prevented.  Routine colorectal cancer screening usually begins at 31 years of age and continues  through 31 years of age.  Your health care provider may recommend screening at an earlier age if you have risk factors for colon cancer.  Your health care provider may also recommend using home test kits to check for hidden blood in the stool.  A small camera at the end of a tube can be used to examine your colon directly (sigmoidoscopy or colonoscopy). This is done to check for the earliest forms of colorectal cancer.  Routine screening usually begins at age 16.  Direct examination of the colon should be repeated every 5-10 years through 31 years of age.  However, you may need to be screened more often if early forms of precancerous polyps or small growths are found.  Skin Cancer  Check your skin from head to toe regularly.  Tell your health care provider about any new moles or changes in moles, especially if there is a change in a mole's shape or color.  Also tell your health care provider if you have a mole that is larger than the size of a pencil eraser.  Always use sunscreen. Apply sunscreen liberally and repeatedly throughout the day.  Protect yourself by wearing long sleeves, pants, a wide-brimmed hat, and sunglasses whenever you are outside.  Heart disease, diabetes, and high blood pressure  High blood pressure causes heart disease and increases the risk of stroke. High blood pressure is more likely to develop in: ? People who have blood pressure in the high end of the normal range (130-139/85-89 mm Hg). ? People who are overweight or obese. ? People who are African American.  If you are 15-59 years of age, have your blood pressure checked every 3-5 years. If you are 68 years of age or older, have your blood pressure checked every year. You should have your blood pressure measured twice-once when you are at a hospital or clinic, and once when you are not at a hospital or clinic. Record the average of the two measurements. To check your blood pressure when you are not at a  hospital or clinic, you can use: ? An automated blood pressure machine at a pharmacy. ? A home blood pressure monitor.  If you are between 28 years and 81 years old, ask your health care provider if you should take aspirin to prevent strokes.  Have regular diabetes screenings. This involves taking a blood sample to check your fasting blood sugar level. ? If you are at a normal weight and have a low risk for diabetes, have this test once every three years after 31 years of age. ? If you are overweight and have a high risk for diabetes, consider being tested at a younger age or more often. Preventing infection Hepatitis B  If you have a higher risk for hepatitis B, you should be screened for this virus. You are considered at high risk for hepatitis B if: ? You were born in a country where hepatitis B is common. Ask your health care provider which countries are considered high risk. ? Your parents were born in a high-risk country, and you have not been immunized against hepatitis B (hepatitis B vaccine). ? You have HIV or AIDS. ? You use needles to inject street drugs. ? You live with someone who has hepatitis B. ? You have had sex with someone who has hepatitis B. ? You get hemodialysis treatment. ? You take certain medicines for conditions, including cancer, organ transplantation, and autoimmune conditions.  Hepatitis C  Blood testing is recommended for: ? Everyone born from [birth year] through 1965. ? Anyone with known risk factors for hepatitis C.  Sexually transmitted infections (STIs)  You should be screened for sexually transmitted infections (STIs) including gonorrhea and chlamydia if: ? You are sexually active and are younger than 31 years of age. ? You are older than 31 years of age and your health care provider tells you that you are at risk for this type of infection. ? Your sexual activity has changed since you were last screened and you are at an increased risk for chlamydia or  gonorrhea. Ask your health care provider if  you are at risk.  If you do not have HIV, but are at risk, it may be recommended that you take a prescription medicine daily to prevent HIV infection. This is called pre-exposure prophylaxis (PrEP). You are considered at risk if: ? You are sexually active and do not regularly use condoms or know the HIV status of your partner(s). ? You take drugs by injection. ? You are sexually active with a partner who has HIV.  Talk with your health care provider about whether you are at high risk of being infected with HIV. If you choose to begin PrEP, you should first be tested for HIV. You should then be tested every 3 months for as long as you are taking PrEP. Pregnancy  If you are premenopausal and you may become pregnant, ask your health care provider about preconception counseling.  If you may become pregnant, take 400 to 800 micrograms (mcg) of folic acid every day.  If you want to prevent pregnancy, talk to your health care provider about birth control (contraception). Osteoporosis and menopause  Osteoporosis is a disease in which the bones lose minerals and strength with aging. This can result in serious bone fractures. Your risk for osteoporosis can be identified using a bone density scan.  If you are 40 years of age or older, or if you are at risk for osteoporosis and fractures, ask your health care provider if you should be screened.  Ask your health care provider whether you should take a calcium or vitamin D supplement to lower your risk for osteoporosis.  Menopause may have certain physical symptoms and risks.  Hormone replacement therapy may reduce some of these symptoms and risks. Talk to your health care provider about whether hormone replacement therapy is right for you. Follow these instructions at home:  Schedule regular health, dental, and eye exams.  Stay current with your immunizations.  Do not use any tobacco products including  cigarettes, chewing tobacco, or electronic cigarettes.  If you are pregnant, do not drink alcohol.  If you are breastfeeding, limit how much and how often you drink alcohol.  Limit alcohol intake to no more than 1 drink per day for nonpregnant women. One drink equals 12 ounces of beer, 5 ounces of wine, or 1 ounces of hard liquor.  Do not use street drugs.  Do not share needles.  Ask your health care provider for help if you need support or information about quitting drugs.  Tell your health care provider if you often feel depressed.  Tell your health care provider if you have ever been abused or do not feel safe at home. This information is not intended to replace advice given to you by your health care provider. Make sure you discuss any questions you have with your health care provider. Document Released: 06/08/2011 Document Revised: 04/30/2016 Document Reviewed: 08/27/2015 Elsevier Interactive Patient Education  [employer].

## 2017-08-30 LAB — CBC WITH DIFFERENTIAL/PLATELET
Basophils Absolute: 0 10*3/uL (ref 0.0–0.2)
Basos: 1 %
EOS (ABSOLUTE): 0.1 10*3/uL (ref 0.0–0.4)
Eos: 2 %
Hematocrit: 37.5 % (ref 34.0–46.6)
Hemoglobin: 12 g/dL (ref 11.1–15.9)
Immature Grans (Abs): 0 10*3/uL (ref 0.0–0.1)
Immature Granulocytes: 0 %
Lymphocytes Absolute: 2.2 10*3/uL (ref 0.7–3.1)
Lymphs: 35 %
MCH: 26.6 pg (ref 26.6–33.0)
MCHC: 32 g/dL (ref 31.5–35.7)
MCV: 83 fL (ref 79–97)
Monocytes Absolute: 0.5 10*3/uL (ref 0.1–0.9)
Monocytes: 8 %
Neutrophils Absolute: 3.5 10*3/uL (ref 1.4–7.0)
Neutrophils: 54 %
Platelets: 242 10*3/uL (ref 150–379)
RBC: 4.51 x10E6/uL (ref 3.77–5.28)
RDW: 13.9 % (ref 12.3–15.4)
WBC: 6.2 10*3/uL (ref 3.4–10.8)

## 2017-08-30 LAB — LIPID PANEL
Chol/HDL Ratio: 3.8 ratio (ref 0.0–4.4)
Cholesterol, Total: 144 mg/dL (ref 100–199)
HDL: 38 mg/dL — ABNORMAL LOW (ref >39)
LDL Calculated: 95 mg/dL (ref 0–99)
Triglycerides: 53 mg/dL (ref 0–149)
VLDL Cholesterol Cal: 11 mg/dL (ref 5–40)

## 2017-08-30 LAB — COMPREHENSIVE METABOLIC PANEL
ALT: 14 IU/L (ref 0–32)
AST: 14 IU/L (ref 0–40)
Albumin/Globulin Ratio: 1.9 (ref 1.2–2.2)
Albumin: 4.5 g/dL (ref 3.5–5.5)
Alkaline Phosphatase: 67 IU/L (ref 39–117)
BUN/Creatinine Ratio: 22 (ref 9–23)
BUN: 15 mg/dL (ref 6–20)
Bilirubin Total: 0.5 mg/dL (ref 0.0–1.2)
CO2: 20 mmol/L (ref 20–29)
Calcium: 9.1 mg/dL (ref 8.7–10.2)
Chloride: 105 mmol/L (ref 96–106)
Creatinine, Ser: 0.68 mg/dL (ref 0.57–1.00)
GFR calc Af Amer: 136 mL/min/{1.73_m2} (ref >59)
GFR calc non Af Amer: 118 mL/min/{1.73_m2} (ref >59)
Globulin, Total: 2.4 g/dL (ref 1.5–4.5)
Glucose: 90 mg/dL (ref 65–99)
Potassium: 4.4 mmol/L (ref 3.5–5.2)
Sodium: 138 mmol/L (ref 134–144)
Total Protein: 6.9 g/dL (ref 6.0–8.5)

## 2017-08-30 LAB — THYROID PANEL WITH TSH
Free Thyroxine Index: 1.7 (ref 1.2–4.9)
T3 Uptake Ratio: 28 % (ref 24–39)
T4, Total: 6.2 ug/dL (ref 4.5–12.0)
TSH: 3.13 u[IU]/mL (ref 0.450–4.500)

## 2017-08-30 LAB — VITAMIN B12: Vitamin B-12: 360 pg/mL (ref 232–1245)

## 2017-08-30 LAB — FERRITIN: Ferritin: 37 ng/mL (ref 15–150)

## 2017-08-30 LAB — VITAMIN D 25 HYDROXY (VIT D DEFICIENCY, FRACTURES): Vit D, 25-Hydroxy: 19.7 ng/mL — ABNORMAL LOW (ref 30.0–100.0)

## 2017-08-30 LAB — HIV ANTIBODY (ROUTINE TESTING W REFLEX): HIV Screen 4th Generation wRfx: NONREACTIVE

## 2017-09-01 LAB — PAP IG, CT-NG NAA, HPV HIGH-RISK
Chlamydia, Nuc. Acid Amp: NEGATIVE
Gonococcus by Nucleic Acid Amp: NEGATIVE
HPV, high-risk: NEGATIVE
PAP Smear Comment: 0

## 2017-10-09 ENCOUNTER — Ambulatory Visit (INDEPENDENT_AMBULATORY_CARE_PROVIDER_SITE_OTHER): Payer: BLUE CROSS/BLUE SHIELD | Admitting: Family Medicine

## 2017-10-09 ENCOUNTER — Encounter: Payer: Self-pay | Admitting: Family Medicine

## 2017-10-09 VITALS — BP 110/72 | HR 67 | Temp 98.3°F | Resp 18 | Ht 64.75 in | Wt 202.6 lb

## 2017-10-09 DIAGNOSIS — N912 Amenorrhea, unspecified: Secondary | ICD-10-CM | POA: Diagnosis not present

## 2017-10-09 DIAGNOSIS — Z3401 Encounter for supervision of normal first pregnancy, first trimester: Secondary | ICD-10-CM

## 2017-10-09 DIAGNOSIS — Z23 Encounter for immunization: Secondary | ICD-10-CM | POA: Diagnosis not present

## 2017-10-09 LAB — POC MICROSCOPIC URINALYSIS (UMFC): Mucus: ABSENT

## 2017-10-09 LAB — POCT URINALYSIS DIP (MANUAL ENTRY)
Bilirubin, UA: NEGATIVE
Blood, UA: NEGATIVE
Glucose, UA: NEGATIVE mg/dL
Ketones, POC UA: NEGATIVE mg/dL
Leukocytes, UA: NEGATIVE
Nitrite, UA: NEGATIVE
Protein Ur, POC: NEGATIVE mg/dL
Spec Grav, UA: >=1.03 — AB (ref 1.010–1.025)
Urobilinogen, UA: 0.2 E.U./dL
pH, UA: 5.5 (ref 5.0–8.0)

## 2017-10-09 LAB — POCT URINE PREGNANCY: Preg Test, Ur: POSITIVE — AB

## 2017-10-09 NOTE — Patient Instructions (Addendum)
Influenza (flu) vaccine-Pregnant women are at especially high risk of developing complications of the flu. Vaccination against the seasonal flu is recommended for all women who are or will be pregnant during influenza season. Influenza vaccine injection (flu shot) during pregnancy has no known harmful effects on the unborn baby, and can help protect the baby from influenza in the first six months after birth, before the baby is eligible for the flu vaccine.   Your due date is Tuesday June 14, 2018. You are 4 weeks and 4 days pregnant. You will be [redacted] weeks pregnant on Tuesday November 6 - so you gain a week every Tuesday.  Check out [facility] if you are interested in a more natural birth outside of a hospital. Check out the following obstetricians. Consider being followed by one of the midwives at a practice:  [facility]  [facility]   IF you received an x-ray today, you will receive an invoice from [facility]. Please contact [facility] at [phone] with questions or concerns regarding your invoice.   IF you received labwork today, you will receive an invoice from [place]. Please contact [facility] at [phone] with questions or concerns regarding your invoice.   Our billing staff will not be able to assist you with questions regarding bills from these companies.  You will be contacted with the lab results as soon as they are available. The fastest way to get your results is to activate your My Chart account. Instructions are located on the last page of this paperwork. If you have not heard from [place] regarding the results in 2 weeks, please contact this office.     First Trimester of Pregnancy The first trimester of pregnancy is from week 1 until the end of week 13 (months 1 through 3). A week after a sperm fertilizes an egg, the egg will implant on the wall of the uterus. This embryo will begin to develop into a baby.  Genes from you and your partner will form the baby. The female genes will determine whether the baby will be a boy or a girl. At 6-8 weeks, the eyes and face will be formed, and the heartbeat can be seen on ultrasound. At the end of 12 weeks, all the baby's organs will be formed. Now that you are pregnant, you will want to do everything you can to have a healthy baby. Two of the most important things are to get good prenatal care and to follow your health care provider's instructions. Prenatal care is all the medical care you receive before the baby's birth. This care will help prevent, find, and treat any problems during the pregnancy and childbirth. Body changes during your first trimester Your body goes through many changes during pregnancy. The changes vary from woman to woman.  You may gain or lose a couple of pounds at first.  You may feel sick to your stomach (nauseous) and you may throw up (vomit). If the vomiting is uncontrollable, call your health care provider.  You may tire easily.  You may develop headaches that can be relieved by medicines. All medicines should be approved by your health care provider.  You may urinate more often. Painful urination may mean you have a bladder infection.  You may develop heartburn as a result of your pregnancy.  You may develop constipation because certain hormones are causing the muscles that push stool through your intestines to slow down.  You may develop hemorrhoids or  swollen veins (varicose veins).  Your breasts may begin to grow larger and become tender. Your nipples may stick out more, and the tissue that surrounds them (areola) may become darker.  Your gums may bleed and may be sensitive to brushing and flossing.  Dark spots or blotches (chloasma, mask of pregnancy) may develop on your face. This will likely fade after the baby is born.  Your menstrual periods will stop.  You may have a loss of appetite.  You may develop cravings  for certain kinds of food.  You may have changes in your emotions from day to day, such as being excited to be pregnant or being concerned that something may go wrong with the pregnancy and baby.  You may have more vivid and strange dreams.  You may have changes in your hair. These can include thickening of your hair, rapid growth, and changes in texture. Some women also have hair loss during or after pregnancy, or hair that feels dry or thin. Your hair will most likely return to normal after your baby is born.  What to expect at prenatal visits During a routine prenatal visit:  You will be weighed to make sure you and the baby are growing normally.  Your blood pressure will be taken.  Your abdomen will be measured to track your baby's growth.  The fetal heartbeat will be listened to between weeks 10 and 14 of your pregnancy.  Test results from any previous visits will be discussed.  Your health care provider may ask you:  How you are feeling.  If you are feeling the baby move.  If you have had any abnormal symptoms, such as leaking fluid, bleeding, severe headaches, or abdominal cramping.  If you are using any tobacco products, including cigarettes, chewing tobacco, and electronic cigarettes.  If you have any questions.  Other tests that may be performed during your first trimester include:  Blood tests to find your blood type and to check for the presence of any previous infections. The tests will also be used to check for low iron levels (anemia) and protein on red blood cells (Rh antibodies). Depending on your risk factors, or if you previously had diabetes during pregnancy, you may have tests to check for high blood sugar that affects pregnant women (gestational diabetes).  Urine tests to check for infections, diabetes, or protein in the urine.  An ultrasound to confirm the proper growth and development of the baby.  Fetal screens for spinal cord problems (spina bifida)  and Down syndrome.  HIV (human immunodeficiency virus) testing. Routine prenatal testing includes screening for HIV, unless you choose not to have this test.  You may need other tests to make sure you and the baby are doing well.  Follow these instructions at home: Medicines  Follow your health care provider's instructions regarding medicine use. Specific medicines may be either safe or unsafe to take during pregnancy.  Take a prenatal vitamin that contains at least 600 micrograms (mcg) of folic acid.  If you develop constipation, try taking a stool softener if your health care provider approves. Eating and drinking  Eat a balanced diet that includes fresh fruits and vegetables, whole grains, good sources of protein such as meat, eggs, or tofu, and low-fat dairy. Your health care provider will help you determine the amount of weight gain that is right for you.  Avoid raw meat and uncooked cheese. These carry germs that can cause birth defects in the baby.  Eating four or  five small meals rather than three large meals a day may help relieve nausea and vomiting. If you start to feel nauseous, eating a few soda crackers can be helpful. Drinking liquids between meals, instead of during meals, also seems to help ease nausea and vomiting.  Limit foods that are high in fat and processed sugars, such as fried and sweet foods.  To prevent constipation: ? Eat foods that are high in fiber, such as fresh fruits and vegetables, whole grains, and beans. ? Drink enough fluid to keep your urine clear or pale yellow. Activity  Exercise only as directed by your health care provider. Most women can continue their usual exercise routine during pregnancy. Try to exercise for 30 minutes at least 5 days a week. Exercising will help you: ? Control your weight. ? Stay in shape. ? Be prepared for labor and delivery.  Experiencing pain or cramping in the lower abdomen or lower back is a good sign that you  should stop exercising. Check with your health care provider before continuing with normal exercises.  Try to avoid standing for long periods of time. Move your legs often if you must stand in one place for a long time.  Avoid heavy lifting.  Wear low-heeled shoes and practice good posture.  You may continue to have sex unless your health care provider tells you not to. Relieving pain and discomfort  Wear a good support bra to relieve breast tenderness.  Take warm sitz baths to soothe any pain or discomfort caused by hemorrhoids. Use hemorrhoid cream if your health care provider approves.  Rest with your legs elevated if you have leg cramps or low back pain.  If you develop varicose veins in your legs, wear support hose. Elevate your feet for 15 minutes, 3-4 times a day. Limit salt in your diet. Prenatal care  Schedule your prenatal visits by the twelfth week of pregnancy. They are usually scheduled monthly at first, then more often in the last 2 months before delivery.  Write down your questions. Take them to your prenatal visits.  Keep all your prenatal visits as told by your health care provider. This is important. Safety  Wear your seat belt at all times when driving.  Make a list of emergency phone numbers, including numbers for family, friends, the hospital, and police and fire departments. General instructions  Ask your health care provider for a referral to a local prenatal education class. Begin classes no later than the beginning of month 6 of your pregnancy.  Ask for help if you have counseling or nutritional needs during pregnancy. Your health care provider can offer advice or refer you to specialists for help with various needs.  Do not use hot tubs, steam rooms, or saunas.  Do not douche or use tampons or scented sanitary pads.  Do not cross your legs for long periods of time.  Avoid cat litter boxes and soil used by cats. These carry germs that can cause birth  defects in the baby and possibly loss of the fetus by miscarriage or stillbirth.  Avoid all smoking, herbs, alcohol, and medicines not prescribed by your health care provider. Chemicals in these products affect the formation and growth of the baby.  Do not use any products that contain nicotine or tobacco, such as cigarettes and e-cigarettes. If you need help quitting, ask your health care provider. You may receive counseling support and other resources to help you quit.  Schedule a dentist appointment. At home, brush your  teeth with a soft toothbrush and be gentle when you floss. Contact a health care provider if:  You have dizziness.  You have mild pelvic cramps, pelvic pressure, or nagging pain in the abdominal area.  You have persistent nausea, vomiting, or diarrhea.  You have a bad smelling vaginal discharge.  You have pain when you urinate.  You notice increased swelling in your face, hands, legs, or ankles.  You are exposed to fifth disease or chickenpox.  You are exposed to [place] measles (rubella) and have never had it. Get help right away if:  You have a fever.  You are leaking fluid from your vagina.  You have spotting or bleeding from your vagina.  You have severe abdominal cramping or pain.  You have rapid weight gain or loss.  You vomit blood or material that looks like coffee grounds.  You develop a severe headache.  You have shortness of breath.  You have any kind of trauma, such as from a fall or a car accident. Summary  The first trimester of pregnancy is from week 1 until the end of week 13 (months 1 through 3).  Your body goes through many changes during pregnancy. The changes vary from woman to woman.  You will have routine prenatal visits. During those visits, your health care provider will examine you, discuss any test results you may have, and talk with you about how you are feeling. This information is not intended to replace advice given to  you by your health care provider. Make sure you discuss any questions you have with your health care provider. Document Released: 11/17/2001 Document Revised: 11/04/2016 Document Reviewed: 11/04/2016 Elsevier Interactive Patient Education  2017 [employer]  Vaginal Bleeding During Pregnancy, First Trimester A small amount of bleeding (spotting) from the vagina is relatively common in early pregnancy. It usually stops on its own. Various things may cause bleeding or spotting in early pregnancy. Some bleeding may be related to the pregnancy, and some may not. In most cases, the bleeding is normal and is not a problem. However, bleeding can also be a sign of something serious. Be sure to tell your health care provider about any vaginal bleeding right away. Some possible causes of vaginal bleeding during the first trimester include:  Infection or inflammation of the cervix.  Growths (polyps) on the cervix.  Miscarriage or threatened miscarriage.  Pregnancy tissue has developed outside of the uterus and in a fallopian tube (tubal pregnancy).  Tiny cysts have developed in the uterus instead of pregnancy tissue (molar pregnancy).  Follow these instructions at home: Watch your condition for any changes. The following actions may help to lessen any discomfort you are feeling:  Follow your health care provider's instructions for limiting your activity. If your health care provider orders bed rest, you may need to stay in bed and only get up to use the bathroom. However, your health care provider may allow you to continue light activity.  If needed, make plans for someone to help with your regular activities and responsibilities while you are on bed rest.  Keep track of the number of pads you use each day, how often you change pads, and how soaked (saturated) they are. Write this down.  Do not use tampons. Do not douche.  Do not have sexual intercourse or orgasms until approved by your health  care provider.  If you pass any tissue from your vagina, save the tissue so you can show it to your health care provider.  Only take over-the-counter or prescription medicines as directed by your health care provider.  Do not take aspirin because it can make you bleed.  Keep all follow-up appointments as directed by your health care provider.  Contact a health care provider if:  You have any vaginal bleeding during any part of your pregnancy.  You have cramps or labor pains.  You have a fever, not controlled by medicine. Get help right away if:  You have severe cramps in your back or belly (abdomen).  You pass large clots or tissue from your vagina.  Your bleeding increases.  You feel light-headed or weak, or you have fainting episodes.  You have chills.  You are leaking fluid or have a gush of fluid from your vagina.  You pass out while having a bowel movement. This information is not intended to replace advice given to you by your health care provider. Make sure you discuss any questions you have with your health care provider. Document Released: 09/02/2005 Document Revised: 04/30/2016 Document Reviewed: 07/31/2013 Elsevier Interactive Patient Education  2018 [employer].   How a Baby Grows During Pregnancy Pregnancy begins when a female's sperm enters a female's egg (fertilization). This happens in one of the tubes (fallopian tubes) that connect the ovaries to the womb (uterus). The fertilized egg is called an embryo until it reaches 10 weeks. From 10 weeks until birth, it is called a fetus. The fertilized egg moves down the fallopian tube to the uterus. Then it implants into the lining of the uterus and begins to grow. The developing fetus receives oxygen and nutrients through the pregnant woman's bloodstream and the tissues that grow (placenta) to support the fetus. The placenta is the life support system for the fetus. It provides nutrition and removes waste. Learning as  much as you can about your pregnancy and how your baby is developing can help you enjoy the experience. It can also make you aware of when there might be a problem and when to ask questions. How long does a typical pregnancy last? A pregnancy usually lasts 280 days, or about 40 weeks. Pregnancy is divided into three trimesters:  First trimester: 0-13 weeks.  Second trimester: 14-27 weeks.  Third trimester: 28-40 weeks.  The day when your baby is considered ready to be born (full term) is your estimated date of delivery. How does my baby develop month by month? First month  The fertilized egg attaches to the inside of the uterus.  Some cells will form the placenta. Others will form the fetus.  The arms, legs, brain, spinal cord, lungs, and heart begin to develop.  At the end of the first month, the heart begins to beat.  Second month  The bones, inner ear, eyelids, hands, and feet form.  The genitals develop.  By the end of 8 weeks, all major organs are developing.  Third month  All of the internal organs are forming.  Teeth develop below the gums.  Bones and muscles begin to grow. The spine can flex.  The skin is transparent.  Fingernails and toenails begin to form.  Arms and legs continue to grow longer, and hands and feet develop.  The fetus is about 3 in (7.6 cm) long.  Fourth month  The placenta is completely formed.  The external sex organs, neck, outer ear, eyebrows, eyelids, and fingernails are formed.  The fetus can hear, swallow, and move its arms and legs.  The kidneys begin to produce urine.  The skin is  covered with a white waxy coating (vernix) and very fine hair (lanugo).  Fifth month  The fetus moves around more and can be felt for the first time (quickening).  The fetus starts to sleep and wake up and may begin to suck its finger.  The nails grow to the end of the fingers.  The organ in the digestive system that makes bile  (gallbladder) functions and helps to digest the nutrients.  If your baby is a girl, eggs are present in her ovaries. If your baby is a boy, testicles start to move down into his scrotum.  Sixth month  The lungs are formed, but the fetus is not yet able to breathe.  The eyes open. The brain continues to develop.  Your baby has fingerprints and toe prints. Your baby's hair grows thicker.  At the end of the second trimester, the fetus is about 9 in (22.9 cm) long.  Seventh month  The fetus kicks and stretches.  The eyes are developed enough to sense changes in light.  The hands can make a grasping motion.  The fetus responds to sound.  Eighth month  All organs and body systems are fully developed and functioning.  Bones harden and taste buds develop. The fetus may hiccup.  Certain areas of the brain are still developing. The skull remains soft.  Ninth month  The fetus gains about  lb (0.23 kg) each week.  The lungs are fully developed.  Patterns of sleep develop.  The fetus's head typically moves into a head-down position (vertex) in the uterus to prepare for birth. If the buttocks move into a vertex position instead, the baby is breech.  The fetus weighs 6-9 lbs (2.72-4.08 kg) and is 19-20 in (48.26-50.8 cm) long.  What can I do to have a healthy pregnancy and help my baby develop? Eating and Drinking  Eat a healthy diet. ? Talk with your health care provider to make sure that you are getting the nutrients that you and your baby need. ? Visit www.[employer].be to learn about creating a healthy diet.  Gain a healthy amount of weight during pregnancy as advised by your health care provider. This is usually 25-35 pounds. You may need to: ? Gain more if you were underweight before getting pregnant or if you are pregnant with more than one baby. ? Gain less if you were overweight or obese when you got pregnant.  Medicines and Vitamins  Take prenatal vitamins as  directed by your health care provider. These include vitamins such as folic acid, iron, calcium, and vitamin D. They are important for healthy development.  Take medicines only as directed by your health care provider. Read labels and ask a pharmacist or your health care provider whether over-the-counter medicines, supplements, and prescription drugs are safe to take during pregnancy.  Activities  Be physically active as advised by your health care provider. Ask your health care provider to recommend activities that are safe for you to do, such as walking or swimming.  Do not participate in strenuous or extreme sports.  Lifestyle  Do not drink alcohol.  Do not use any tobacco products, including cigarettes, chewing tobacco, or electronic cigarettes. If you need help quitting, ask your health care provider.  Do not use illegal drugs.  Safety  Avoid exposure to mercury, lead, or other heavy metals. Ask your health care provider about common sources of these heavy metals.  Avoid listeria infection during pregnancy. Follow these precautions: ? Do not eat soft  cheeses or deli meats. ? Do not eat hot dogs unless they have been warmed up to the point of steaming, such as in the microwave oven. ? Do not drink unpasteurized milk.  Avoid toxoplasmosis infection during pregnancy. Follow these precautions: ? Do not change your cat's litter box, if you have a cat. Ask someone else to do this for you. ? Wear gardening gloves while working in the yard.  General Instructions  Keep all follow-up visits as directed by your health care provider. This is important. This includes prenatal care and screening tests.  Manage any chronic health conditions. Work closely with your health care provider to keep conditions, such as diabetes, under control.  How do I know if my baby is developing well? At each prenatal visit, your health care provider will do several different tests to check on your health  and keep track of your baby's development. These include:  Fundal height. ? Your health care provider will measure your growing belly from top to bottom using a tape measure. ? Your health care provider will also feel your belly to determine your baby's position.  Heartbeat. ? An ultrasound in the first trimester can confirm pregnancy and show a heartbeat, depending on how far along you are. ? Your health care provider will check your baby's heart rate at every prenatal visit. ? As you get closer to your delivery date, you may have regular fetal heart rate monitoring to make sure that your baby is not in distress.  Second trimester ultrasound. ? This ultrasound checks your baby's development. It also indicates your baby's gender.  What should I do if I have concerns about my baby's development? Always talk with your health care provider about any concerns that you may have. This information is not intended to replace advice given to you by your health care provider. Make sure you discuss any questions you have with your health care provider. Document Released: 05/11/2008 Document Revised: 04/30/2016 Document Reviewed: 05/02/2014 Elsevier Interactive Patient Education  [employer].

## 2017-10-09 NOTE — Progress Notes (Addendum)
Subjective:  . 10/09/2017 , 3:40 PM . Patient was seen in Room 1.   Patient ID: Deborah White, female    DOB: 11/23/1986, 31 y.o.   MRN: [id number] Chief Complaint  Patient presents with  . Possible Pregnancy  . Flu Vaccine   HPI Deborah White is a 31 y.o. female who presents to Primary Care at [facility] accompanied by her husband with the joyous news that she thinks she is pregnant but would like it confirmed and has lots of questions. She and her husband have been trying to conceive for the past several months.  She went off OCPs - orthocyclen - 5 mos prior - 06/2017 and have been anxiously awaiting this.  Reports that her periods have been regular ever 4 wks.  Her last menses STARTED 09/07/2017 so it should be starting again now - this weekend.  However, last week for some reason she just got the idea for some undefinable reason that she might be preg so took a preg tests (twice) last Sat night and Sun morning - both +.   She has stopped EVERy medicine - even vitamins - even her PNV that I rx'd her.   Has been exhausted. Sleeping a ton. Worried that there has been no nausea/morning sickness. Does feel hungry though some foods turn her off. No vag d/c, did have a little spotting. Nml urine and bowels  Worried about the heavy lifting at her job and the large trays pressing against her abdomen - works in a [employer].  Past Medical History:  Diagnosis Date  . Anemia   . Thyroid disease    Prior to Admission medications   Medication Sig Start Date End Date Taking? Authorizing Provider  halobetasol (ULTRAVATE) 0.05 % ointment Apply topically 2 (two) times daily. To rash until improved 08/14/17   Sherren MochaShaw, Eva N, MD  PRENATAL 27-1 MG TABS Take 1 tablet by mouth daily. 08/14/17   Sherren MochaShaw, Eva N, MD  valACYclovir (VALTREX) 1000 MG tablet Take 2 tablets (2,000 mg total) by mouth 2 (two) times daily. For 1 day at first sign of outbreak 08/14/17   Sherren MochaShaw, Eva N, MD   No Known Allergies  No  past surgical history on file. Family History  Problem Relation Age of Onset  . Breast cancer Mother   . Hypertension Mother   . Cancer Maternal Uncle        brain cancer   . Heart disease Maternal Grandmother    Social History   Socioeconomic History  . Marital status: Single    Spouse name: None  . Number of children: None  . Years of education: None  . Highest education level: None  Social Needs  . Financial resource strain: None  . Food insecurity - worry: None  . Food insecurity - inability: None  . Transportation needs - medical: None  . Transportation needs - non-medical: None  Occupational History  . None  Tobacco Use  . Smoking status: Former Smoker    Last attempt to quit: 06/23/2016    Years since quitting: 1.3  . Smokeless tobacco: Never Used  Substance and Sexual Activity  . Alcohol use: No    Alcohol/week: 0.0 oz  . Drug use: No  . Sexual activity: Not Currently  Other Topics Concern  . None  Social History Narrative  . None   [facility] 10/09/2017 08/28/2017 08/14/2017 04/05/2017 03/08/2017  Decreased Interest 0 0 0 0 0  Down, Depressed, Hopeless 0 0 0  0 0  PHQ - 2 Score 0 0 0 0 0  Altered sleeping - - - - -  Tired, decreased energy - - - - -  Change in appetite - - - - -  Feeling bad or failure about yourself  - - - - -  Trouble concentrating - - - - -  Moving slowly or fidgety/restless - - - - -  Suicidal thoughts - - - - -  PHQ-9 Score - - - - -  Difficult doing work/chores - - - - -    Review of Systems  Constitutional: Positive for fatigue. Negative for activity change, appetite change, chills, diaphoresis and fever.  Eyes: Negative for visual disturbance.  Respiratory: Negative for cough and shortness of breath.   Cardiovascular: Negative for chest pain, palpitations and leg swelling.  Gastrointestinal: Negative for abdominal distention, abdominal pain, nausea and vomiting.  Genitourinary: Positive for vaginal bleeding (little  light spotting sev d ago). Negative for decreased urine volume, hematuria, menstrual problem and vaginal discharge.  Neurological: Negative for syncope and headaches.  Hematological: Does not bruise/bleed easily.  Psychiatric/Behavioral: Negative for sleep disturbance.       Objective:   Physical Exam  Constitutional: She is oriented to person, place, and time. She appears well-developed and well-nourished. No distress.  HENT:  Head: Normocephalic and atraumatic.  Eyes: Pupils are equal, round, and reactive to light. EOM are normal.  Neck: Neck supple.  Cardiovascular: Normal rate.   Pulmonary/Chest: Effort normal. No respiratory distress.  Musculoskeletal: Normal range of motion.  Neurological: She is alert and oriented to person, place, and time.  Skin: Skin is warm and dry.  Psychiatric: She has a normal mood and affect. Her behavior is normal.  Nursing note and vitals reviewed.   BP 110/72   Pulse 67   Temp 98.3 F (36.8 C) (Oral)   Resp 18   Ht 5' 4.75" (1.645 m)   Wt 202 lb 9.6 oz (91.9 kg)   LMP 09/07/2017   SpO2 99%   BMI 33.98 kg/m   Results for orders placed or performed in visit on 10/09/17  Urine Culture  Result Value Ref Range   Urine Culture, Routine CANCELED   Beta HCG, Quant  Result Value Ref Range   hCG Quant 4,466 mIU/mL  CBC with Differential/Platelet  Result Value Ref Range   WBC 10.0 3.4 - 10.8 x10E3/uL   RBC 4.55 3.77 - 5.28 x10E6/uL   Hemoglobin 12.0 11.1 - 15.9 g/dL   Hematocrit 16.1 09.6 - 46.6 %   MCV 81 79 - 97 fL   MCH 26.4 (L) 26.6 - 33.0 pg   MCHC 32.5 31.5 - 35.7 g/dL   RDW 04.5 40.9 - 81.1 %   Platelets 281 150 - 379 x10E3/uL   Neutrophils 69 Not Estab. %   Lymphs 24 Not Estab. %   Monocytes 6 Not Estab. %   Eos 1 Not Estab. %   Basos 0 Not Estab. %   Neutrophils Absolute 6.9 1.4 - 7.0 x10E3/uL   Lymphocytes Absolute 2.4 0.7 - 3.1 x10E3/uL   Monocytes Absolute 0.6 0.1 - 0.9 x10E3/uL   EOS (ABSOLUTE) 0.1 0.0 - 0.4 x10E3/uL    Basophils Absolute 0.0 0.0 - 0.2 x10E3/uL   Immature Granulocytes 0 Not Estab. %   Immature Grans (Abs) 0.0 0.0 - 0.1 x10E3/uL  POCT urinalysis dipstick  Result Value Ref Range   Color, UA yellow yellow   Clarity, UA clear clear   Glucose, UA  negative negative mg/dL   Bilirubin, UA negative negative   Ketones, POC UA negative negative mg/dL   Spec Grav, UA >=7.829 (A) 1.010 - 1.025   Blood, UA negative negative   pH, UA 5.5 5.0 - 8.0   Protein Ur, POC negative negative mg/dL   Urobilinogen, UA 0.2 0.2 or 1.0 E.U./dL   Nitrite, UA Negative Negative   Leukocytes, UA Negative Negative  POCT Microscopic Urinalysis ([facility])  Result Value Ref Range   WBC,UR,HPF,POC None None WBC/hpf   RBC,UR,HPF,POC None None RBC/hpf   Bacteria None None, Too numerous to count   Mucus Absent Absent   Epithelial Cells, UR Per Microscopy None None, Too numerous to count cells/hpf  POCT urine pregnancy  Result Value Ref Range   Preg Test, Ur Positive (A) Negative       Assessment & Plan:   1. Amenorrhea   2. Encounter for supervision of normal first pregnancy in first trimester - EDD by LMP 06/14/2017 so pt 4 wks and 4d today.  3. Need for prophylactic vaccination and inoculation against influenza - given today      Gave tons of handouts on health pregnancy, pregnancy guidelines, first trimester does/don'ts from [facility].  Reviewed OB care and options. Restart pnv, vit B complex, and vit D. No need to take extra iron at this time - last levels good, no longer menstruating, and likely in pnv. Ok to cont same work duties, no need for restrictions this early in preg.  Orders Placed This Encounter  Procedures  . Urine Culture  . Flu Vaccine QUAD 36+ mos IM  . Beta HCG, Quant  . CBC with Differential/Platelet  . POCT urinalysis dipstick  . POCT Microscopic Urinalysis ([facility])  . POCT urine pregnancy    Greater than 50% of the 25 minute visit was spent in counseling/coordination of care regardin first  trimester sxs - nml vs alarm, otc meds, foods, exercise, sex. No tob, etoh, etc. Always poss risk for miscarriage w/ spotting but likely just implantation bleeding since no d/c and has not recurred - nothing she can do to prevent a preg that might miscarries but do come in to be seen for Rh testing and follow hcg levels to ensure clears     [name], M.D.  Primary Care at [facility] [address] [place], [place] [address] [phone] phone [phone] fax  10/12/17 2:30 PM

## 2017-10-10 LAB — CBC WITH DIFFERENTIAL/PLATELET
Basophils Absolute: 0 10*3/uL (ref 0.0–0.2)
Basos: 0 %
EOS (ABSOLUTE): 0.1 10*3/uL (ref 0.0–0.4)
Eos: 1 %
Hematocrit: 36.9 % (ref 34.0–46.6)
Hemoglobin: 12 g/dL (ref 11.1–15.9)
Immature Grans (Abs): 0 10*3/uL (ref 0.0–0.1)
Immature Granulocytes: 0 %
Lymphocytes Absolute: 2.4 10*3/uL (ref 0.7–3.1)
Lymphs: 24 %
MCH: 26.4 pg — ABNORMAL LOW (ref 26.6–33.0)
MCHC: 32.5 g/dL (ref 31.5–35.7)
MCV: 81 fL (ref 79–97)
Monocytes Absolute: 0.6 10*3/uL (ref 0.1–0.9)
Monocytes: 6 %
Neutrophils Absolute: 6.9 10*3/uL (ref 1.4–7.0)
Neutrophils: 69 %
Platelets: 281 10*3/uL (ref 150–379)
RBC: 4.55 x10E6/uL (ref 3.77–5.28)
RDW: 13.5 % (ref 12.3–15.4)
WBC: 10 10*3/uL (ref 3.4–10.8)

## 2017-10-10 LAB — BETA HCG QUANT (REF LAB): hCG Quant: 4466 m[IU]/mL

## 2017-10-11 LAB — URINE CULTURE

## 2017-11-24 ENCOUNTER — Ambulatory Visit (INDEPENDENT_AMBULATORY_CARE_PROVIDER_SITE_OTHER): Payer: BLUE CROSS/BLUE SHIELD | Admitting: Obstetrics and Gynecology

## 2017-11-24 ENCOUNTER — Encounter: Payer: Self-pay | Admitting: Obstetrics and Gynecology

## 2017-11-24 DIAGNOSIS — Z3481 Encounter for supervision of other normal pregnancy, first trimester: Secondary | ICD-10-CM

## 2017-11-24 DIAGNOSIS — Z3400 Encounter for supervision of normal first pregnancy, unspecified trimester: Secondary | ICD-10-CM | POA: Insufficient documentation

## 2017-11-24 DIAGNOSIS — Z3480 Encounter for supervision of other normal pregnancy, unspecified trimester: Secondary | ICD-10-CM

## 2017-11-24 NOTE — Progress Notes (Signed)
Last pap 08/2017

## 2017-11-24 NOTE — Progress Notes (Signed)
  Subjective:    Deborah White is a G1P0 8665w1d being seen today for her first obstetrical visit.  Her obstetrical history is significant for first pregnancy. Patient does intend to breast feed. Pregnancy history fully reviewed.  Patient reports no complaints.  Vitals:   11/24/17 1410  BP: 118/77  Pulse: 83  Weight: 204 lb (92.5 kg)    HISTORY: OB History  Gravida Para Term Preterm AB Living  1            SAB TAB Ectopic Multiple Live Births               # Outcome Date GA Lbr Len/2nd Weight Sex Delivery Anes PTL Lv  1 Current              Past Medical History:  Diagnosis Date  . Anemia   . Thyroid disease    Past Surgical History:  Procedure Laterality Date  . BACK SURGERY  2010   cyst removal  . WRIST SURGERY Left 2011   Cyst   Family History  Problem Relation Age of Onset  . Breast cancer Mother   . Hypertension Mother   . Cancer Maternal Uncle        brain cancer   . Heart disease Maternal Grandmother      Exam    Uterus:     Pelvic Exam:    Perineum: No Hemorrhoids, Normal Perineum   Vulva: normal   Vagina:  normal mucosa, normal discharge   pH:    Cervix: nulliparous appearance   Adnexa: normal adnexa and no mass, fullness, tenderness   Bony Pelvis: gynecoid  System: Breast:  normal appearance, no masses or tenderness   Skin: normal coloration and turgor, no rashes    Neurologic: oriented, no focal deficits   Extremities: normal strength, tone, and muscle mass   HEENT extra ocular movement intact   Mouth/Teeth mucous membranes moist, pharynx normal without lesions and dental hygiene good   Neck supple and no masses   Cardiovascular: regular rate and rhythm   Respiratory:  chest clear, no wheezing, crepitations, rhonchi, normal symmetric air entry   Abdomen: soft, non-tender; bowel sounds normal; no masses,  no organomegaly   Urinary:       Assessment:    Pregnancy: G1P0 Patient Active Problem List   Diagnosis Date Noted  .  Encounter for supervision of other normal pregnancy, unspecified trimester 11/24/2017  . Family history of breast cancer in mother 01/31/2016  . Upper abdominal pain 03/09/2014  . Vitamin D deficiency 11/29/2013  . Anemia 11/29/2013  . Breast lump in female 11/29/2013        Plan:     Initial labs drawn. Prenatal vitamins. Problem list reviewed and updated. Genetic Screening discussed First Screen: ordered.  Ultrasound discussed; fetal survey: requested.  Follow up in 4 weeks. 50% of 30 min visit spent on counseling and coordination of care.     Deborah White 11/24/2017

## 2017-11-25 ENCOUNTER — Telehealth: Payer: Self-pay | Admitting: Pediatrics

## 2017-11-25 NOTE — Telephone Encounter (Signed)
Pt called nurse line. She states she needs a note to return to work without restriction.  I advised d/t pregnancy there are some restrictions. She states since she reported being dizzy to her management, they are requiring letter.  Please advise if ok to RTW and what restrictions?

## 2017-11-25 NOTE — Telephone Encounter (Signed)
Patient advised letter written and sent to MyChart acct. She voiced understanding and appreciation.

## 2017-11-26 LAB — CULTURE, OB URINE

## 2017-11-26 LAB — OBSTETRIC PANEL, INCLUDING HIV
Antibody Screen: NEGATIVE
Basophils Absolute: 0 10*3/uL (ref 0.0–0.2)
Basos: 0 %
EOS (ABSOLUTE): 0.2 10*3/uL (ref 0.0–0.4)
Eos: 2 %
HIV Screen 4th Generation wRfx: NONREACTIVE
Hematocrit: 36.4 % (ref 34.0–46.6)
Hemoglobin: 12.4 g/dL (ref 11.1–15.9)
Hepatitis B Surface Ag: NEGATIVE
Immature Grans (Abs): 0 10*3/uL (ref 0.0–0.1)
Immature Granulocytes: 0 %
Lymphocytes Absolute: 2.1 10*3/uL (ref 0.7–3.1)
Lymphs: 21 %
MCH: 27.9 pg (ref 26.6–33.0)
MCHC: 34.1 g/dL (ref 31.5–35.7)
MCV: 82 fL (ref 79–97)
Monocytes Absolute: 0.6 10*3/uL (ref 0.1–0.9)
Monocytes: 6 %
Neutrophils Absolute: 7.2 10*3/uL — ABNORMAL HIGH (ref 1.4–7.0)
Neutrophils: 71 %
Platelets: 221 10*3/uL (ref 150–379)
RBC: 4.44 x10E6/uL (ref 3.77–5.28)
RDW: 14.1 % (ref 12.3–15.4)
RPR Ser Ql: NONREACTIVE
Rh Factor: POSITIVE
Rubella Antibodies, IGG: 13.6 index (ref >0.99)
WBC: 10 10*3/uL (ref 3.4–10.8)

## 2017-11-26 LAB — HEMOGLOBINOPATHY EVALUATION
HGB C: 0 %
HGB S: 0 %
HGB VARIANT: 0 %
Hemoglobin A2 Quantitation: 2.6 % (ref 1.8–3.2)
Hemoglobin F Quantitation: 0 % (ref 0.0–2.0)
Hgb A: 97.4 % (ref 96.4–98.8)

## 2017-11-26 LAB — URINE CULTURE, OB REFLEX

## 2017-11-27 ENCOUNTER — Other Ambulatory Visit: Payer: Self-pay | Admitting: Obstetrics and Gynecology

## 2017-11-27 DIAGNOSIS — Z3480 Encounter for supervision of other normal pregnancy, unspecified trimester: Secondary | ICD-10-CM

## 2017-12-01 ENCOUNTER — Telehealth: Payer: Self-pay | Admitting: Pediatrics

## 2017-12-01 NOTE — Telephone Encounter (Signed)
Pt left message on voicemail stating the letter to return to work is not in [facility].  I called back to inform pt letter should be there, not sure why she can not see it on her end.  I would be glad to print and mail or she can pick up.  Unable to leave message, rang 6x without answer.

## 2017-12-02 ENCOUNTER — Encounter: Payer: Self-pay | Admitting: Family Medicine

## 2017-12-02 DIAGNOSIS — O99210 Obesity complicating pregnancy, unspecified trimester: Secondary | ICD-10-CM | POA: Insufficient documentation

## 2017-12-02 LAB — CYSTIC FIBROSIS MUTATION 97: Interpretation: NOT DETECTED

## 2017-12-03 LAB — SMN1 COPY NUMBER ANALYSIS (SMA CARRIER SCREENING)

## 2017-12-09 ENCOUNTER — Other Ambulatory Visit: Payer: Self-pay | Admitting: Obstetrics and Gynecology

## 2017-12-09 ENCOUNTER — Encounter (HOSPITAL_COMMUNITY): Payer: Self-pay

## 2017-12-09 ENCOUNTER — Ambulatory Visit (HOSPITAL_COMMUNITY)
Admission: RE | Admit: 2017-12-09 | Discharge: 2017-12-09 | Disposition: A | Discharge status: Home / Self Care | Payer: BLUE CROSS/BLUE SHIELD | Source: Ambulatory Visit | Attending: Obstetrics and Gynecology | Admitting: Obstetrics and Gynecology

## 2017-12-09 ENCOUNTER — Ambulatory Visit (HOSPITAL_COMMUNITY): Admission: RE | Admit: 2017-12-09 | Payer: BLUE CROSS/BLUE SHIELD | Source: Ambulatory Visit

## 2017-12-09 DIAGNOSIS — E669 Obesity, unspecified: Secondary | ICD-10-CM | POA: Diagnosis not present

## 2017-12-09 DIAGNOSIS — Z3400 Encounter for supervision of normal first pregnancy, unspecified trimester: Secondary | ICD-10-CM

## 2017-12-09 DIAGNOSIS — O99212 Obesity complicating pregnancy, second trimester: Secondary | ICD-10-CM | POA: Insufficient documentation

## 2017-12-09 DIAGNOSIS — O99210 Obesity complicating pregnancy, unspecified trimester: Secondary | ICD-10-CM

## 2017-12-09 DIAGNOSIS — Z3A14 14 weeks gestation of pregnancy: Secondary | ICD-10-CM | POA: Insufficient documentation

## 2017-12-09 DIAGNOSIS — Z3689 Encounter for other specified antenatal screening: Secondary | ICD-10-CM | POA: Diagnosis not present

## 2017-12-09 DIAGNOSIS — Z3480 Encounter for supervision of other normal pregnancy, unspecified trimester: Secondary | ICD-10-CM | POA: Diagnosis present

## 2017-12-10 ENCOUNTER — Encounter: Payer: Self-pay | Admitting: Family Medicine

## 2017-12-13 ENCOUNTER — Encounter (HOSPITAL_COMMUNITY): Payer: Self-pay

## 2017-12-20 ENCOUNTER — Encounter: Payer: Self-pay | Admitting: *Deleted

## 2017-12-20 ENCOUNTER — Encounter: Payer: Self-pay | Admitting: Obstetrics and Gynecology

## 2017-12-20 ENCOUNTER — Ambulatory Visit (INDEPENDENT_AMBULATORY_CARE_PROVIDER_SITE_OTHER): Payer: BLUE CROSS/BLUE SHIELD | Admitting: Obstetrics and Gynecology

## 2017-12-20 DIAGNOSIS — Z3402 Encounter for supervision of normal first pregnancy, second trimester: Secondary | ICD-10-CM

## 2017-12-20 DIAGNOSIS — Z3400 Encounter for supervision of normal first pregnancy, unspecified trimester: Secondary | ICD-10-CM

## 2017-12-20 NOTE — Progress Notes (Signed)
   PRENATAL VISIT NOTE  Subjective:  Deborah White is a 32 y.o. G1P0 at 2450w1d being seen today for ongoing prenatal care.  She is currently monitored for the following issues for this low-risk pregnancy and has Vitamin D deficiency; Anemia; Breast lump in female; Family history of breast cancer in mother; Supervision of normal first pregnancy, antepartum; and Obesity affecting pregnancy, antepartum on their problem list.  Patient reports no complaints.  Contractions: Not present. Vag. Bleeding: None.  Movement: Absent. Denies leaking of fluid.   The following portions of the patient's history were reviewed and updated as appropriate: allergies, current medications, past family history, past medical history, past social history, past surgical history and problem list. Problem list updated.  Objective:   Vitals:   12/20/17 1448  BP: 114/76  Pulse: (!) 101  Weight: 208 lb (94.3 kg)    Fetal Status: Fetal Heart Rate (bpm): 154   Movement: Absent     General:  Alert, oriented and cooperative. Patient is in no acute distress.  Skin: Skin is warm and dry. No rash noted.   Cardiovascular: Normal heart rate noted  Respiratory: Normal respiratory effort, no problems with respiration noted  Abdomen: Soft, gravid, appropriate for gestational age.  Pain/Pressure: Present     Pelvic: Cervical exam deferred        Extremities: Normal range of motion.  Edema: None  Mental Status:  Normal mood and affect. Normal behavior. Normal judgment and thought content.   Assessment and Plan:  Pregnancy: G1P0 at 8050w1d  1. Supervision of normal first pregnancy, antepartum Patient is doing well without complaints Discussed ultrasound results and EDC change Anatomy ultrasound ordered - AFP, Quad Screen - US MFM OB COMP + 14 WK; Future  Preterm labor symptoms and general obstetric precautions including but not limited to vaginal bleeding, contractions, leaking of fluid and fetal movement were reviewed in  detail with the patient. Please refer to After Visit Summary for other counseling recommendations.  Return in about 4 weeks (around 01/17/2018) for ROB.   Catalina AntiguaPeggy Shambhavi Salley, MD

## 2017-12-20 NOTE — Addendum Note (Signed)
Addended by: Tim LairLARK, Weaver Tweed on: 12/20/2017 03:28 PM   Modules accepted: Orders

## 2017-12-22 ENCOUNTER — Encounter: Payer: Self-pay | Admitting: Obstetrics & Gynecology

## 2017-12-23 LAB — AFP TETRA
DIA Mom Value: 0.86
DIA Value (EIA): 122 pg/mL
DSR (By Age)    1 IN: 553
DSR (Second Trimester) 1 IN: 2699
Gestational Age: 16.1 WEEKS
MSAFP Mom: 1.1
MSAFP: 29 ng/mL
MSHCG Mom: 1
MSHCG: 31367 m[IU]/mL
Maternal Age At EDD: 31.7 yr
Osb Risk: 8933
T18 (By Age): 1:2153 {titer}
Test Results:: NEGATIVE
Weight: 208 [lb_av]
uE3 Mom: 0.77
uE3 Value: 0.58 ng/mL

## 2018-01-11 ENCOUNTER — Other Ambulatory Visit: Payer: Self-pay | Admitting: Obstetrics and Gynecology

## 2018-01-11 ENCOUNTER — Ambulatory Visit (HOSPITAL_COMMUNITY)
Admission: RE | Admit: 2018-01-11 | Discharge: 2018-01-11 | Disposition: A | Discharge status: Home / Self Care | Payer: BLUE CROSS/BLUE SHIELD | Source: Ambulatory Visit | Attending: Obstetrics and Gynecology | Admitting: Obstetrics and Gynecology

## 2018-01-11 DIAGNOSIS — Z363 Encounter for antenatal screening for malformations: Secondary | ICD-10-CM | POA: Diagnosis not present

## 2018-01-11 DIAGNOSIS — Z3A19 19 weeks gestation of pregnancy: Secondary | ICD-10-CM

## 2018-01-11 DIAGNOSIS — Z3400 Encounter for supervision of normal first pregnancy, unspecified trimester: Secondary | ICD-10-CM

## 2018-01-11 DIAGNOSIS — O99212 Obesity complicating pregnancy, second trimester: Secondary | ICD-10-CM | POA: Diagnosis not present

## 2018-01-24 ENCOUNTER — Encounter: Payer: Self-pay | Admitting: Obstetrics and Gynecology

## 2018-01-31 ENCOUNTER — Encounter: Payer: Self-pay | Admitting: Obstetrics and Gynecology

## 2018-01-31 ENCOUNTER — Ambulatory Visit (INDEPENDENT_AMBULATORY_CARE_PROVIDER_SITE_OTHER): Payer: BLUE CROSS/BLUE SHIELD | Admitting: Obstetrics and Gynecology

## 2018-01-31 VITALS — BP 116/76 | HR 98 | Wt 216.0 lb

## 2018-01-31 DIAGNOSIS — O99210 Obesity complicating pregnancy, unspecified trimester: Secondary | ICD-10-CM

## 2018-01-31 DIAGNOSIS — Z3400 Encounter for supervision of normal first pregnancy, unspecified trimester: Secondary | ICD-10-CM

## 2018-01-31 MED ORDER — PRENATAL 27-1 MG PO TABS: 1.0000 | ORAL_TABLET | Freq: Every day | ORAL | 4 refills | Status: DC

## 2018-01-31 NOTE — Progress Notes (Signed)
   PRENATAL VISIT NOTE  Subjective:  Deborah White is a 32 y.o. G1P0 at 7847w1d being seen today for ongoing prenatal care.  She is currently monitored for the following issues for this low-risk pregnancy and has Vitamin D deficiency; Anemia; Breast lump in female; Family history of breast cancer in mother; Supervision of normal first pregnancy, antepartum; and Obesity affecting pregnancy, antepartum on their problem list.  Patient reports no complaints.  Contractions: Irritability. Vag. Bleeding: None.  Movement: Present. Denies leaking of fluid.   The following portions of the patient's history were reviewed and updated as appropriate: allergies, current medications, past family history, past medical history, past social history, past surgical history and problem list. Problem list updated.  Objective:   Vitals:   01/31/18 1610  BP: 116/76  Pulse: 98  Weight: 216 lb (98 kg)    Fetal Status: Fetal Heart Rate (bpm): 148 Fundal Height: 22 cm Movement: Present     General:  Alert, oriented and cooperative. Patient is in no acute distress.  Skin: Skin is warm and dry. No rash noted.   Cardiovascular: Normal heart rate noted  Respiratory: Normal respiratory effort, no problems with respiration noted  Abdomen: Soft, gravid, appropriate for gestational age.  Pain/Pressure: Present     Pelvic: Cervical exam deferred        Extremities: Normal range of motion.  Edema: Trace  Mental Status:  Normal mood and affect. Normal behavior. Normal judgment and thought content.   Assessment and Plan:  Pregnancy: G1P0 at 547w1d  1. Supervision of normal first pregnancy, antepartum Patient is doing well without complaints Refill on PNV provided Follow up anatomy ultrasound ordered Patient plans to take birthing classes Third trimester labs next visit - US MFM OB FOLLOW UP; Future  2. Obesity affecting pregnancy, antepartum Discussed excessive weight gain already. Patient admits to eating ice  cream daily and drinking juice and soft drinks Advised patient to decrease sugar intake and to focus on nutritious options   Preterm labor symptoms and general obstetric precautions including but not limited to vaginal bleeding, contractions, leaking of fluid and fetal movement were reviewed in detail with the patient. Please refer to After Visit Summary for other counseling recommendations.  Return in about 4 weeks (around 02/28/2018) for ROB, 2 hr glucola next visit.   Catalina AntiguaPeggy Dabney Dever, MD

## 2018-01-31 NOTE — Progress Notes (Signed)
Needs RF on PNV  Pt Concerned about growth of baby

## 2018-02-07 ENCOUNTER — Ambulatory Visit (HOSPITAL_COMMUNITY)
Admission: RE | Admit: 2018-02-07 | Discharge: 2018-02-07 | Disposition: A | Discharge status: Home / Self Care | Payer: BLUE CROSS/BLUE SHIELD | Source: Ambulatory Visit | Attending: Obstetrics and Gynecology | Admitting: Obstetrics and Gynecology

## 2018-02-07 ENCOUNTER — Other Ambulatory Visit: Payer: Self-pay | Admitting: Obstetrics and Gynecology

## 2018-02-07 DIAGNOSIS — Z362 Encounter for other antenatal screening follow-up: Secondary | ICD-10-CM

## 2018-02-07 DIAGNOSIS — Z3A23 23 weeks gestation of pregnancy: Secondary | ICD-10-CM | POA: Insufficient documentation

## 2018-02-07 DIAGNOSIS — O99212 Obesity complicating pregnancy, second trimester: Secondary | ICD-10-CM | POA: Diagnosis not present

## 2018-02-07 DIAGNOSIS — Z3400 Encounter for supervision of normal first pregnancy, unspecified trimester: Secondary | ICD-10-CM

## 2018-02-28 ENCOUNTER — Ambulatory Visit (INDEPENDENT_AMBULATORY_CARE_PROVIDER_SITE_OTHER): Payer: BLUE CROSS/BLUE SHIELD | Admitting: Obstetrics

## 2018-02-28 ENCOUNTER — Encounter: Payer: Self-pay | Admitting: Certified Nurse Midwife

## 2018-02-28 ENCOUNTER — Encounter: Payer: Self-pay | Admitting: Obstetrics

## 2018-02-28 ENCOUNTER — Other Ambulatory Visit: Payer: BLUE CROSS/BLUE SHIELD

## 2018-02-28 VITALS — BP 123/74 | HR 84 | Wt 214.9 lb

## 2018-02-28 DIAGNOSIS — Z23 Encounter for immunization: Secondary | ICD-10-CM

## 2018-02-28 DIAGNOSIS — Z3400 Encounter for supervision of normal first pregnancy, unspecified trimester: Secondary | ICD-10-CM

## 2018-02-28 DIAGNOSIS — Z3403 Encounter for supervision of normal first pregnancy, third trimester: Secondary | ICD-10-CM

## 2018-02-28 NOTE — Progress Notes (Signed)
Subjective:  Deborah White is a 32 y.o. G1P0 at 4753w1d being seen today for ongoing prenatal care.  She is currently monitored for the following issues for this low-risk pregnancy and has Vitamin D deficiency; Anemia; Breast lump in female; Family history of breast cancer in mother; Supervision of normal first pregnancy, antepartum; and Obesity affecting pregnancy, antepartum on their problem list.  Patient reports no complaints.  Contractions: Not present. Vag. Bleeding: None.  Movement: Present. Denies leaking of fluid.   The following portions of the patient's history were reviewed and updated as appropriate: allergies, current medications, past family history, past medical history, past social history, past surgical history and problem list. Problem list updated.  Objective:   Vitals:   02/28/18 0829  BP: 123/74  Pulse: 84  Weight: 214 lb 14.4 oz (97.5 kg)    Fetal Status: Fetal Heart Rate (bpm): 150   Movement: Present     General:  Alert, oriented and cooperative. Patient is in no acute distress.  Skin: Skin is warm and dry. No rash noted.   Cardiovascular: Normal heart rate noted  Respiratory: Normal respiratory effort, no problems with respiration noted  Abdomen: Soft, gravid, appropriate for gestational age. Pain/Pressure: Absent     Pelvic:  Cervical exam deferred        Extremities: Normal range of motion.  Edema: Trace  Mental Status: Normal mood and affect. Normal behavior. Normal judgment and thought content.   Urinalysis:      Assessment and Plan:  Pregnancy: G1P0 at 7453w1d  1. Supervision of normal first pregnancy, antepartum Rx: - Glucose Tolerance, 2 Hours w/1 Hour - CBC - HIV antibody - RPR - Tdap vaccine greater than or equal to 7yo IM  Preterm labor symptoms and general obstetric precautions including but not limited to vaginal bleeding, contractions, leaking of fluid and fetal movement were reviewed in detail with the patient. Please refer to After  Visit Summary for other counseling recommendations.  Return in about 2 weeks (around 03/14/2018) for ROB.   Brock BadHarper, Charles A, MD

## 2018-03-01 LAB — GLUCOSE TOLERANCE, 2 HOURS W/ 1HR
Glucose, 1 hour: 93 mg/dL (ref 65–179)
Glucose, 2 hour: 101 mg/dL (ref 65–152)
Glucose, Fasting: 83 mg/dL (ref 65–91)

## 2018-03-01 LAB — CBC
Hematocrit: 37 % (ref 34.0–46.6)
Hemoglobin: 12.2 g/dL (ref 11.1–15.9)
MCH: 28.3 pg (ref 26.6–33.0)
MCHC: 33 g/dL (ref 31.5–35.7)
MCV: 86 fL (ref 79–97)
Platelets: 225 10*3/uL (ref 150–379)
RBC: 4.31 x10E6/uL (ref 3.77–5.28)
RDW: 13.7 % (ref 12.3–15.4)
WBC: 9.8 10*3/uL (ref 3.4–10.8)

## 2018-03-01 LAB — RPR: RPR Ser Ql: NONREACTIVE

## 2018-03-01 LAB — HIV ANTIBODY (ROUTINE TESTING W REFLEX): HIV Screen 4th Generation wRfx: NONREACTIVE

## 2018-03-14 ENCOUNTER — Ambulatory Visit (INDEPENDENT_AMBULATORY_CARE_PROVIDER_SITE_OTHER): Payer: BLUE CROSS/BLUE SHIELD | Admitting: Obstetrics and Gynecology

## 2018-03-14 VITALS — BP 102/71 | HR 98 | Wt 219.0 lb

## 2018-03-14 DIAGNOSIS — Z3400 Encounter for supervision of normal first pregnancy, unspecified trimester: Secondary | ICD-10-CM

## 2018-03-14 NOTE — Progress Notes (Signed)
   PRENATAL VISIT NOTE  Subjective:  Deborah White is a 32 y.o. G1P0 at 7532w1d being seen today for ongoing prenatal care.  She is currently monitored for the following issues for this low-risk pregnancy and has Vitamin D deficiency; Anemia; Breast lump in female; Family history of breast cancer in mother; Supervision of normal first pregnancy, antepartum; and Obesity affecting pregnancy, antepartum on their problem list.  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  Contractions: Not present. Vag. Bleeding: None.  Movement: Present. Denies leaking of fluid.   The following portions of the patient's history were reviewed and updated as appropriate: allergies, current medications, past family history, past medical history, past social history, past surgical history and problem list. Problem list updated.  Objective:   Vitals:   03/14/18 1614  BP: 102/71  Pulse: 98  Weight: 219 lb (99.3 kg)    Fetal Status: Fetal Heart Rate (bpm): 154 Fundal Height: 28 cm Movement: Present     General:  Alert, oriented and cooperative. Patient is in no acute distress.  Skin: Skin is warm and dry. No rash noted.   Cardiovascular: Normal heart rate noted  Respiratory: Normal respiratory effort, no problems with respiration noted  Abdomen: Soft, gravid, appropriate for gestational age.  Pain/Pressure: Present     Pelvic: Cervical exam deferred        Extremities: Normal range of motion.  Edema: None  Mental Status: Normal mood and affect. Normal behavior. Normal judgment and thought content.   Assessment and Plan:  Pregnancy: G1P0 at 5732w1d  1. Supervision of normal first pregnancy, antepartum Patient is doing well Advised the use of compression stockings as patient is on her feet all day. Advised to elevate feet when she can at work Patient is not interested in  antidepressant but welcomed a meeting with Deborah White (intergrated behavioral health specialist) Follow up ultrasound ordered Normal glucola - US MFM OB FOLLOW UP; Future - Amb ref to Integrated Behavioral [facility]  Preterm labor symptoms and general obstetric precautions including but not limited to vaginal bleeding, contractions, leaking of fluid and fetal movement were reviewed in detail with the patient. Please refer to After Visit Summary for other counseling recommendations.  Return in about 2 weeks (around 03/28/2018) for ROB.  Future Appointments  Date Time Provider Department Center  03/29/2018  3:45 PM Deborah White, Gigi GinPeggy, MD [facility] None    Catalina AntiguaPeggy Didi Ganaway, MD

## 2018-03-14 NOTE — Progress Notes (Signed)
Pt c/o leg cramps and depression and not eating .

## 2018-03-14 NOTE — Patient Instructions (Signed)

## 2018-03-15 ENCOUNTER — Ambulatory Visit: Payer: BLUE CROSS/BLUE SHIELD | Admitting: Physician Assistant

## 2018-03-15 ENCOUNTER — Encounter: Payer: Self-pay | Admitting: Physician Assistant

## 2018-03-15 ENCOUNTER — Encounter: Payer: Self-pay | Admitting: Certified Nurse Midwife

## 2018-03-15 ENCOUNTER — Other Ambulatory Visit: Payer: Self-pay

## 2018-03-15 VITALS — BP 100/52 | HR 94 | Temp 97.7°F | Resp 16 | Ht 64.75 in | Wt 212.4 lb

## 2018-03-15 DIAGNOSIS — J3489 Other specified disorders of nose and nasal sinuses: Secondary | ICD-10-CM

## 2018-03-15 DIAGNOSIS — J069 Acute upper respiratory infection, unspecified: Secondary | ICD-10-CM

## 2018-03-15 DIAGNOSIS — R52 Pain, unspecified: Secondary | ICD-10-CM | POA: Diagnosis not present

## 2018-03-15 DIAGNOSIS — J029 Acute pharyngitis, unspecified: Secondary | ICD-10-CM | POA: Diagnosis not present

## 2018-03-15 LAB — POCT INFLUENZA A/B
Influenza A, POC: NEGATIVE
Influenza B, POC: NEGATIVE

## 2018-03-15 LAB — POCT RAPID STREP A (OFFICE): Rapid Strep A Screen: NEGATIVE

## 2018-03-15 NOTE — Progress Notes (Signed)
MRN: [id number] DOB: 1985-12-13  Subjective:   Deborah White is a 32 y.o. female presenting for chief complaint of Sore Throat (x 2 days) .  Reports 2 day history of rhinorrhea, sore throat and body aches. Has some ear fullness. Started having dry cough this morning. Has tried tea and resting for relief. Denies fever, sinus pain, ear pain, wheezing, shortness of breath and chest pain, chills, nausea, vomiting, abdominal pain and diarrhea. Has had sick contact with husband. No history of seasonal allergies, no history of asthma. Patient has had flu shot this season. Denies smoking. Of note, pt is 7 months pregnant. Denies any other aggravating or relieving factors, no other questions or concerns.  Aaradhya has a current medication list which includes the following prescription(s): vitamin d, prenatal, ferrous sulfate, and valacyclovir. Also has No Known Allergies.  Leasia  has a past medical history of Anemia and Thyroid disease. Also  has a past surgical history that includes Wrist surgery (Left, 2011) and Back surgery (2010).   Objective:   Vitals: BP (!) 100/52   Pulse 94   Temp 97.7 F (36.5 C)   Resp 16   Ht 5' 4.75" (1.645 m)   Wt 212 lb 6.4 oz (96.3 kg)   LMP 09/07/2017   SpO2 98%   BMI 35.62 kg/m   Physical Exam  Constitutional: She is oriented to person, place, and time. She appears well-developed and well-nourished. No distress.  HENT:  Head: Normocephalic and atraumatic.  Right Ear: Tympanic membrane, external ear and ear canal normal.  Left Ear: Tympanic membrane, external ear and ear canal normal.  Nose: Mucosal edema (mild bilaterally) present. Right sinus exhibits no maxillary sinus tenderness and no frontal sinus tenderness. Left sinus exhibits no maxillary sinus tenderness and no frontal sinus tenderness.  Mouth/Throat: Uvula is midline and mucous membranes are normal. Posterior oropharyngeal erythema present. No posterior oropharyngeal edema or tonsillar  abscesses. Tonsils are 1+ on the right. Tonsils are 1+ on the left. No tonsillar exudate.  Eyes: Conjunctivae are normal.  Neck: Normal range of motion.  Cardiovascular: Normal rate, regular rhythm, normal heart sounds and intact distal pulses.  Pulmonary/Chest: Effort normal and breath sounds normal. She has no decreased breath sounds. She has no wheezes. She has no rhonchi. She has no rales.  Lymphadenopathy:       Head (right side): No submental, no submandibular, no tonsillar, no preauricular, no posterior auricular and no occipital adenopathy present.       Head (left side): No submental, no submandibular, no tonsillar, no preauricular, no posterior auricular and no occipital adenopathy present.    She has no cervical adenopathy.       Right: No supraclavicular adenopathy present.       Left: No supraclavicular adenopathy present.  Neurological: She is alert and oriented to person, place, and time.  Skin: Skin is warm and dry.  Psychiatric: She has a normal mood and affect.  Vitals reviewed.   Results for orders placed or performed in visit on 03/15/18 (from the past 24 hour(s))  POCT rapid strep A     Status: Normal   Collection Time: 03/15/18  1:54 PM  Result Value Ref Range   Rapid Strep A Screen Negative Negative  POCT Influenza A/B     Status: None   Collection Time: 03/15/18  1:55 PM  Result Value Ref Range   Influenza A, POC Negative Negative   Influenza B, POC Negative Negative    Assessment and  Plan :  1. Acute upper respiratory infection - Likely viral in etiology d/t reassuring physical exam findings and labs. - Advised supportive care. - Return to clinic if symptoms worsen or fail to improve in 5-7 days, otherwise return to clinic as needed.  2. Sore throat - POCT rapid strep A - Culture, Group A Strep  3. Rhinorrhea 4. Body aches - POCT Influenza A/B  Benjiman CoreBrittany Wiseman, PA-C  [facility] 03/15/2018 2:28 PM

## 2018-03-15 NOTE — Patient Instructions (Addendum)
How to Treat a Cold or Cough During Pregnancy If you get a cold or a cough, try treating it by doing the following: Get ample rest - Take naps, sleep through the night, and sit down to relax. These are great ways to give your body much needed down time.  Drink plenty of fluids - Drink water, juice, or broth to add necessary fluids back into your body.  Eat well - Even if you cannot stomach larger meals, try eating small portions often. For your own comfort, it is important that you treat the symptoms associated with your cold or a cough. Natural remedies to some of your most bothersome symptoms include: Reduce congestion - Place a humidifier in your room, keep your head elevated on your pillow while resting, or use nasal strips.  Alleviate your sore throat - Suck on ice chips, drink warm tea, or gargle with warm salt water. It is best to reduce the number of over-the-counter medications you take. Many medications you normally would use to treat the symptoms of your cold are not safe to take during your pregnancy. The following is a list of medications that pose little risk to your baby during pregnancy; however, it is best to consult with your doctor before taking any medications to relieve your symptoms. Acetaminophen (i.e. Tylenol) can be used to alleviate fevers, headaches, and body aches.  Anesthetic sore throat lozenges can ease the pain in your throat.    Upper Respiratory Infection, Adult Most upper respiratory infections (URIs) are caused by a virus. A URI affects the nose, throat, and upper air passages. The most common type of URI is often called "the common cold." Follow these instructions at home:  Take medicines only as told by your doctor.  Gargle warm saltwater or take cough drops to comfort your throat as told by your doctor.  Use a warm mist humidifier or inhale steam from a shower to increase air moisture. This may make it easier to breathe.  Drink enough fluid to keep  your pee (urine) clear or pale yellow.  Eat soups and other clear broths.  Have a healthy diet.  Rest as needed.  Go back to work when your fever is gone or your doctor says it is okay. ? You may need to stay home longer to avoid giving your URI to others. ? You can also wear a face mask and wash your hands often to prevent spread of the virus.  Use your inhaler more if you have asthma.  Do not use any tobacco products, including cigarettes, chewing tobacco, or electronic cigarettes. If you need help quitting, ask your doctor. Contact a doctor if:  You are getting worse, not better.  Your symptoms are not helped by medicine.  You have chills.  You are getting more short of breath.  You have brown or red mucus.  You have yellow or brown discharge from your nose.  You have pain in your face, especially when you bend forward.  You have a fever.  You have puffy (swollen) neck glands.  You have pain while swallowing.  You have white areas in the back of your throat. Get help right away if:  You have very bad or constant: ? Headache. ? Ear pain. ? Pain in your forehead, behind your eyes, and over your cheekbones (sinus pain). ? Chest pain.  You have long-lasting (chronic) lung disease and any of the following: ? Wheezing. ? Long-lasting cough. ? Coughing up blood. ? A change in  your usual mucus.  You have a stiff neck.  You have changes in your: ? Vision. ? Hearing. ? Thinking. ? Mood. This information is not intended to replace advice given to you by your health care provider. Make sure you discuss any questions you have with your health care provider. Document Released: 05/11/2008 Document Revised: 07/26/2016 Document Reviewed: 02/28/2014 Elsevier Interactive Patient Education  2018 [employer].   IF you received an x-ray today, you will receive an invoice from [facility]. Please contact [facility] at [phone] with questions or  concerns regarding your invoice.   IF you received labwork today, you will receive an invoice from [facility]. Please contact [facility] at [phone] with questions or concerns regarding your invoice.   Our billing staff will not be able to assist you with questions regarding bills from these companies.  You will be contacted with the lab results as soon as they are available. The fastest way to get your results is to activate your My Chart account. Instructions are located on the last page of this paperwork. If you have not heard from [place] regarding the results in 2 weeks, please contact this office.

## 2018-03-17 LAB — CULTURE, GROUP A STREP: Strep A Culture: NEGATIVE

## 2018-03-25 ENCOUNTER — Other Ambulatory Visit: Payer: Self-pay | Admitting: Obstetrics and Gynecology

## 2018-03-25 ENCOUNTER — Ambulatory Visit (HOSPITAL_COMMUNITY)
Admission: RE | Admit: 2018-03-25 | Discharge: 2018-03-25 | Disposition: A | Discharge status: Home / Self Care | Payer: BLUE CROSS/BLUE SHIELD | Source: Ambulatory Visit | Attending: Obstetrics and Gynecology | Admitting: Obstetrics and Gynecology

## 2018-03-25 DIAGNOSIS — O359XX0 Maternal care for (suspected) fetal abnormality and damage, unspecified, not applicable or unspecified: Secondary | ICD-10-CM

## 2018-03-25 DIAGNOSIS — Z362 Encounter for other antenatal screening follow-up: Secondary | ICD-10-CM

## 2018-03-25 DIAGNOSIS — O99213 Obesity complicating pregnancy, third trimester: Secondary | ICD-10-CM | POA: Insufficient documentation

## 2018-03-25 DIAGNOSIS — O358XX0 Maternal care for other (suspected) fetal abnormality and damage, not applicable or unspecified: Secondary | ICD-10-CM | POA: Diagnosis not present

## 2018-03-25 DIAGNOSIS — Z3A29 29 weeks gestation of pregnancy: Secondary | ICD-10-CM

## 2018-03-25 DIAGNOSIS — Z3400 Encounter for supervision of normal first pregnancy, unspecified trimester: Secondary | ICD-10-CM

## 2018-03-29 ENCOUNTER — Encounter: Payer: Self-pay | Admitting: Obstetrics and Gynecology

## 2018-03-29 ENCOUNTER — Ambulatory Visit (INDEPENDENT_AMBULATORY_CARE_PROVIDER_SITE_OTHER): Payer: BLUE CROSS/BLUE SHIELD | Admitting: Obstetrics and Gynecology

## 2018-03-29 VITALS — BP 112/74 | HR 91 | Wt 223.0 lb

## 2018-03-29 DIAGNOSIS — O99210 Obesity complicating pregnancy, unspecified trimester: Secondary | ICD-10-CM

## 2018-03-29 DIAGNOSIS — Z3400 Encounter for supervision of normal first pregnancy, unspecified trimester: Secondary | ICD-10-CM

## 2018-03-29 NOTE — Progress Notes (Signed)
   PRENATAL VISIT NOTE  Subjective:  Deborah White is a 32 y.o. G1P0 at 437w2d being seen today for ongoing prenatal care.  She is currently monitored for the following issues for this low-risk pregnancy and has Vitamin D deficiency; Anemia; Breast lump in female; Family history of breast cancer in mother; Supervision of normal first pregnancy, antepartum; and Obesity affecting pregnancy, antepartum on their problem list.  Patient reports no complaints.  Contractions: Not present. Vag. Bleeding: None.  Movement: Present. Denies leaking of fluid.   The following portions of the patient's history were reviewed and updated as appropriate: allergies, current medications, past family history, past medical history, past social history, past surgical history and problem list. Problem list updated.  Objective:   Vitals:   03/29/18 1537  BP: 112/74  Pulse: 91  Weight: 223 lb (101.2 kg)    Fetal Status: Fetal Heart Rate (bpm): 160 Fundal Height: 30 cm Movement: Present     General:  Alert, oriented and cooperative. Patient is in no acute distress.  Skin: Skin is warm and dry. No rash noted.   Cardiovascular: Normal heart rate noted  Respiratory: Normal respiratory effort, no problems with respiration noted  Abdomen: Soft, gravid, appropriate for gestational age.  Pain/Pressure: Absent     Pelvic: Cervical exam deferred        Extremities: Normal range of motion.  Edema: Trace  Mental Status: Normal mood and affect. Normal behavior. Normal judgment and thought content.   Assessment and Plan:  Pregnancy: G1P0 at 8737w2d  1. Supervision of normal first pregnancy, antepartum Patient is doing well without complaints Reviewed recent ultrasound results with resolution of pyelectasis Patient scheduled to meet behavioral health specialist next week  2. Obesity affecting pregnancy, antepartum   Preterm labor symptoms and general obstetric precautions including but not limited to vaginal  bleeding, contractions, leaking of fluid and fetal movement were reviewed in detail with the patient. Please refer to After Visit Summary for other counseling recommendations.  Return in about 2 weeks (around 04/12/2018) for ROB.  Future Appointments  Date Time Provider Department Center  04/05/2018  3:30 PM [facility]    Catalina AntiguaPeggy Campbell Kray, MD

## 2018-04-01 ENCOUNTER — Institutional Professional Consult (permissible substitution): Payer: Self-pay

## 2018-04-05 ENCOUNTER — Institutional Professional Consult (permissible substitution): Payer: Self-pay

## 2018-04-05 NOTE — BH Specialist Note (Deleted)
Integrated Behavioral Health Initial Visit  MRN: [id number] Name: Deborah White  Number of Integrated Behavioral Health Clinician visits:: 1/6 Session Start time: ***  Session End time: *** Total time: {IBH Total Time:21014050}  Type of Service: Integrated Behavioral Health- Individual/Family Interpretor:Yes.   Interpretor Name and Language: Arabic, ***   Warm Hand Off Completed.       SUBJECTIVE: Deborah White is a 32 y.o. female accompanied by {CHL AMB ACCOMPANIED NW:2956213086} Patient was referred by Dr Jolayne Panther for depression. Patient reports the following symptoms/concerns: *** Duration of problem: ***; Severity of problem: {Mild/Moderate/Severe:20260}  OBJECTIVE: Mood: {BHH MOOD:22306} and Affect: {BHH AFFECT:22307} Risk of harm to self or others: No plan to harm self or others  LIFE CONTEXT: Family and Social: *** School/Work: *** Self-Care: *** Life Changes: Current pregnancy ***  GOALS ADDRESSED: Patient will: 1. Reduce symptoms of: {IBH Symptoms:21014056} 2. Increase knowledge and/or ability of: {IBH Patient Tools:21014057}  3. Demonstrate ability to: {IBH Goals:21014053}  INTERVENTIONS: Interventions utilized: {IBH Interventions:21014054}  Standardized Assessments completed: {IBH Screening Tools:21014051}  ASSESSMENT: Patient currently experiencing ***.   Patient may benefit from ***.  PLAN: 1. Follow up with behavioral health clinician on : *** 2. Behavioral recommendations: *** 3. Referral(s): {IBH Referrals:21014055} 4. "From scale of 1-10, how likely are you to follow plan?": ***  Rae Lips, LCSW  Depression screen [facility] 2/9 03/15/2018 10/09/2017 08/28/2017 08/14/2017 04/05/2017  Decreased Interest 0 0 0 0 0  Down, Depressed, Hopeless 0 0 0 0 0  PHQ - 2 Score 0 0 0 0 0  Altered sleeping - - - - -  Tired, decreased energy - - - - -  Change in appetite - - - - -  Feeling bad or failure about yourself  - - - - -  Trouble concentrating  - - - - -  Moving slowly or fidgety/restless - - - - -  Suicidal thoughts - - - - -  PHQ-9 Score - - - - -  Difficult doing work/chores - - - - -   No flowsheet data found.

## 2018-04-11 ENCOUNTER — Encounter: Payer: Self-pay | Admitting: *Deleted

## 2018-04-11 ENCOUNTER — Encounter: Payer: Self-pay | Admitting: Obstetrics and Gynecology

## 2018-04-11 ENCOUNTER — Ambulatory Visit (INDEPENDENT_AMBULATORY_CARE_PROVIDER_SITE_OTHER): Payer: BLUE CROSS/BLUE SHIELD | Admitting: Obstetrics and Gynecology

## 2018-04-11 VITALS — BP 123/76 | HR 92 | Temp 98.2°F | Wt 223.4 lb

## 2018-04-11 DIAGNOSIS — Z3400 Encounter for supervision of normal first pregnancy, unspecified trimester: Secondary | ICD-10-CM

## 2018-04-11 DIAGNOSIS — O99210 Obesity complicating pregnancy, unspecified trimester: Secondary | ICD-10-CM

## 2018-04-11 NOTE — Progress Notes (Signed)
   PRENATAL VISIT NOTE  Subjective:  Deborah White is a 32 y.o. G1P0 at [redacted]w[redacted]d being seen today for ongoing prenatal care.  She is currently monitored for the following issues for this low-risk pregnancy and has Vitamin D deficiency; Anemia; Breast lump in female; Family history of breast cancer in mother; Supervision of normal first pregnancy, antepartum; and Obesity affecting pregnancy, antepartum on their problem list.  Patient reports no complaints.  Contractions: Not present. Vag. Bleeding: None.  Movement: Present. Denies leaking of fluid.   The following portions of the patient's history were reviewed and updated as appropriate: allergies, current medications, past family history, past medical history, past social history, past surgical history and problem list. Problem list updated.  Objective:   Vitals:   04/11/18 1618  BP: 123/76  Pulse: 92  Temp: 98.2 F (36.8 C)  Weight: 223 lb 6.4 oz (101.3 kg)    Fetal Status: Fetal Heart Rate (bpm): 160 Fundal Height: 32 cm Movement: Present     General:  Alert, oriented and cooperative. Patient is in no acute distress.  Skin: Skin is warm and dry. No rash noted.   Cardiovascular: Normal heart rate noted  Respiratory: Normal respiratory effort, no problems with respiration noted  Abdomen: Soft, gravid, appropriate for gestational age.  Pain/Pressure: Absent     Pelvic: Cervical exam deferred        Extremities: Normal range of motion.  Edema: Moderate pitting, indentation subsides rapidly  Mental Status: Normal mood and affect. Normal behavior. Normal judgment and thought content.   Assessment and Plan:  Pregnancy: G1P0 at [redacted]w[redacted]d  1. Supervision of normal first pregnancy, antepartum Patient is doing well without complaints Patient failed to keep appointment with behavioral health but plans to reschedule today  2. Obesity affecting pregnancy, antepartum   Preterm labor symptoms and general obstetric precautions including but  not limited to vaginal bleeding, contractions, leaking of fluid and fetal movement were reviewed in detail with the patient. Please refer to After Visit Summary for other counseling recommendations.  Return in about 2 weeks (around 04/25/2018) for ROB.  No future appointments.  Catalina Antigua, MD

## 2018-04-13 ENCOUNTER — Institutional Professional Consult (permissible substitution): Payer: Self-pay

## 2018-04-13 NOTE — BH Specialist Note (Deleted)
Integrated Behavioral Health Initial Visit  MRN: [id number] Name: Deborah White  Number of Integrated Behavioral Health Clinician visits:: 1/6 Session Start time: ***  Session End time: *** Total time: {IBH Total Time:21014050}  Type of Service: Integrated Behavioral Health- Individual/Family Interpretor:Yes.   Interpretor Name and Language: Arabic, ***   Warm Hand Off Completed.       SUBJECTIVE: Deborah White is a 32 y.o. female accompanied by {CHL AMB ACCOMPANIED WU:9811914782} Patient was referred by Dr Jolayne Panther for depression. Patient reports the following symptoms/concerns: *** Duration of problem: ***; Severity of problem: {Mild/Moderate/Severe:20260}  OBJECTIVE: Mood: {BHH MOOD:22306} and Affect: {BHH AFFECT:22307} Risk of harm to self or others: {CHL AMB BH Suicide Current Mental Status:21022748}  LIFE CONTEXT: Family and Social: *** School/Work: *** Self-Care: *** Life Changes: Current pregnancy ***  GOALS ADDRESSED: Patient will: 1. Reduce symptoms of: {IBH Symptoms:21014056} 2. Increase knowledge and/or ability of: {IBH Patient Tools:21014057}  3. Demonstrate ability to: {IBH Goals:21014053}  INTERVENTIONS: Interventions utilized: {IBH Interventions:21014054}  Standardized Assessments completed: {IBH Screening Tools:21014051}  ASSESSMENT: Patient currently experiencing ***.   Patient may benefit from psychoeducation and brief therapeutic interventions regarding coping with symptoms of ***   PLAN: 1. Follow up with behavioral health clinician on : *** 2. Behavioral recommendations: *** 3. Referral(s): {IBH Referrals:21014055} 4. "From scale of 1-10, how likely are you to follow plan?": ***  Rae Lips, LCSW  Depression screen [facility] 2/9 03/15/2018 10/09/2017 08/28/2017 08/14/2017 04/05/2017  Decreased Interest 0 0 0 0 0  Down, Depressed, Hopeless 0 0 0 0 0  PHQ - 2 Score 0 0 0 0 0  Altered sleeping - - - - -  Tired, decreased energy - - - -  -  Change in appetite - - - - -  Feeling bad or failure about yourself  - - - - -  Trouble concentrating - - - - -  Moving slowly or fidgety/restless - - - - -  Suicidal thoughts - - - - -  PHQ-9 Score - - - - -  Difficult doing work/chores - - - - -   No flowsheet data found.

## 2018-05-03 ENCOUNTER — Ambulatory Visit (INDEPENDENT_AMBULATORY_CARE_PROVIDER_SITE_OTHER): Payer: BLUE CROSS/BLUE SHIELD | Admitting: Obstetrics and Gynecology

## 2018-05-03 ENCOUNTER — Encounter: Payer: Self-pay | Admitting: Obstetrics and Gynecology

## 2018-05-03 ENCOUNTER — Encounter: Payer: Self-pay | Admitting: Obstetrics

## 2018-05-03 VITALS — BP 111/74 | HR 94 | Wt 224.0 lb

## 2018-05-03 DIAGNOSIS — O99210 Obesity complicating pregnancy, unspecified trimester: Secondary | ICD-10-CM

## 2018-05-03 DIAGNOSIS — Z3400 Encounter for supervision of normal first pregnancy, unspecified trimester: Secondary | ICD-10-CM

## 2018-05-03 NOTE — Progress Notes (Signed)
   PRENATAL VISIT NOTE  Subjective:  Deborah White is a 32 y.o. G1P0 at [redacted]w[redacted]d being seen today for ongoing prenatal care.  She is currently monitored for the following issues for this low-risk pregnancy and has Vitamin D deficiency; Anemia; Breast lump in female; Family history of breast cancer in mother; Supervision of normal first pregnancy, antepartum; and Obesity affecting pregnancy, antepartum on their problem list.  Patient reports right leg pain for the past 2 weeks, pain originates from the posterior hip and travels down thigh .  Contractions: Irritability. Vag. Bleeding: None.  Movement: Present. Denies leaking of fluid.   The following portions of the patient's history were reviewed and updated as appropriate: allergies, current medications, past family history, past medical history, past social history, past surgical history and problem list. Problem list updated.  Objective:   Vitals:   05/03/18 1539  BP: 111/74  Pulse: 94  Weight: 224 lb (101.6 kg)    Fetal Status: Fetal Heart Rate (bpm): 150 Fundal Height: 35 cm Movement: Present     General:  Alert, oriented and cooperative. Patient is in no acute distress.  Skin: Skin is warm and dry. No rash noted.   Cardiovascular: Normal heart rate noted  Respiratory: Normal respiratory effort, no problems with respiration noted  Abdomen: Soft, gravid, appropriate for gestational age.  Pain/Pressure: Present     Pelvic: Cervical exam deferred        Extremities: Normal range of motion.  Edema: Moderate pitting, indentation subsides rapidly, equal in size bilaterally, no palpable cord  Mental Status: Normal mood and affect. Normal behavior. Normal judgment and thought content.   Assessment and Plan:  Pregnancy: G1P0 at [redacted]w[redacted]d  1. Supervision of normal first pregnancy, antepartum Patient is doing well with sciatic nerve pain Encouraged stretching exercises and staying active Cultures next visit Patient never rescheduled  appointment with behavioral health therapist  2. Obesity affecting pregnancy, antepartum   Preterm labor symptoms and general obstetric precautions including but not limited to vaginal bleeding, contractions, leaking of fluid and fetal movement were reviewed in detail with the patient. Please refer to After Visit Summary for other counseling recommendations.  Return in about 1 week (around 05/10/2018) for ROB.  Future Appointments  Date Time Provider Department Center  05/03/2018  4:15 PM Azia Toutant, Gigi Gin, MD [facility] None  05/10/2018  3:45 PM Conan Bowens, MD [facility] None    Catalina Antigua, MD

## 2018-05-03 NOTE — Progress Notes (Signed)
C/o swollen legs and pain in legs 4-5/10 x 2 weeks.

## 2018-05-10 ENCOUNTER — Ambulatory Visit (INDEPENDENT_AMBULATORY_CARE_PROVIDER_SITE_OTHER): Payer: BLUE CROSS/BLUE SHIELD | Admitting: Obstetrics and Gynecology

## 2018-05-10 ENCOUNTER — Encounter: Payer: Self-pay | Admitting: *Deleted

## 2018-05-10 ENCOUNTER — Encounter: Payer: Self-pay | Admitting: Obstetrics and Gynecology

## 2018-05-10 VITALS — BP 117/73 | HR 94 | Wt 226.8 lb

## 2018-05-10 DIAGNOSIS — Z113 Encounter for screening for infections with a predominantly sexual mode of transmission: Secondary | ICD-10-CM

## 2018-05-10 DIAGNOSIS — Z3400 Encounter for supervision of normal first pregnancy, unspecified trimester: Secondary | ICD-10-CM

## 2018-05-10 NOTE — Progress Notes (Signed)
   PRENATAL VISIT NOTE  Subjective:  Deborah White is a 32 y.o. G1P0 at [redacted]w[redacted]d being seen today for ongoing prenatal care.  She is currently monitored for the following issues for this low-risk pregnancy and has Vitamin D deficiency; Anemia; Breast lump in female; Family history of breast cancer in mother; Supervision of normal first pregnancy, antepartum; and Obesity affecting pregnancy, antepartum on their problem list.  Patient reports occasional contractions.  Contractions: Irregular. Vag. Bleeding: None.  Movement: Present. Denies leaking of fluid.   The following portions of the patient's history were reviewed and updated as appropriate: allergies, current medications, past family history, past medical history, past social history, past surgical history and problem list. Problem list updated.  Objective:   Vitals:   05/10/18 1542  BP: 117/73  Pulse: 94  Weight: 226 lb 12.8 oz (102.9 kg)    Fetal Status: Fetal Heart Rate (bpm): 154   Movement: Present     General:  Alert, oriented and cooperative. Patient is in no acute distress.  Skin: Skin is warm and dry. No rash noted.   Cardiovascular: Normal heart rate noted  Respiratory: Normal respiratory effort, no problems with respiration noted  Abdomen: Soft, gravid, appropriate for gestational age.  Pain/Pressure: Present     Pelvic: Cervical exam deferred        Extremities: Normal range of motion.  Edema: Moderate pitting, indentation subsides rapidly  Mental Status: Normal mood and affect. Normal behavior. Normal judgment and thought content.   Assessment and Plan:  Pregnancy: G1P0 at [redacted]w[redacted]d   1. Supervision of normal first pregnancy, antepartum - Strep Gp B NAA - Cervicovaginal ancillary only [facility], rec she obtain records and keep them with her in the event that she opts to deliver at outside facility. She also asked about travel, reviewed that I do  not recommend extensive travel at this point in pregnancy.  Preterm labor symptoms and general obstetric precautions including but not limited to vaginal bleeding, contractions, leaking of fluid and fetal movement were reviewed in detail with the patient. Please refer to After Visit Summary for other counseling recommendations.  Return in about 1 week (around 05/17/2018) for OB visit.  No future appointments.  Conan BowensKelly M Chianne Byrns, MD

## 2018-05-11 LAB — CERVICOVAGINAL ANCILLARY ONLY
Chlamydia: NEGATIVE
Neisseria Gonorrhea: NEGATIVE

## 2018-05-12 LAB — STREP GP B NAA: Strep Gp B NAA: NEGATIVE

## 2018-05-19 ENCOUNTER — Encounter: Payer: Self-pay | Admitting: *Deleted

## 2018-05-19 ENCOUNTER — Ambulatory Visit (INDEPENDENT_AMBULATORY_CARE_PROVIDER_SITE_OTHER): Payer: Self-pay | Admitting: Obstetrics and Gynecology

## 2018-05-19 ENCOUNTER — Encounter: Payer: Self-pay | Admitting: Obstetrics and Gynecology

## 2018-05-19 DIAGNOSIS — Z3400 Encounter for supervision of normal first pregnancy, unspecified trimester: Secondary | ICD-10-CM

## 2018-05-19 DIAGNOSIS — Z3403 Encounter for supervision of normal first pregnancy, third trimester: Secondary | ICD-10-CM

## 2018-05-19 NOTE — Progress Notes (Signed)
Pt presents for ROB states she has the flu; however, temp is 97.1.

## 2018-05-19 NOTE — Patient Instructions (Signed)
For colds and allergies  Any anti-histamine including benadryl, allegra, claritin, etc.  Sudafed but not phenylephrine  Mucinex  Robitussin  For Reflux/heartburn  Pepcid Zantac Tums Prilosec Prevacid  For yeast infections  Monistat  For constipation  Colace  For minor aches and pains  Tylenol-do not take more than 4000mg in 24 hours. Therma-care or like heat packs  

## 2018-05-19 NOTE — Progress Notes (Signed)
   PRENATAL VISIT NOTE  Subjective:  Deborah White is a 32 y.o. G1P0 at 7151w4d being seen today for ongoing prenatal care.  She is currently monitored for the following issues for this low-risk pregnancy and has Vitamin D deficiency; Anemia; Breast lump in female; Family history of breast cancer in mother; Supervision of normal first pregnancy, antepartum; and Obesity affecting pregnancy, antepartum on their problem list.  Patient reports running nose, running eyes, congestion, cough.  Contractions: Irregular. Vag. Bleeding: None.  Movement: Present. Denies leaking of fluid.   The following portions of the patient's history were reviewed and updated as appropriate: allergies, current medications, past family history, past medical history, past social history, past surgical history and problem list. Problem list updated.  Objective:   Vitals:   05/19/18 1446  BP: 132/76  Pulse: (!) 108  Weight: 229 lb 9.6 oz (104.1 kg)    Fetal Status: Fetal Heart Rate (bpm): 143   Movement: Present     General:  Alert, oriented and cooperative. Patient is in no acute distress.  Skin: Skin is warm and dry. No rash noted.   Cardiovascular: Normal heart rate noted  Respiratory: Normal respiratory effort, no problems with respiration noted  Abdomen: Soft, gravid, appropriate for gestational age.  Pain/Pressure: Present     Pelvic: Cervical exam deferred        Extremities: Normal range of motion.  Edema: Moderate pitting, indentation subsides rapidly  Mental Status: Normal mood and affect. Normal behavior. Normal judgment and thought content.   Assessment and Plan:  Pregnancy: G1P0 at 5951w4d  1. Supervision of normal first pregnancy, antepartum Reports occasional decreased movement, reviewed kick counts and to present to [facility] with any concerns or decreased movement  2. Cold Reviewed safe meds to take during pregnancy  Term labor symptoms and general obstetric precautions including but not limited  to vaginal bleeding, contractions, leaking of fluid and fetal movement were reviewed in detail with the patient. Please refer to After Visit Summary for other counseling recommendations.  Return in about 1 week (around 05/26/2018) for OB visit.  No future appointments.  Conan BowensKelly M Ramiyah Mcclenahan, MD

## 2018-05-26 ENCOUNTER — Telehealth: Payer: Self-pay

## 2018-05-26 ENCOUNTER — Encounter: Payer: Self-pay | Admitting: Obstetrics and Gynecology

## 2018-05-26 ENCOUNTER — Ambulatory Visit (INDEPENDENT_AMBULATORY_CARE_PROVIDER_SITE_OTHER): Payer: Self-pay | Admitting: Obstetrics and Gynecology

## 2018-05-26 DIAGNOSIS — Z3400 Encounter for supervision of normal first pregnancy, unspecified trimester: Secondary | ICD-10-CM

## 2018-05-26 DIAGNOSIS — Z3403 Encounter for supervision of normal first pregnancy, third trimester: Secondary | ICD-10-CM

## 2018-05-26 NOTE — Patient Instructions (Signed)
Vaginal Delivery Vaginal delivery means that you will give birth by pushing your baby out of your birth canal (vagina). A team of health care providers will help you before, during, and after vaginal delivery. Birth experiences are unique for every woman and every pregnancy, and birth experiences vary depending on where you choose to give birth. What should I do to prepare for my baby's birth? Before your baby is born, it is important to talk with your health care provider about:  Your labor and delivery preferences. These may include: ? Medicines that you may be given. ? How you will manage your pain. This might include non-medical pain relief techniques or injectable pain relief such as epidural analgesia. ? How you and your baby will be monitored during labor and delivery. ? Who may be in the labor and delivery room with you. ? Your feelings about surgical delivery of your baby (cesarean delivery, or C-section) if this becomes necessary. ? Your feelings about receiving donated blood through an IV tube (blood transfusion) if this becomes necessary.  Whether you are able: ? To take pictures or videos of the birth. ? To eat during labor and delivery. ? To move around, walk, or change positions during labor and delivery.  What to expect after your baby is born, such as: ? Whether delayed umbilical cord clamping and cutting is offered. ? Who will care for your baby right after birth. ? Medicines or tests that may be recommended for your baby. ? Whether breastfeeding is supported in your hospital or birth center. ? How long you will be in the hospital or birth center.  How any medical conditions you have may affect your baby or your labor and delivery experience.  To prepare for your baby's birth, you should also:  Attend all of your health care visits before delivery (prenatal visits) as recommended by your health care provider. This is important.  Prepare your home for your baby's  arrival. Make sure that you have: ? Diapers. ? Baby clothing. ? Feeding equipment. ? Safe sleeping arrangements for you and your baby.  Install a car seat in your vehicle. Have your car seat checked by a certified car seat installer to make sure that it is installed safely.  Think about who will help you with your new baby at home for at least the first several weeks after delivery.  What can I expect when I arrive at the birth center or hospital? Once you are in labor and have been admitted into the hospital or birth center, your health care provider may:  Review your pregnancy history and any concerns you have.  Insert an IV tube into one of your veins. This is used to give you fluids and medicines.  Check your blood pressure, pulse, temperature, and heart rate (vital signs).  Check whether your bag of water (amniotic sac) has broken (ruptured).  Talk with you about your birth plan and discuss pain control options.  Monitoring Your health care provider may monitor your contractions (uterine monitoring) and your baby's heart rate (fetal monitoring). You may need to be monitored:  Often, but not continuously (intermittently).  All the time or for long periods at a time (continuously). Continuous monitoring may be needed if: ? You are taking certain medicines, such as medicine to relieve pain or make your contractions stronger. ? You have pregnancy or labor complications.  Monitoring may be done by:  Placing a special stethoscope or a handheld monitoring device on your abdomen to   check your baby's heartbeat, and feeling your abdomen for contractions. This method of monitoring does not continuously record your baby's heartbeat or your contractions.  Placing monitors on your abdomen (external monitors) to record your baby's heartbeat and the frequency and length of contractions. You may not have to wear external monitors all the time.  Placing monitors inside of your uterus  (internal monitors) to record your baby's heartbeat and the frequency, length, and strength of your contractions. ? Your health care provider may use internal monitors if he or she needs more information about the strength of your contractions or your baby's heart rate. ? Internal monitors are put in place by passing a thin, flexible wire through your vagina and into your uterus. Depending on the type of monitor, it may remain in your uterus or on your baby's head until birth. ? Your health care provider will discuss the benefits and risks of internal monitoring with you and will ask for your permission before inserting the monitors.  Telemetry. This is a type of continuous monitoring that can be done with external or internal monitors. Instead of having to stay in bed, you are able to move around during telemetry. Ask your health care provider if telemetry is an option for you.  Physical exam Your health care provider may perform a physical exam. This may include:  Checking whether your baby is positioned: ? With the head toward your vagina (head-down). This is most common. ? With the head toward the top of your uterus (head-up or breech). If your baby is in a breech position, your health care provider may try to turn your baby to a head-down position so you can deliver vaginally. If it does not seem that your baby can be born vaginally, your provider may recommend surgery to deliver your baby. In rare cases, you may be able to deliver vaginally if your baby is head-up (breech delivery). ? Lying sideways (transverse). Babies that are lying sideways cannot be delivered vaginally.  Checking your cervix to determine: ? Whether it is thinning out (effacing). ? Whether it is opening up (dilating). ? How low your baby has moved into your birth canal.  What are the three stages of labor and delivery?  Normal labor and delivery is divided into the following three stages: Stage 1  Stage 1 is the  longest stage of labor, and it can last for hours or days. Stage 1 includes: ? Early labor. This is when contractions may be irregular, or regular and mild. Generally, early labor contractions are more than 10 minutes apart. ? Active labor. This is when contractions get longer, more regular, more frequent, and more intense. ? The transition phase. This is when contractions happen very close together, are very intense, and may last longer than during any other part of labor.  Contractions generally feel mild, infrequent, and irregular at first. They get stronger, more frequent (about every 2-3 minutes), and more regular as you progress from early labor through active labor and transition.  Many women progress through stage 1 naturally, but you may need help to continue making progress. If this happens, your health care provider may talk with you about: ? Rupturing your amniotic sac if it has not ruptured yet. ? Giving you medicine to help make your contractions stronger and more frequent.  Stage 1 ends when your cervix is completely dilated to 4 inches (10 cm) and completely effaced. This happens at the end of the transition phase. Stage 2  Once   your cervix is completely effaced and dilated to 4 inches (10 cm), you may start to feel an urge to push. It is common for the body to naturally take a rest before feeling the urge to push, especially if you received an epidural or certain other pain medicines. This rest period may last for up to 1-2 hours, depending on your unique labor experience.  During stage 2, contractions are generally less painful, because pushing helps relieve contraction pain. Instead of contraction pain, you may feel stretching and burning pain, especially when the widest part of your baby's head passes through the vaginal opening (crowning).  Your health care provider will closely monitor your pushing progress and your baby's progress through the vagina during stage 2.  Your  health care provider may massage the area of skin between your vaginal opening and anus (perineum) or apply warm compresses to your perineum. This helps it stretch as the baby's head starts to crown, which can help prevent perineal tearing. ? In some cases, an incision may be made in your perineum (episiotomy) to allow the baby to pass through the vaginal opening. An episiotomy helps to make the opening of the vagina larger to allow more room for the baby to fit through.  It is very important to breathe and focus so your health care provider can control the delivery of your baby's head. Your health care provider may have you decrease the intensity of your pushing, to help prevent perineal tearing.  After delivery of your baby's head, the shoulders and the rest of the body generally deliver very quickly and without difficulty.  Once your baby is delivered, the umbilical cord may be cut right away, or this may be delayed for 1-2 minutes, depending on your baby's health. This may vary among health care providers, hospitals, and birth centers.  If you and your baby are healthy enough, your baby may be placed on your chest or abdomen to help maintain the baby's temperature and to help you bond with each other. Some mothers and babies start breastfeeding at this time. Your health care team will dry your baby and help keep your baby warm during this time.  Your baby may need immediate care if he or she: ? Showed signs of distress during labor. ? Has a medical condition. ? Was born too early (prematurely). ? Had a bowel movement before birth (meconium). ? Shows signs of difficulty transitioning from being inside the uterus to being outside of the uterus. If you are planning to breastfeed, your health care team will help you begin a feeding. Stage 3  The third stage of labor starts immediately after the birth of your baby and ends after you deliver the placenta. The placenta is an organ that develops  during pregnancy to provide oxygen and nutrients to your baby in the womb.  Delivering the placenta may require some pushing, and you may have mild contractions. Breastfeeding can stimulate contractions to help you deliver the placenta.  After the placenta is delivered, your uterus should tighten (contract) and become firm. This helps to stop bleeding in your uterus. To help your uterus contract and to control bleeding, your health care provider may: ? Give you medicine by injection, through an IV tube, by mouth, or through your rectum (rectally). ? Massage your abdomen or perform a vaginal exam to remove any blood clots that are left in your uterus. ? Empty your bladder by placing a thin, flexible tube (catheter) into your bladder. ? Encourage   you to breastfeed your baby. After labor is over, you and your baby will be monitored closely to ensure that you are both healthy until you are ready to go home. Your health care team will teach you how to care for yourself and your baby. This information is not intended to replace advice given to you by your health care provider. Make sure you discuss any questions you have with your health care provider. Document Released: 09/01/2008 Document Revised: 06/12/2016 Document Reviewed: 12/08/2015 Elsevier Interactive Patient Education  2018 [employer]  

## 2018-05-26 NOTE — Telephone Encounter (Signed)
Left VM message to come in early for appt. If possible.

## 2018-05-26 NOTE — Progress Notes (Signed)
Subjective:  Deborah White is a 32 y.o. G1P0 at 8775w4d being seen today for ongoing prenatal care.  She is currently monitored for the following issues for this low-risk pregnancy and has Vitamin D deficiency; Anemia; Breast lump in female; Family history of breast cancer in mother; Supervision of normal first pregnancy, antepartum; and Obesity affecting pregnancy, antepartum on their problem list.  Patient reports general discomforts of pregnancy.  Contractions: Irritability. Vag. Bleeding: None.  Movement: Present. Denies leaking of fluid.   The following portions of the patient's history were reviewed and updated as appropriate: allergies, current medications, past family history, past medical history, past social history, past surgical history and problem list. Problem list updated.  Objective:   Vitals:   05/26/18 1454  BP: 130/80  Pulse: 97  Weight: 230 lb 11.2 oz (104.6 kg)    Fetal Status: Fetal Heart Rate (bpm): 148   Movement: Present     General:  Alert, oriented and cooperative. Patient is in no acute distress.  Skin: Skin is warm and dry. No rash noted.   Cardiovascular: Normal heart rate noted  Respiratory: Normal respiratory effort, no problems with respiration noted  Abdomen: Soft, gravid, appropriate for gestational age. Pain/Pressure: Absent     Pelvic:  Cervical exam deferred        Extremities: Normal range of motion.  Edema: Moderate pitting, indentation subsides rapidly  Mental Status: Normal mood and affect. Normal behavior. Normal judgment and thought content.   Urinalysis:      Assessment and Plan:  Pregnancy: G1P0 at 2675w4d  1. Supervision of normal first pregnancy, antepartum Labor precautions  Term labor symptoms and general obstetric precautions including but not limited to vaginal bleeding, contractions, leaking of fluid and fetal movement were reviewed in detail with the patient. Please refer to After Visit Summary for other counseling  recommendations.  Return in about 1 week (around 06/02/2018) for OB visit.   Hermina StaggersErvin, Essense Bousquet L, MD

## 2018-06-02 ENCOUNTER — Encounter: Payer: Self-pay | Admitting: Obstetrics & Gynecology

## 2018-06-02 ENCOUNTER — Ambulatory Visit: Payer: Self-pay | Admitting: Obstetrics & Gynecology

## 2018-06-02 VITALS — BP 128/77 | HR 96 | Wt 232.0 lb

## 2018-06-02 DIAGNOSIS — O26893 Other specified pregnancy related conditions, third trimester: Secondary | ICD-10-CM

## 2018-06-02 DIAGNOSIS — N898 Other specified noninflammatory disorders of vagina: Secondary | ICD-10-CM

## 2018-06-02 DIAGNOSIS — Z3400 Encounter for supervision of normal first pregnancy, unspecified trimester: Secondary | ICD-10-CM

## 2018-06-02 DIAGNOSIS — Z113 Encounter for screening for infections with a predominantly sexual mode of transmission: Secondary | ICD-10-CM

## 2018-06-02 NOTE — Patient Instructions (Signed)
Return to clinic for any scheduled appointments or obstetric concerns, or go to [facility] for evaluation  

## 2018-06-02 NOTE — Progress Notes (Signed)
   PRENATAL VISIT NOTE  Subjective:  Deborah White is a 32 y.o. G1P0 at 576w4d being seen today for ongoing prenatal care.  She is currently monitored for the following issues for this low-risk pregnancy and has Vitamin D deficiency; Anemia; Breast lump in female; Family history of breast cancer in mother; Supervision of normal first pregnancy, antepartum; and Obesity affecting pregnancy, antepartum on their problem list.  Patient reports occasional contractions.  Contractions: Irritability. Vag. Bleeding: None.  Movement: Present. Denies leaking of fluid.   The following portions of the patient's history were reviewed and updated as appropriate: allergies, current medications, past family history, past medical history, past social history, past surgical history and problem list. Problem list updated.  Objective:   Vitals:   06/02/18 1004  BP: 128/77  Pulse: 96  Weight: 232 lb (105.2 kg)    Fetal Status: Fetal Heart Rate (bpm): 158   Movement: Present  Presentation: Vertex  General:  Alert, oriented and cooperative. Patient is in no acute distress.  Skin: Skin is warm and dry. No rash noted.   Cardiovascular: Normal heart rate noted  Respiratory: Normal respiratory effort, no problems with respiration noted  Abdomen: Soft, gravid, appropriate for gestational age.  Pain/Pressure: Present     Pelvic: Cervical exam performed Dilation: Closed Effacement (%): Thick Station: Ballotable Thick, yellow, curdy discharge seen, testing sample obtained  Extremities: Normal range of motion.  Edema: Mild pitting, slight indentation  Mental Status: Normal mood and affect. Normal behavior. Normal judgment and thought content.   Assessment and Plan:  Pregnancy: G1P0 at 676w4d  1. Vaginal discharge during pregnancy in third trimester - Cervicovaginal ancillary only done, will follow up results and manage accordingly.  2. Supervision of normal first pregnancy, antepartum Postdates testing to start  next week. IOL at 41 weeks, order sent. Term labor symptoms and general obstetric precautions including but not limited to vaginal bleeding, contractions, leaking of fluid and fetal movement were reviewed in detail with the patient. Please refer to After Visit Summary for other counseling recommendations.  Return in about 4 days (around 06/06/2018) for NST and AFI   1 week: OB visit and NST.  No future appointments.  Jaynie CollinsUgonna Latitia Housewright, MD

## 2018-06-03 ENCOUNTER — Encounter (HOSPITAL_COMMUNITY): Payer: Self-pay | Admitting: *Deleted

## 2018-06-03 ENCOUNTER — Telehealth (HOSPITAL_COMMUNITY): Payer: Self-pay | Admitting: *Deleted

## 2018-06-03 LAB — CERVICOVAGINAL ANCILLARY ONLY
Bacterial vaginitis: NEGATIVE
Candida vaginitis: POSITIVE — AB
Trichomonas: NEGATIVE

## 2018-06-03 MED ORDER — TERCONAZOLE 0.8 % VA CREA: 1.0000 | TOPICAL_CREAM | Freq: Every day | VAGINAL | 0 refills | Status: DC

## 2018-06-03 NOTE — Telephone Encounter (Signed)
Preadmission screen  

## 2018-06-03 NOTE — Addendum Note (Signed)
Addended by: Jaynie CollinsANYANWU, Dalten Ambrosino A on: 06/03/2018 06:19 PM   Modules accepted: Orders

## 2018-06-06 ENCOUNTER — Ambulatory Visit (INDEPENDENT_AMBULATORY_CARE_PROVIDER_SITE_OTHER): Payer: BLUE CROSS/BLUE SHIELD | Admitting: Obstetrics and Gynecology

## 2018-06-06 ENCOUNTER — Encounter: Payer: Self-pay | Admitting: Obstetrics and Gynecology

## 2018-06-06 ENCOUNTER — Other Ambulatory Visit: Payer: Self-pay

## 2018-06-06 VITALS — BP 131/73 | HR 99 | Wt 235.0 lb

## 2018-06-06 DIAGNOSIS — Z3403 Encounter for supervision of normal first pregnancy, third trimester: Secondary | ICD-10-CM

## 2018-06-06 DIAGNOSIS — Z3400 Encounter for supervision of normal first pregnancy, unspecified trimester: Secondary | ICD-10-CM

## 2018-06-06 DIAGNOSIS — O99213 Obesity complicating pregnancy, third trimester: Secondary | ICD-10-CM

## 2018-06-06 DIAGNOSIS — E669 Obesity, unspecified: Secondary | ICD-10-CM

## 2018-06-06 DIAGNOSIS — O480 Post-term pregnancy: Secondary | ICD-10-CM

## 2018-06-06 DIAGNOSIS — O99210 Obesity complicating pregnancy, unspecified trimester: Secondary | ICD-10-CM

## 2018-06-06 NOTE — Patient Instructions (Signed)

## 2018-06-06 NOTE — Progress Notes (Signed)
   PRENATAL VISIT NOTE  Subjective:  Deborah White is a 32 y.o. G1P0 at 5537w1d being seen today for ongoing prenatal care.  She is currently monitored for the following issues for this low-risk pregnancy and has Vitamin D deficiency; Anemia; Breast lump in female; Family history of breast cancer in mother; Supervision of normal first pregnancy, antepartum; and Obesity affecting pregnancy, antepartum on their problem list.  Patient reports no complaints.  Contractions: Not present. Vag. Bleeding: None.  Movement: Present. Denies leaking of fluid.   The following portions of the patient's history were reviewed and updated as appropriate: allergies, current medications, past family history, past medical history, past social history, past surgical history and problem list. Problem list updated.  Objective:   Vitals:   06/06/18 1019  BP: 131/73  Pulse: 99  Weight: 235 lb (106.6 kg)    Fetal Status: Fetal Heart Rate (bpm): NST   Movement: Present  Presentation: Vertex  General:  Alert, oriented and cooperative. Patient is in no acute distress.  Skin: Skin is warm and dry. No rash noted.   Cardiovascular: Normal heart rate noted  Respiratory: Normal respiratory effort, no problems with respiration noted  Abdomen: Soft, gravid, appropriate for gestational age.  Pain/Pressure: Present     Pelvic: Cervical exam deferred        Extremities: Normal range of motion.  Edema: Mild pitting, slight indentation  Mental Status: Normal mood and affect. Normal behavior. Normal judgment and thought content.   Assessment and Plan:  Pregnancy: G1P0 at 3437w1d  1. Supervision of normal first pregnancy, antepartum Patient is doing well without complaints Discussed IOL Plan for cervical foley placement on 7/6 in [facility]. Patient scheduled for induction of labor 7/7 NST reviewed and reactive with abseline 150, mod variability, +accels, no decels NST 7/5 - US OB Limited; Future - Fetal nonstress test;  Future  2. Obesity affecting pregnancy, antepartum   Term labor symptoms and general obstetric precautions including but not limited to vaginal bleeding, contractions, leaking of fluid and fetal movement were reviewed in detail with the patient. Please refer to After Visit Summary for other counseling recommendations.  Return in about 6 weeks (around 07/18/2018).  Future Appointments  Date Time Provider Department Center  06/10/2018 10:30 AM [facility] None  06/12/2018  7:00 AM [facility] None    Catalina AntiguaPeggy Haadiya Frogge, MD

## 2018-06-10 ENCOUNTER — Inpatient Hospital Stay (HOSPITAL_COMMUNITY): Payer: BLUE CROSS/BLUE SHIELD | Admitting: Anesthesiology

## 2018-06-10 ENCOUNTER — Encounter (HOSPITAL_COMMUNITY): Payer: Self-pay

## 2018-06-10 ENCOUNTER — Other Ambulatory Visit: Payer: BLUE CROSS/BLUE SHIELD

## 2018-06-10 ENCOUNTER — Other Ambulatory Visit: Payer: Self-pay

## 2018-06-10 ENCOUNTER — Inpatient Hospital Stay (HOSPITAL_COMMUNITY)
Admission: AD | Admit: 2018-06-10 | Discharge: 2018-06-14 | DRG: 788 | Disposition: A | Discharge status: Home / Self Care | Payer: BLUE CROSS/BLUE SHIELD | Attending: Family Medicine | Admitting: Family Medicine

## 2018-06-10 DIAGNOSIS — O9902 Anemia complicating childbirth: Secondary | ICD-10-CM | POA: Diagnosis present

## 2018-06-10 DIAGNOSIS — E669 Obesity, unspecified: Secondary | ICD-10-CM | POA: Diagnosis present

## 2018-06-10 DIAGNOSIS — O99214 Obesity complicating childbirth: Secondary | ICD-10-CM | POA: Diagnosis present

## 2018-06-10 DIAGNOSIS — Z3490 Encounter for supervision of normal pregnancy, unspecified, unspecified trimester: Secondary | ICD-10-CM

## 2018-06-10 DIAGNOSIS — O480 Post-term pregnancy: Secondary | ICD-10-CM | POA: Diagnosis not present

## 2018-06-10 DIAGNOSIS — Z3A40 40 weeks gestation of pregnancy: Secondary | ICD-10-CM | POA: Diagnosis not present

## 2018-06-10 DIAGNOSIS — D649 Anemia, unspecified: Secondary | ICD-10-CM | POA: Diagnosis present

## 2018-06-10 DIAGNOSIS — O99210 Obesity complicating pregnancy, unspecified trimester: Secondary | ICD-10-CM | POA: Diagnosis present

## 2018-06-10 DIAGNOSIS — Z3400 Encounter for supervision of normal first pregnancy, unspecified trimester: Secondary | ICD-10-CM

## 2018-06-10 DIAGNOSIS — Z87891 Personal history of nicotine dependence: Secondary | ICD-10-CM | POA: Diagnosis not present

## 2018-06-10 DIAGNOSIS — Z3483 Encounter for supervision of other normal pregnancy, third trimester: Secondary | ICD-10-CM | POA: Diagnosis present

## 2018-06-10 LAB — CBC
HCT: 37.3 % (ref 36.0–46.0)
Hemoglobin: 12.7 g/dL (ref 12.0–15.0)
MCH: 28.9 pg (ref 26.0–34.0)
MCHC: 34 g/dL (ref 30.0–36.0)
MCV: 84.8 fL (ref 78.0–100.0)
Platelets: 205 10*3/uL (ref 150–400)
RBC: 4.4 MIL/uL (ref 3.87–5.11)
RDW: 13.8 % (ref 11.5–15.5)
WBC: 14.4 10*3/uL — ABNORMAL HIGH (ref 4.0–10.5)

## 2018-06-10 LAB — TYPE AND SCREEN
ABO/RH(D): O POS
Antibody Screen: NEGATIVE

## 2018-06-10 LAB — ABO/RH: ABO/RH(D): O POS

## 2018-06-10 MED ORDER — ACETAMINOPHEN 325 MG PO TABS: 650.0000 mg | ORAL_TABLET | ORAL | Status: DC | PRN

## 2018-06-10 MED ORDER — SOD CITRATE-CITRIC ACID 500-334 MG/5ML PO SOLN
30.0000 mL | ORAL | Status: DC | PRN
  Administered 2018-06-11: 30 mL via ORAL
  Filled 2018-06-10 (×2): qty 15

## 2018-06-10 MED ORDER — LIDOCAINE HCL (PF) 1 % IJ SOLN: 30.0000 mL | INTRAMUSCULAR | Status: DC | PRN

## 2018-06-10 MED ORDER — LACTATED RINGERS IV SOLN
500.0000 mL | INTRAVENOUS | Status: DC | PRN
  Administered 2018-06-10: 500 mL via INTRAVENOUS
  Administered 2018-06-10: 1000 mL via INTRAVENOUS
  Administered 2018-06-10: 500 mL via INTRAVENOUS

## 2018-06-10 MED ORDER — LIDOCAINE HCL (PF) 1 % IJ SOLN
INTRAMUSCULAR | Status: DC | PRN
  Administered 2018-06-10: 13 mL via EPIDURAL

## 2018-06-10 MED ORDER — PHENYLEPHRINE 40 MCG/ML (10ML) SYRINGE FOR IV PUSH (FOR BLOOD PRESSURE SUPPORT)
80.0000 ug | PREFILLED_SYRINGE | INTRAVENOUS | Status: DC | PRN
  Filled 2018-06-10: qty 10

## 2018-06-10 MED ORDER — EPHEDRINE 5 MG/ML INJ: 10.0000 mg | INTRAVENOUS | Status: DC | PRN

## 2018-06-10 MED ORDER — PHENYLEPHRINE 40 MCG/ML (10ML) SYRINGE FOR IV PUSH (FOR BLOOD PRESSURE SUPPORT)
80.0000 ug | PREFILLED_SYRINGE | INTRAVENOUS | Status: DC | PRN
  Administered 2018-06-10: 80 ug via INTRAVENOUS

## 2018-06-10 MED ORDER — LACTATED RINGERS IV SOLN
INTRAVENOUS | Status: DC
  Administered 2018-06-10 – 2018-06-11 (×5): via INTRAVENOUS

## 2018-06-10 MED ORDER — OXYTOCIN BOLUS FROM INFUSION: 500.0000 mL | Freq: Once | INTRAVENOUS | Status: DC

## 2018-06-10 MED ORDER — OXYTOCIN 40 UNITS IN LACTATED RINGERS INFUSION - SIMPLE MED: 2.5000 [IU]/h | INTRAVENOUS | Status: DC

## 2018-06-10 MED ORDER — TERBUTALINE SULFATE 1 MG/ML IJ SOLN
0.2500 mg | Freq: Once | INTRAMUSCULAR | Status: DC | PRN
  Filled 2018-06-10: qty 1

## 2018-06-10 MED ORDER — LACTATED RINGERS IV SOLN
INTRAVENOUS | Status: DC
  Administered 2018-06-10: 14:00:00 via INTRAUTERINE

## 2018-06-10 MED ORDER — ONDANSETRON HCL 4 MG/2ML IJ SOLN
4.0000 mg | Freq: Four times a day (QID) | INTRAMUSCULAR | Status: DC | PRN
  Filled 2018-06-10: qty 2

## 2018-06-10 MED ORDER — DIPHENHYDRAMINE HCL 50 MG/ML IJ SOLN: 12.5000 mg | INTRAMUSCULAR | Status: DC | PRN

## 2018-06-10 MED ORDER — FENTANYL 2.5 MCG/ML BUPIVACAINE 1/10 % EPIDURAL INFUSION (WH - ANES)
14.0000 mL/h | INTRAMUSCULAR | Status: DC | PRN
  Administered 2018-06-10 (×2): 14 mL/h via EPIDURAL
  Filled 2018-06-10 (×2): qty 100

## 2018-06-10 MED ORDER — LACTATED RINGERS IV SOLN: 500.0000 mL | Freq: Once | INTRAVENOUS | Status: DC

## 2018-06-10 MED ORDER — FENTANYL CITRATE (PF) 100 MCG/2ML IJ SOLN
100.0000 ug | INTRAMUSCULAR | Status: DC | PRN
  Filled 2018-06-10: qty 2

## 2018-06-10 MED ORDER — OXYTOCIN 40 UNITS IN LACTATED RINGERS INFUSION - SIMPLE MED
1.0000 m[IU]/min | INTRAVENOUS | Status: DC
  Administered 2018-06-10: 1 m[IU]/min via INTRAVENOUS
  Administered 2018-06-10: 4 m[IU]/min via INTRAVENOUS
  Filled 2018-06-10: qty 1000

## 2018-06-10 NOTE — Anesthesia Preprocedure Evaluation (Signed)
Anesthesia Evaluation  Patient identified by MRN, date of birth, ID band Patient awake    Reviewed: Allergy & Precautions, NPO status , Patient's Chart, lab work & pertinent test results  Airway Mallampati: II  TM Distance: >3 FB Neck ROM: Full    Dental no notable dental hx.    Pulmonary neg pulmonary ROS, former smoker,    Pulmonary exam normal breath sounds clear to auscultation       Cardiovascular negative cardio ROS Normal cardiovascular exam Rhythm:Regular Rate:Normal     Neuro/Psych negative neurological ROS  negative psych ROS   GI/Hepatic negative GI ROS, Neg liver ROS,   Endo/Other  negative endocrine ROS  Renal/GU negative Renal ROS  negative genitourinary   Musculoskeletal negative musculoskeletal ROS (+)   Abdominal   Peds negative pediatric ROS (+)  Hematology negative hematology ROS (+)   Anesthesia Other Findings   Reproductive/Obstetrics negative OB ROS (+) Pregnancy                             Anesthesia Physical Anesthesia Plan  ASA: II  Anesthesia Plan: Epidural   Post-op Pain Management:    Induction:   PONV Risk Score and Plan:   Airway Management Planned:   Additional Equipment:   Intra-op Plan:   Post-operative Plan:   Informed Consent:   Plan Discussed with:   Anesthesia Plan Comments:         Anesthesia Quick Evaluation  

## 2018-06-10 NOTE — Progress Notes (Signed)
Labor Progress Note Deborah White is a 32 y.o. G1P0 at 9071w5d presented for SOL.  S: Patient with recurrent late/variable decels noted on strip. Patient comfortable.  O:  BP 131/64   Pulse 97   Temp 99.1 F (37.3 C) (Axillary)   Resp 16   Ht 5\' 5"  (1.651 m)   Wt 232 lb (105.2 kg)   LMP 09/07/2017   SpO2 99%   BMI 38.61 kg/m   XBM:[id number]:baseline rate 155, moderate variability, no acels, recurrent late/variable decels Toco: ctx every 2-3 min  CVE: Dilation: 5 Effacement (%): 50 Cervical Position: Posterior Station: -3 Presentation: Vertex Exam by:: Fabian NovemberM. Bhambri CNM   A&P: 32 y.o. G1P0 3571w5d here for IOL for recurrent late decels. Labor: Progressing slowly. Not much cervical change although with adequate MVUs. Will continue Pitocin at current rate, continuing to monitor.  Pain: Epidural FWB: Cat II, with good variability and return to baseline despite recurrent late decels. Will continue to monitor.  Ellwood DenseAlison Steel Kerney, DO 11:52 PM

## 2018-06-10 NOTE — Progress Notes (Signed)
Deborah White is a 32 y.o. G1P0 at 3057w5d by  ultrasound admitted for SOL.  Subjective: S/p epidural. Denies pain. Reports intermittent feelings of lightheadedness.FOB and family at bedside and supportive.   Objective: BP 110/66   Pulse 85   Temp 98.1 F (36.7 C) (Oral)   Resp 18   Ht 5\' 5"  (1.651 m)   Wt 232 lb (105.2 kg)   LMP 09/07/2017   SpO2 99%   BMI 38.61 kg/m  No intake/output data recorded. No intake/output data recorded.  FHT:  FHR: 155 bpm, variability: moderate,  accelerations:  Present,  decelerations:  Present late decel, rare variable decel  UC:   irregular, every 1-7 minutes SVE:   Dilation: 2.5 Effacement (%): 50 Station: -3 Exam by:: Thalia BloodgoodSam Weinhold, CNM  Labs: Lab Results  Component Value Date   WBC 14.4 (H) 06/10/2018   HGB 12.7 06/10/2018   HCT 37.3 06/10/2018   MCV 84.8 06/10/2018   PLT 205 06/10/2018    Assessment / Plan: Labor: Unable to start Pitocin due to Category II EFM. Planning to introduce nipple stim with manual pump  SROM 1248, IUPC with amnioinfusion in place Preeclampsia:  N/A Fetal Wellbeing:  Category II Pain Control:  Epidural I/D:  n/a Anticipated MOD:  NSVD   Calvert CantorSamantha C Weinhold, CNM 06/10/2018, 3:07 PM

## 2018-06-10 NOTE — Progress Notes (Signed)
Deborah White is a 32 y.o. G1P0 at 4611w5d by 14 week  ultrasound admitted for active labor  Subjective: Patient coping well with contractions. Describes pain as low abdominal cramping 6/10 but less intense than when she was seen in MAU earlier today.  Objective: BP 102/63   Pulse 92   Temp 98.2 F (36.8 C) (Axillary)   Resp 18   Ht 5\' 5"  (1.651 m)   Wt 232 lb (105.2 kg)   LMP 09/07/2017   SpO2 100%   BMI 38.61 kg/m  No intake/output data recorded. No intake/output data recorded.  FHT:  FHR: 155 bpm, variability: moderate,  accelerations:  Present,  decelerations:  Present variables and lates UC:   irregular, every 2-6 minutes SVE:   Dilation: 2 Effacement (%): Thick Station: -3 Exam by:: Deborah White, CNM  Labs: Lab Results  Component Value Date   WBC 14.4 (H) 06/10/2018   HGB 12.7 06/10/2018   HCT 37.3 06/10/2018   MCV 84.8 06/10/2018   PLT 205 06/10/2018    Assessment / Plan: SOL with some cervical change  Labor: Progressing normally Preeclampsia:  N/A Fetal Wellbeing:  Category I, previously Cat II Pain Control:  Labor support without medications, planning epidural I/D:  n/a Anticipated MOD:  NSVD  Calvert CantorSamantha C Hanako Tipping, CNM 06/10/2018, 9:47 AM

## 2018-06-10 NOTE — H&P (Addendum)
OBSTETRIC ADMISSION HISTORY AND PHYSICAL  Deborah White is a 32 y.o. female G1P0 with IUP at 4w5dby 14wk [place] for SOL. She reports +FMs, No LOF, no VB, no blurry vision, headaches or peripheral edema, and RUQ pain. She plans on breast feeding. She request OCPs for birth control. [facility], sustained 8 min prolonged decel to 76s Applied oxygen to mother, positional changes and started IVF bolus with subsequent return to baseline.   Dating: By 14wk [place]--->  Estimated Date of Delivery: 06/05/18  Sono:   '@[redacted]w[redacted]d' , CWD, normal anatomy, cephalic presentation, 16606T 40% EFW. Anterior placenta. AFI wnl.  Prenatal History/Complications: Anemia during pregnancy Vitamin D deficiency Obesity  Past Medical History: Past Medical History:  Diagnosis Date  . Anemia   . Thyroid disease    Past Surgical History: Past Surgical History:  Procedure Laterality Date  . BACK SURGERY  2010   cyst removal  . WRIST SURGERY Left 2011   Cyst   Obstetrical History: OB History    Gravida  1   Para      Term      Preterm      AB      Living  0     SAB      TAB      Ectopic      Multiple      Live Births             Social History: Social History   Socioeconomic History  . Marital status: Single    Spouse name: Not on file  . Number of children: Not on file  . Years of education: Not on file  . Highest education level: Not on file  Occupational History  . Not on file  Social Needs  . Financial resource strain: Not on file  . Food insecurity:    Worry: Not on file    Inability: Not on file  . Transportation needs:    Medical: Not on file    Non-medical: Not on file  Tobacco Use  . Smoking status: Former Smoker    Types: Cigarettes    Last attempt to quit: 06/23/2016    Years since quitting: 1.9  . Smokeless tobacco: Never Used  Substance and Sexual Activity  . Alcohol use: No    Alcohol/week: 0.0  oz    Frequency: Never  . Drug use: No  . Sexual activity: Yes    Partners: Male    Birth control/protection: None  Lifestyle  . Physical activity:    Days per week: Not on file    Minutes per session: Not on file  . Stress: Not on file  Relationships  . Social connections:    Talks on phone: Not on file    Gets together: Not on file    Attends religious service: Not on file    Active member of club or organization: Not on file    Attends meetings of clubs or organizations: Not on file    Relationship status: Not on file  Other Topics Concern  . Not on file  Social History Narrative  . Not on file    Family History: Family History  Problem Relation Age of Onset  . Breast cancer Mother   . Hypertension Mother   . Cancer Maternal Uncle        brain cancer   . Hypertension Maternal Grandmother     Allergies: No Known  Allergies  Medications Prior to Admission  Medication Sig Dispense Refill Last Dose  . PRENATAL 27-1 MG TABS Take 1 tablet by mouth daily. 90 each 4 Past Week at Unknown time  . terconazole (TERAZOL 3) 0.8 % vaginal cream Place 1 applicator vaginally at bedtime. Apply nightly for three nights. 20 g 0 Past Month at Unknown time  . Cholecalciferol (VITAMIN D) 2000 units tablet Take 2,000 Units by mouth daily.   More than a month at Unknown time  . valACYclovir (VALTREX) 1000 MG tablet Take 2 tablets (2,000 mg total) by mouth 2 (two) times daily. For 1 day at first sign of outbreak 8 tablet 2 More than a month at Unknown time   Review of Systems  All systems reviewed and negative except as stated in HPI  Blood pressure (!) 110/57, pulse 87, temperature 98 F (36.7 C), temperature source Oral, resp. rate 18, height '5\' 5"'  (1.651 m), weight 232 lb (105.2 kg), last menstrual period 09/07/2017, SpO2 100 %. General appearance: alert, cooperative, appears stated age, no distress and moderately obese Lungs: clear to auscultation bilaterally Heart: regular rate and  rhythm Abdomen: soft, non-tender; bowel sounds normal Extremities: Homans sign is negative, no sign of DVT Presentation: cephalic   Fetal monitoring: Baseline 160bpm, moderate variability. 2 late decels and an 8 min bradycardic episode to 70s during interview. Applied oxygen to mother and started IVF bolus with subsequent return to baseline and induced acceleration with fetal stimulation. Continues with good variability though without accels or decels. Uterine activity: every 4-7 min  Dilation: 1.5 Effacement (%): 50 Station: Ballotable Exam by:: Jeanette Caprice, RN   Prenatal labs: ABO, Rh: O/Positive/-- (12/19 1538) Antibody: Negative (12/19 1538) Rubella: 13.60 (12/19 1538) RPR: Non Reactive (03/25 1022)  HBsAg: Negative (12/19 1538)  HIV: Non Reactive (03/25 1022)  GBS: Negative (06/04 1633)  2 hr Glucola 83, 93, 101 Genetic screening  CF neg, Quad neg Anatomy US @ [redacted]w[redacted]d normal anatomy, female fetus  Prenatal Transfer Tool  Maternal Diabetes: No Genetic Screening: Normal Maternal Ultrasounds/Referrals: Normal Fetal Ultrasounds or other Referrals:  None Maternal Substance Abuse:  No Significant Maternal Medications:  None Significant Maternal Lab Results: None  No results found for this or any previous visit (from the past 24 hour(s)).  Patient Active Problem List   Diagnosis Date Noted  . Obesity affecting pregnancy, antepartum 12/02/2017  . Supervision of normal first pregnancy, antepartum 11/24/2017  . Family history of breast cancer in mother 01/31/2016  . Vitamin D deficiency 11/29/2013  . Anemia 11/29/2013  . Breast lump in female 11/29/2013    Assessment/Plan:  NTifini Reederis a 32y.o. G1P0 at 421w5dere for SOL.   #Labor: Expectant Management. Continue to monitor closely for fetal tolerance of labor. #Pain: Epidural upon request #FWB: Cat II, continue to monitor #ID:  GBS negative #MOF: breast #MOC:OCPs #Circ:  Yes (in) #Elevated BP: Noted on  initial presentation in the MAU 157/76. All subsequent wnl. Continue to monitor.  AlRory PercyDO  06/10/2018, 4:00 AM   I have spoken with and examined this patient and agree with resident/PA-S/Med-S/SNM's note and plan of care. VSS, HRR&R, Resp unlabored, Legs neg. D/T FHR decels will admit w/expectant mgt at this point.  FrNigel BertholdCNM 06/10/2018 8:07 AM

## 2018-06-10 NOTE — Progress Notes (Signed)
Patient ID: Deborah White, female   DOB: 03/07/1986, 32 y.o.   MRN: [id number]   Deborah presentation confirmed by bedside ultrasound performed by Judeth HornErin Lawrence, Deborah White  Deborah White, CNM 06/10/18  10:22 AM

## 2018-06-10 NOTE — Anesthesia Procedure Notes (Signed)
Epidural Patient location during procedure: OB Start time: 06/10/2018 1:53 PM End time: 06/10/2018 2:12 PM  Staffing Anesthesiologist: Lowella CurbMiller, Tamya Denardo Ray, MD Performed: anesthesiologist   Preanesthetic Checklist Completed: patient identified, site marked, surgical consent, pre-op evaluation, timeout performed, IV checked, risks and benefits discussed and monitors and equipment checked  Epidural Patient position: sitting Prep: ChloraPrep Patient monitoring: heart rate, cardiac monitor, continuous pulse ox and blood pressure Approach: midline Location: L2-L3 Injection technique: LOR saline  Needle:  Needle type: Tuohy  Needle gauge: 17 G Needle length: 9 cm Needle insertion depth: 7 cm Catheter type: closed end flexible Catheter size: 20 Guage Catheter at skin depth: 11 cm Test dose: negative  Assessment Events: blood not aspirated, injection not painful, no injection resistance, negative IV test and no paresthesia  Additional Notes Reason for block:procedure for pain

## 2018-06-10 NOTE — Progress Notes (Signed)
Labor Progress Note Jannifer Franklinibras Abdulhafez is a 32 y.o. G1P0 at 5686w5d presented for SOL.  S: Comfortable with epidural.   O:  BP (!) 127/52   Pulse 93   Temp 98.7 F (37.1 C) (Axillary)   Resp 18   Ht 5\' 5"  (1.651 m)   Wt 232 lb (105.2 kg)   LMP 09/07/2017   SpO2 100%   BMI 38.61 kg/m   UJW:JXBJYNWGFHT:baseline rate 155, moderate variability, no acels, variable and few late decels Toco: ctx every 3-5 min  CVE: 5/60/-3  A&P: 32 y.o. G1P0 8886w5d here for SOL. Labor: Progressing slowly. Currently on 5 of Pit, titrating slowly pending fetal tolerance. IUPC in place with amnioinfusion at 13350ml/hr. Pain: Epidural FWB: Cat II, continue to monitor. GBS negative  Ellwood DenseAlison Rumball, DO 9:09 PM

## 2018-06-10 NOTE — MAU Note (Signed)
Pt reports contractions every 5 mins. Pt denies LOF or vaginal bleeding. Reports good fetal movement. Cervix was closed 2 weeks ago.

## 2018-06-10 NOTE — Progress Notes (Signed)
At bedside to attempt AROM, IUPC placement, and amnioinfusion.  Onset of fetal deceleration with minimal contact with cervix. Patient unable to maintain lithotomy due to discomfort. Repositioned to left lateral, right lateral, IV fluid bolus initiated, oxygen applied. FHT remained upper 60s to low 90s. Patient repositioned to hands and knees. Dr. Earlene Plateravis called to bedside, arrived within two minutes to evaluate patient. FHT 118 as Dr. Earlene Plateravis arrived and returned to baseline as patient maintained hands and knees.   Plan to maintain hands and knees for ten minutes, reposition to left lateral. If strip remains Cat I, give Fentanyl IV, reattempt AROM and amnioinfusion.  Deborah BiblesSamantha White, CNM 06/10/18 12:30 PM

## 2018-06-10 NOTE — Progress Notes (Signed)
Pt informed that the ultrasound is considered a limited OB ultrasound and is not intended to be a complete ultrasound exam.  Patient also informed that the ultrasound is not being completed with the intent of assessing for fetal or placental anomalies or any pelvic abnormalities.  Explained that the purpose of today's ultrasound is to assess for  presentation.  Patient acknowledges the purpose of the exam and the limitations of the study.  Vertex   Shankar Silber, NP    

## 2018-06-10 NOTE — Anesthesia Pain Management Evaluation Note (Signed)
  CRNA Pain Management Visit Note  Patient: Deborah White, 32 y.o., female  "Hello I am a [facility] of the anesthesia [facility]. We have an anesthesia team available at all times to provide care throughout the hospital, including epidural management and anesthesia for C-section. I don't know your plan for the delivery whether it a natural birth, water birth, IV sedation, nitrous supplementation, doula or epidural, but we want to meet your pain goals."   1.Was your pain managed to your expectations on prior hospitalizations?   No prior hospitalizations  2.What is your expectation for pain management during this hospitalization?     Labor support without medications  3.How can we help you reach that goal? Be available;educated about epidural and questions answered  Record the patient's initial score and the patient's pain goal.   Pain: 7  Pain Goal: 10 [facility] wants you to be able to say your pain was always managed very well.  Edison PaceWILKERSON,Kristofer Schaffert 06/10/2018

## 2018-06-11 ENCOUNTER — Encounter (HOSPITAL_COMMUNITY): Payer: Self-pay

## 2018-06-11 ENCOUNTER — Encounter (HOSPITAL_COMMUNITY)
Admission: AD | Disposition: A | Discharge status: Home / Self Care | Payer: Self-pay | Source: Home / Self Care | Attending: Family Medicine

## 2018-06-11 DIAGNOSIS — O480 Post-term pregnancy: Secondary | ICD-10-CM

## 2018-06-11 DIAGNOSIS — Z3A40 40 weeks gestation of pregnancy: Secondary | ICD-10-CM

## 2018-06-11 LAB — CBC
HCT: 33.7 % — ABNORMAL LOW (ref 36.0–46.0)
Hemoglobin: 11.5 g/dL — ABNORMAL LOW (ref 12.0–15.0)
MCH: 29.3 pg (ref 26.0–34.0)
MCHC: 34.1 g/dL (ref 30.0–36.0)
MCV: 85.8 fL (ref 78.0–100.0)
Platelets: 194 10*3/uL (ref 150–400)
RBC: 3.93 MIL/uL (ref 3.87–5.11)
RDW: 13.9 % (ref 11.5–15.5)
WBC: 19.9 10*3/uL — ABNORMAL HIGH (ref 4.0–10.5)

## 2018-06-11 LAB — CREATININE, SERUM
Creatinine, Ser: 0.5 mg/dL (ref 0.44–1.00)
GFR calc Af Amer: >60 mL/min (ref >60)
GFR calc non Af Amer: >60 mL/min (ref >60)

## 2018-06-11 LAB — RPR: RPR Ser Ql: NONREACTIVE

## 2018-06-11 SURGERY — Surgical Case
Anesthesia: Epidural

## 2018-06-11 MED ORDER — SODIUM CHLORIDE 0.9 % IR SOLN
Status: DC | PRN
  Administered 2018-06-11: 1000 mL

## 2018-06-11 MED ORDER — LIDOCAINE-EPINEPHRINE (PF) 2 %-1:200000 IJ SOLN
INTRAMUSCULAR | Status: DC | PRN
  Administered 2018-06-11: 10 mL via EPIDURAL

## 2018-06-11 MED ORDER — MEPERIDINE HCL 25 MG/ML IJ SOLN
INTRAMUSCULAR | Status: DC | PRN
  Administered 2018-06-11 (×2): 12.5 mg via INTRAVENOUS

## 2018-06-11 MED ORDER — CEFAZOLIN SODIUM-DEXTROSE 2-4 GM/100ML-% IV SOLN
2.0000 g | INTRAVENOUS | Status: AC
  Administered 2018-06-11: 2 g via INTRAVENOUS

## 2018-06-11 MED ORDER — SCOPOLAMINE 1 MG/3DAYS TD PT72
MEDICATED_PATCH | TRANSDERMAL | Status: DC | PRN
  Administered 2018-06-11: 1 via TRANSDERMAL

## 2018-06-11 MED ORDER — NALBUPHINE HCL 10 MG/ML IJ SOLN: 5.0000 mg | Freq: Once | INTRAMUSCULAR | Status: DC | PRN

## 2018-06-11 MED ORDER — SIMETHICONE 80 MG PO CHEW
80.0000 mg | CHEWABLE_TABLET | ORAL | Status: DC
  Administered 2018-06-11 – 2018-06-13 (×2): 80 mg via ORAL
  Filled 2018-06-11 (×3): qty 1

## 2018-06-11 MED ORDER — LACTATED RINGERS IV SOLN
INTRAVENOUS | Status: DC | PRN
  Administered 2018-06-11: 01:00:00 via INTRAVENOUS

## 2018-06-11 MED ORDER — OXYCODONE HCL 5 MG PO TABS: 10.0000 mg | ORAL_TABLET | ORAL | Status: DC | PRN

## 2018-06-11 MED ORDER — MENTHOL 3 MG MT LOZG: 1.0000 | LOZENGE | OROMUCOSAL | Status: DC | PRN

## 2018-06-11 MED ORDER — BUPIVACAINE HCL (PF) 0.25 % IJ SOLN
INTRAMUSCULAR | Status: DC | PRN
  Administered 2018-06-11: 30 mL

## 2018-06-11 MED ORDER — OXYTOCIN 10 UNIT/ML IJ SOLN
INTRAMUSCULAR | Status: AC
  Filled 2018-06-11: qty 4

## 2018-06-11 MED ORDER — NALBUPHINE HCL 10 MG/ML IJ SOLN: 5.0000 mg | INTRAMUSCULAR | Status: DC | PRN

## 2018-06-11 MED ORDER — KETOROLAC TROMETHAMINE 30 MG/ML IJ SOLN: 30.0000 mg | Freq: Four times a day (QID) | INTRAMUSCULAR | Status: AC | PRN

## 2018-06-11 MED ORDER — TETANUS-DIPHTH-ACELL PERTUSSIS 5-2.5-18.5 LF-MCG/0.5 IM SUSP: 0.5000 mL | Freq: Once | INTRAMUSCULAR | Status: DC

## 2018-06-11 MED ORDER — WITCH HAZEL-GLYCERIN EX PADS: 1.0000 "application " | MEDICATED_PAD | CUTANEOUS | Status: DC | PRN

## 2018-06-11 MED ORDER — ONDANSETRON HCL 4 MG/2ML IJ SOLN
INTRAMUSCULAR | Status: DC | PRN
  Administered 2018-06-11: 4 mg via INTRAVENOUS

## 2018-06-11 MED ORDER — COCONUT OIL OIL
1.0000 "application " | TOPICAL_OIL | Status: DC | PRN
  Administered 2018-06-13: 1 via TOPICAL
  Filled 2018-06-11: qty 120

## 2018-06-11 MED ORDER — ENOXAPARIN SODIUM 40 MG/0.4ML ~~LOC~~ SOLN
40.0000 mg | SUBCUTANEOUS | Status: DC
  Administered 2018-06-11 – 2018-06-13 (×3): 40 mg via SUBCUTANEOUS
  Filled 2018-06-11 (×3): qty 0.4

## 2018-06-11 MED ORDER — MORPHINE SULFATE (PF) 0.5 MG/ML IJ SOLN
INTRAMUSCULAR | Status: DC | PRN
  Administered 2018-06-11: 4 mg via EPIDURAL

## 2018-06-11 MED ORDER — MEASLES, MUMPS & RUBELLA VAC ~~LOC~~ INJ
0.5000 mL | INJECTION | Freq: Once | SUBCUTANEOUS | Status: DC
  Filled 2018-06-11: qty 0.5

## 2018-06-11 MED ORDER — DIPHENHYDRAMINE HCL 25 MG PO CAPS: 25.0000 mg | ORAL_CAPSULE | ORAL | Status: DC | PRN

## 2018-06-11 MED ORDER — LACTATED RINGERS IV SOLN
INTRAVENOUS | Status: DC | PRN
  Administered 2018-06-11: 40 [IU] via INTRAVENOUS

## 2018-06-11 MED ORDER — DIPHENHYDRAMINE HCL 50 MG/ML IJ SOLN: 12.5000 mg | INTRAMUSCULAR | Status: DC | PRN

## 2018-06-11 MED ORDER — MEPERIDINE HCL 25 MG/ML IJ SOLN: 6.2500 mg | INTRAMUSCULAR | Status: DC | PRN

## 2018-06-11 MED ORDER — NALOXONE HCL 0.4 MG/ML IJ SOLN: 0.4000 mg | INTRAMUSCULAR | Status: DC | PRN

## 2018-06-11 MED ORDER — DIBUCAINE 1 % RE OINT: 1.0000 "application " | TOPICAL_OINTMENT | RECTAL | Status: DC | PRN

## 2018-06-11 MED ORDER — ACETAMINOPHEN 325 MG PO TABS
650.0000 mg | ORAL_TABLET | ORAL | Status: DC | PRN
  Administered 2018-06-12: 650 mg via ORAL
  Filled 2018-06-11: qty 2

## 2018-06-11 MED ORDER — MORPHINE SULFATE (PF) 0.5 MG/ML IJ SOLN
INTRAMUSCULAR | Status: AC
  Filled 2018-06-11: qty 10

## 2018-06-11 MED ORDER — IBUPROFEN 600 MG PO TABS
600.0000 mg | ORAL_TABLET | Freq: Four times a day (QID) | ORAL | Status: DC
  Administered 2018-06-11 – 2018-06-14 (×13): 600 mg via ORAL
  Filled 2018-06-11 (×13): qty 1

## 2018-06-11 MED ORDER — KETOROLAC TROMETHAMINE 30 MG/ML IJ SOLN
INTRAMUSCULAR | Status: AC
  Administered 2018-06-11: 30 mg via INTRAVENOUS
  Filled 2018-06-11: qty 1

## 2018-06-11 MED ORDER — OXYCODONE HCL 5 MG PO TABS: 5.0000 mg | ORAL_TABLET | ORAL | Status: DC | PRN

## 2018-06-11 MED ORDER — SENNOSIDES-DOCUSATE SODIUM 8.6-50 MG PO TABS
2.0000 | ORAL_TABLET | ORAL | Status: DC
  Administered 2018-06-11 – 2018-06-13 (×3): 2 via ORAL
  Filled 2018-06-11 (×3): qty 2

## 2018-06-11 MED ORDER — DEXAMETHASONE SODIUM PHOSPHATE 10 MG/ML IJ SOLN
INTRAMUSCULAR | Status: DC | PRN
  Administered 2018-06-11: 10 mg via INTRAVENOUS

## 2018-06-11 MED ORDER — SCOPOLAMINE 1 MG/3DAYS TD PT72
1.0000 | MEDICATED_PATCH | Freq: Once | TRANSDERMAL | Status: DC
  Filled 2018-06-11: qty 1

## 2018-06-11 MED ORDER — KETOROLAC TROMETHAMINE 30 MG/ML IJ SOLN
30.0000 mg | Freq: Four times a day (QID) | INTRAMUSCULAR | Status: AC | PRN
  Administered 2018-06-11: 30 mg via INTRAVENOUS

## 2018-06-11 MED ORDER — MEPERIDINE HCL 25 MG/ML IJ SOLN
INTRAMUSCULAR | Status: AC
  Filled 2018-06-11: qty 1

## 2018-06-11 MED ORDER — ZOLPIDEM TARTRATE 5 MG PO TABS: 5.0000 mg | ORAL_TABLET | Freq: Every evening | ORAL | Status: DC | PRN

## 2018-06-11 MED ORDER — PRENATAL MULTIVITAMIN CH
1.0000 | ORAL_TABLET | Freq: Every day | ORAL | Status: DC
  Administered 2018-06-11 – 2018-06-14 (×4): 1 via ORAL
  Filled 2018-06-11 (×4): qty 1

## 2018-06-11 MED ORDER — OXYCODONE HCL 5 MG PO TABS: 5.0000 mg | ORAL_TABLET | Freq: Once | ORAL | Status: DC | PRN

## 2018-06-11 MED ORDER — SCOPOLAMINE 1 MG/3DAYS TD PT72
MEDICATED_PATCH | TRANSDERMAL | Status: AC
  Filled 2018-06-11: qty 1

## 2018-06-11 MED ORDER — BUPIVACAINE HCL (PF) 0.25 % IJ SOLN
INTRAMUSCULAR | Status: AC
  Filled 2018-06-11: qty 90

## 2018-06-11 MED ORDER — DEXAMETHASONE SODIUM PHOSPHATE 10 MG/ML IJ SOLN
INTRAMUSCULAR | Status: AC
  Filled 2018-06-11: qty 1

## 2018-06-11 MED ORDER — PHENYLEPHRINE 40 MCG/ML (10ML) SYRINGE FOR IV PUSH (FOR BLOOD PRESSURE SUPPORT)
PREFILLED_SYRINGE | INTRAVENOUS | Status: AC
  Filled 2018-06-11: qty 10

## 2018-06-11 MED ORDER — ONDANSETRON HCL 4 MG/2ML IJ SOLN: 4.0000 mg | Freq: Three times a day (TID) | INTRAMUSCULAR | Status: DC | PRN

## 2018-06-11 MED ORDER — NALOXONE HCL 4 MG/10ML IJ SOLN
1.0000 ug/kg/h | INTRAVENOUS | Status: DC | PRN
  Filled 2018-06-11: qty 5

## 2018-06-11 MED ORDER — SODIUM CHLORIDE 0.9% FLUSH: 3.0000 mL | INTRAVENOUS | Status: DC | PRN

## 2018-06-11 MED ORDER — SIMETHICONE 80 MG PO CHEW
80.0000 mg | CHEWABLE_TABLET | Freq: Three times a day (TID) | ORAL | Status: DC
Start: 2018-06-11 — End: 2018-06-14
  Administered 2018-06-11 – 2018-06-14 (×12): 80 mg via ORAL
  Filled 2018-06-11 (×13): qty 1

## 2018-06-11 MED ORDER — DEXTROSE IN LACTATED RINGERS 5 % IV SOLN
INTRAVENOUS | Status: DC
  Administered 2018-06-11: 14:00:00 via INTRAVENOUS

## 2018-06-11 MED ORDER — OXYCODONE HCL 5 MG/5ML PO SOLN: 5.0000 mg | Freq: Once | ORAL | Status: DC | PRN

## 2018-06-11 MED ORDER — OXYTOCIN 40 UNITS IN LACTATED RINGERS INFUSION - SIMPLE MED: 2.5000 [IU]/h | INTRAVENOUS | Status: AC

## 2018-06-11 MED ORDER — DIPHENHYDRAMINE HCL 25 MG PO CAPS: 25.0000 mg | ORAL_CAPSULE | Freq: Four times a day (QID) | ORAL | Status: DC | PRN

## 2018-06-11 MED ORDER — SODIUM CHLORIDE 0.9 % IV SOLN
500.0000 mg | Freq: Once | INTRAVENOUS | Status: AC
  Administered 2018-06-11: 500 mg via INTRAVENOUS
  Filled 2018-06-11: qty 500

## 2018-06-11 MED ORDER — ONDANSETRON HCL 4 MG/2ML IJ SOLN: 4.0000 mg | Freq: Four times a day (QID) | INTRAMUSCULAR | Status: DC | PRN

## 2018-06-11 MED ORDER — CEFAZOLIN SODIUM-DEXTROSE 2-4 GM/100ML-% IV SOLN
INTRAVENOUS | Status: AC
  Filled 2018-06-11: qty 100

## 2018-06-11 MED ORDER — ONDANSETRON HCL 4 MG/2ML IJ SOLN
INTRAMUSCULAR | Status: AC
  Filled 2018-06-11: qty 2

## 2018-06-11 MED ORDER — PHENYLEPHRINE HCL 10 MG/ML IJ SOLN
INTRAMUSCULAR | Status: DC | PRN
  Administered 2018-06-11: 120 ug via INTRAVENOUS

## 2018-06-11 MED ORDER — SIMETHICONE 80 MG PO CHEW: 80.0000 mg | CHEWABLE_TABLET | ORAL | Status: DC | PRN

## 2018-06-11 MED ORDER — FENTANYL CITRATE (PF) 100 MCG/2ML IJ SOLN: 25.0000 ug | INTRAMUSCULAR | Status: DC | PRN

## 2018-06-11 SURGICAL SUPPLY — 32 items
BENZOIN TINCTURE PRP APPL 2/3 (GAUZE/BANDAGES/DRESSINGS) ×2 IMPLANT
CHLORAPREP W/TINT 26ML (MISCELLANEOUS) ×2 IMPLANT
CLAMP CORD UMBIL (MISCELLANEOUS) IMPLANT
CLOTH BEACON ORANGE TIMEOUT ST (SAFETY) ×2 IMPLANT
CLSR STERI-STRIP ANTIMIC 1/2X4 (GAUZE/BANDAGES/DRESSINGS) ×2 IMPLANT
DRSG OPSITE POSTOP 4X10 (GAUZE/BANDAGES/DRESSINGS) ×2 IMPLANT
ELECT REM PT RETURN 9FT ADLT (ELECTROSURGICAL) ×2
ELECTRODE REM PT RTRN 9FT ADLT (ELECTROSURGICAL) ×1 IMPLANT
EXTRACTOR VACUUM M CUP 4 TUBE (SUCTIONS) IMPLANT
GLOVE BIOGEL PI IND STRL 7.0 (GLOVE) ×2 IMPLANT
GLOVE BIOGEL PI INDICATOR 7.0 (GLOVE) ×2
GLOVE ECLIPSE 7.0 STRL STRAW (GLOVE) ×4 IMPLANT
GOWN STRL REUS W/TWL LRG LVL3 (GOWN DISPOSABLE) ×4 IMPLANT
HOVERMATT SINGLE USE (MISCELLANEOUS) ×2 IMPLANT
KIT ABG SYR 3ML LUER SLIP (SYRINGE) ×2 IMPLANT
NEEDLE HYPO 22GX1.5 SAFETY (NEEDLE) ×2 IMPLANT
NEEDLE HYPO 25X5/8 SAFETYGLIDE (NEEDLE) ×2 IMPLANT
NS IRRIG 1000ML POUR BTL (IV SOLUTION) ×2 IMPLANT
PACK C SECTION WH (CUSTOM PROCEDURE TRAY) ×2 IMPLANT
PAD ABD 7.5X8 STRL (GAUZE/BANDAGES/DRESSINGS) ×2 IMPLANT
PAD OB MATERNITY 4.3X12.25 (PERSONAL CARE ITEMS) ×2 IMPLANT
PENCIL SMOKE EVAC W/HOLSTER (ELECTROSURGICAL) ×2 IMPLANT
RTRCTR C-SECT PINK 25CM LRG (MISCELLANEOUS) ×2 IMPLANT
SPONGE GAUZE 4X4 12PLY STER LF (GAUZE/BANDAGES/DRESSINGS) ×4 IMPLANT
STRIP CLOSURE SKIN 1/2X4 (GAUZE/BANDAGES/DRESSINGS) ×2 IMPLANT
SUT VIC AB 0 CTX 36 (SUTURE) ×3
SUT VIC AB 0 CTX36XBRD ANBCTRL (SUTURE) ×3 IMPLANT
SUT VIC AB 4-0 KS 27 (SUTURE) ×2 IMPLANT
SYR 30ML LL (SYRINGE) ×2 IMPLANT
TAPE CLOTH SURG 4X10 WHT LF (GAUZE/BANDAGES/DRESSINGS) ×2 IMPLANT
TOWEL OR 17X24 6PK STRL BLUE (TOWEL DISPOSABLE) ×2 IMPLANT
TRAY FOLEY W/BAG SLVR 14FR LF (SET/KITS/TRAYS/PACK) ×2 IMPLANT

## 2018-06-11 NOTE — Addendum Note (Signed)
Addendum  created 06/11/18 0739 by Shanon PayorGregory, Kamaiyah Uselton M, CRNA   Sign clinical note

## 2018-06-11 NOTE — Anesthesia Postprocedure Evaluation (Signed)
Anesthesia Post Note  Patient: Julio Abdulhafez  Procedure(s) Performed: CESAREAN SECTION (N/A )     Patient location during evaluation: Mother Baby Anesthesia Type: Epidural Level of consciousness: awake and alert Pain management: pain level controlled Vital Signs Assessment: post-procedure vital signs reviewed and stable Respiratory status: spontaneous breathing, nonlabored ventilation and respiratory function stable Cardiovascular status: stable Postop Assessment: no headache, no backache and epidural receding Anesthetic complications: no    Last Vitals:  Vitals:   06/11/18 0541 06/11/18 0625  BP: (!) 110/35 (!) 109/44  Pulse: 88 86  Resp: 18 18  Temp: 36.9 C 36.8 C  SpO2: 96% 97%    Last Pain:  Vitals:   06/11/18 0625  TempSrc: Oral  PainSc: 0-No pain   Pain Goal:                 Lowella CurbWarren Ray Ardythe Klute

## 2018-06-11 NOTE — Consult Note (Signed)
Neonatology Note:   Attendance at C-section:    I was asked by Dr. Pratt to attend this primary C/S at term for fetal distress and FTP. The mother is a G1, GBS neg with good prenatal care. ROM 12 hours before delivery, fluid meconium. Infant vigorous with good spontaneous cry and tone. +60 sec DCC.  Needed only minimal bulb suctioning. Ap 8/9. Lungs clear to ausc in DR. To CN to care of Pediatrician.  Deborah White C. Cinde Ebert, MD 

## 2018-06-11 NOTE — Progress Notes (Signed)
Labor Progress Note Deborah White is a 32 y.o. G1P0 at 5385w6d presented for early labor and FHR decel  S:  Comfortable with epidural, no c/o.  O:  BP (!) 122/58   Pulse 95   Temp 99.1 F (37.3 C) (Axillary)   Resp 18   Ht 5\' 5"  (1.651 m)   Wt 232 lb (105.2 kg)   LMP 09/07/2017   SpO2 99%   BMI 38.61 kg/m  EFM: baseline 165 bpm/ mod variability/ no accels/ variable, late decels  Toco: 3-4 SVE: 5/60/-3 Pitocin: 6 mu/min MVU: 200  A/P: 32 y.o. G1P0 2885w6d  1. Labor: latent 2. FWB: Cat II 3. Pain: epidural  No cervical change, labor still latent, although MVU adequate. Decels likely cord compression, variability remains moderate, continue maternal repositioning and amnioinfusion. Will watch tracing closely. Continue Pitocin to fetal tolerance.  MOD unclear at this time. Dr. Shawnie PonsPratt updated, recommend continue current plan.  Donette LarryMelanie Jahniya Duzan, CNM 12:05 AM

## 2018-06-11 NOTE — Progress Notes (Signed)
Patient ID: Deborah White, female   DOB: 04/02/1986, 32 y.o.   MRN: [id number] Patient with worsening FHR. There is some loss of variability and repetitive late decels. SVE reveals no change in cervical dilation. There is no scalp stim and over the last 1 hour there has been worsening of the variability more so. Last spontaneous accel was at 23:50. Given how remote she is from delivery, will proceed with abdominal delivery.

## 2018-06-11 NOTE — Anesthesia Postprocedure Evaluation (Signed)
Anesthesia Post Note  Patient: Deborah White  Procedure(s) Performed: CESAREAN SECTION (N/A )     Patient location during evaluation: Mother Baby Anesthesia Type: Epidural Level of consciousness: awake and alert and oriented Pain management: pain level controlled Vital Signs Assessment: post-procedure vital signs reviewed and stable Respiratory status: spontaneous breathing and nonlabored ventilation Cardiovascular status: stable Postop Assessment: no headache, no backache, patient able to bend at knees, no signs of nausea or vomiting, adequate PO intake and epidural receding Anesthetic complications: no    Last Vitals:  Vitals:   06/11/18 0541 06/11/18 0625  BP: (!) 110/35 (!) 109/44  Pulse: 88 86  Resp: 18 18  Temp: 36.9 C 36.8 C  SpO2: 96% 97%    Last Pain:  Vitals:   06/11/18 0625  TempSrc: Oral  PainSc: 0-No pain   Pain Goal:                 [name]

## 2018-06-11 NOTE — Lactation Note (Signed)
This note was copied from a baby's chart. Lactation Consultation Note  Patient Name: Deborah White ZOXWR'UToday's Date: 06/11/2018 Reason for consult: Initial assessment;Primapara;1st time breastfeeding;Term  9617 hours old FT female who is being exclusively BF by his mother, she's a P1. [facility] but she doesn't have a pump at home at the moment. She said she'll be ordering one soon. LC offered a hand pump from the hospital and she agreed, pump instructions, cleaning and storage was reviewed; as well as milk storage guidelines.  Baby was awake on bassinet when entering the room, offered assistance with latch and mom agreed to have baby STS, LC took baby to the right breast in football position but he wasn't able to sustain the latch, he latched on briefly for a few second but then he came off. Mom requested to try cross cradle hold and baby did better with it, he'd suckle for a few seconds (slightly longer than with football hold) but unable to hear any swallows at this point, room was very noisy and full of visitors. Mom voiced she heard them earlier though in previous feedings; asked mom to call for latch assistance on next feeding.  Taught mom how to hand express and she was able to get 3 big drops of colostrum off the right breast, colostrum just kept flowing. Advised mom to pump after feeding and spoon feed baby any amount of colostrum she may get.  Encouraged mom to feed baby STS 8-12 times/24 hours or sooner if feeding cues are present. Reviewed BF brochure, BF resources and feeding diary, mom is aware of LC services and will call PRN.  Maternal Data Formula Feeding for Exclusion: No Has patient been taught Hand Expression?: Yes Does the patient have breastfeeding experience prior to this delivery?: No  Feeding Length of feed: 15 min  Interventions Interventions: Breast feeding basics reviewed;Assisted with latch;Skin to skin;Breast  massage;Hand express;Breast compression;Adjust position;Support pillows;Position options;Hand pump  Lactation Tools Discussed/Used Tools: Pump Breast pump type: Manual WIC Program: Yes Pump Review: Setup, frequency, and cleaning;Milk Storage Initiated by:: MPeck Date initiated:: 06/11/18   Consult Status Consult Status: Follow-up Date: 06/12/18 Follow-up type: In-patient    Linley Moskal [name] Allysia Ingles 06/11/2018, 7:00 PM

## 2018-06-11 NOTE — Op Note (Signed)
Preoperative Diagnosis:  IUP @ 5462w6d, Fetal intolerance of labor, Meconium stained fluid  Postoperative Diagnosis:  Same  Procedure: Primary low transverse cesarean section  Surgeon: Tinnie Gensanya Jatavis Malek, M.D.  Assistant: None  Findings: Viable female infant, APGAR (1 MIN): 8   APGAR (5 MINS): 9  Weight pending,  Vertex  presentation, LOA position Normal tubes and ovaries, nuchal cord x 1, wrapped around body, true knot in cord. Infant meconium stained  Estimated blood loss: 1000 cc  Complications: None known  Specimens: Placenta to pathology  Reason for procedure: Briefly, the patient is a 32 y.o. G1P0 962w6d who presents for eval of labor. Noted to have 8 minute decel. Subsequently underwent IOL, with continued prolonged variables. AROM + amnioinfusion begun and pitocin started with reassuring FHR due to spontaneous accels. Adequate labor x 4-5 hours, with little change in cervix. FHR changed to poor variability with persistent late decels. There was loss of spontaneous accels and no accels with scalp stimulation noted. With little cervical change, and remote from delivery, decision made to proceed with abdominal delivery. Risks include but are not limited to bleeding, infection, injury to surrounding structures, including bowel, bladder and ureters, blood clots, and death.  Likelihood of success is high.   Procedure: Patient is a to the OR where spinal analgesia was administered. She was then placed in a supine position with left lateral tilt. She received 2 g of Ancef and Zithromax and SCDs were in place. A timeout was performed. She was prepped and draped in the usual sterile fashion. A Foley catheter was present in the bladder. A knife was then used to make a Pfannenstiel incision. This incision was carried out to underlying fascia which was divided in the midline with the knife. The incision was extended laterally, sharply.  The rectus was divided in the midline.  The peritoneal cavity was entered  bluntly.  Alexis retractor was placed inside the incision.  A knife was used to make a low transverse incision on the uterus. This incision was carried down to the amniotic cavity was entered. Fetus was in LOA position and was brought up out of the incision without difficulty. Delayed cord clamping x 1 minute. Cord was clamped x 2 and cut. Infant handed to waiting nurse. Cord pH could not be obtained. Cord blood was obtained. Placenta was delivered from the uterus.  Uterus was cleaned with dry lap pads. Uterine incision closed with 0 Vicryl suture in a locked running fashion. A second layer of 0 Vicryl in an imbricating fashion was used to achieve hemostasis. Alexis retractor was removed from the abdomen. Peritoneal closure was done with 0 Vicryl suture.  Fascia is closed with 0 Vicryl suture in a running fashion. Subcutaneous tissue infused with 30cc 0.25% Marcaine. Skin closed using 3-0 Vicryl on a Keith needle.  Steri strips applied, followed by pressure dressing.  All instrument, needle and lap counts were correct x 2.  Patient was awake and taken to PACU stable.  Infant remained with mom in couplet care , stable.   Shelbie Proctoranya S PrattMD 06/11/2018 2:17 AM

## 2018-06-11 NOTE — Transfer of Care (Signed)
Immediate Anesthesia Transfer of Care Note  Patient: Deborah White  Procedure(s) Performed: CESAREAN SECTION (N/A )  Patient Location: PACU  Anesthesia Type:Epidural  Level of Consciousness: awake, alert  and oriented  Airway & Oxygen Therapy: Patient Spontanous Breathing  Post-op Assessment: Report given to RN and Post -op Vital signs reviewed and stable  Post vital signs: Reviewed and stable  Last Vitals:  Vitals Value Taken Time  BP 92/63 06/11/2018  2:12 AM  Temp    Pulse 88 06/11/2018  2:12 AM  Resp 21 06/11/2018  2:12 AM  SpO2 100 % 06/11/2018  2:12 AM    Last Pain:  Vitals:   06/10/18 2332  TempSrc:   PainSc: 4          Complications: No apparent anesthesia complications

## 2018-06-12 ENCOUNTER — Inpatient Hospital Stay (HOSPITAL_COMMUNITY): Admission: RE | Admit: 2018-06-12 | Payer: BLUE CROSS/BLUE SHIELD | Source: Ambulatory Visit

## 2018-06-12 LAB — CBC
HCT: 29.4 % — ABNORMAL LOW (ref 36.0–46.0)
Hemoglobin: 9.8 g/dL — ABNORMAL LOW (ref 12.0–15.0)
MCH: 28.9 pg (ref 26.0–34.0)
MCHC: 33.3 g/dL (ref 30.0–36.0)
MCV: 86.7 fL (ref 78.0–100.0)
Platelets: 183 10*3/uL (ref 150–400)
RBC: 3.39 MIL/uL — ABNORMAL LOW (ref 3.87–5.11)
RDW: 14.1 % (ref 11.5–15.5)
WBC: 14.6 10*3/uL — ABNORMAL HIGH (ref 4.0–10.5)

## 2018-06-12 NOTE — Lactation Note (Addendum)
This note was copied from a baby's chart. Lactation Consultation Note  Patient Name: Deborah White YNWGN'FToday's Date: 06/12/2018 Reason for consult: Follow-up assessment;1st time breastfeeding;Primapara;Term;Difficult latch  P1 mother whose infant is now 4343 hours old.  Mother states that he has not breastfed well on the right breast and she would like some assistance.  When I entered the room mother was using the DEBP and obtaining colostrum from the left breast.  RN had her pumping for stimulation. RN questioned flange size and I changed her from #27 to #30 flange size with more comfort.  Baby is awake and quiet so offered to assist with a latch and mother accepted.  Attempted to latch in the football hold on the right breast.  Baby had wide flanged lips and open mouth.  He latched well but only sucked a couple of times before stopping.  Repeated attempts were made on the right breast without any more than a few sucks before he released.  Tried the cross cradle hold with the same results. She had pumped 5 mls of EBM prior to my arrival so I showed her finger feeding with the curved tip syringe and he took that very well.  I then suggested mother try the cross cradle hold on the left breast and baby was able to latch and suck effectively.  Mother was concerned that he would not feed from the right breast but I reassured her and told her she can continue to pump after attempting to help increase milk supply and for stimulation.  Encouraged her to feed 8-12 times/24 hours or more if he shows feeding cues tonight.  Continue STS while awake, breast massage and hand expression.  Mother will save any EBM she obtains and feed back to baby.  Mother has no further questions/concerns at this time.  RN updated.   Maternal Data Formula Feeding for Exclusion: No Has patient been taught Hand Expression?: Yes Does the patient have breastfeeding experience prior to this delivery?: No  Feeding Feeding Type:  Breast Fed Length of feed: 10 min(still feeding when I left the room)  LATCH Score Latch: Grasps breast easily, tongue down, lips flanged, rhythmical sucking.  Audible Swallowing: A few with stimulation  Type of Nipple: Everted at rest and after stimulation  Comfort (Breast/Nipple): Soft / non-tender  Hold (Positioning): Assistance needed to correctly position infant at breast and maintain latch.  LATCH Score: 8  Interventions Interventions: Breast feeding basics reviewed;Assisted with latch;Skin to skin;Breast massage;Hand express;Position options;Support pillows;Adjust position;Breast compression;DEBP  Lactation Tools Discussed/Used     Consult Status Consult Status: Follow-up Date: 06/13/18 Follow-up type: In-patient    Deborah White R [name] 06/12/2018, 9:10 PM

## 2018-06-12 NOTE — Progress Notes (Addendum)
POSTPARTUM PROGRESS NOTE  Post Partum Day 1 Subjective:  Deborah White is a 32 y.o. G1P1001 6165w6d s/p pLTCS for NR FHT and recurrent late decelerations.  No acute events overnight.  Pt denies problems with ambulating, voiding or po intake.  She denies nausea or vomiting. Patient endorses minimal gas and most recent last BM Thursday. She is experiencing minimal pain from incision during movement. She denies any cramping or abdominal tenderness. Lochia Small.   Objective: Blood pressure (!) 104/58, pulse 74, temperature 98.2 F (36.8 C), temperature source Oral, resp. rate 18, height 5\' 5"  (1.651 m), weight 105.2 kg (232 lb), last menstrual period 09/07/2017, SpO2 99 %, unknown if currently breastfeeding.  Physical Exam:  General: alert, cooperative and no distress Lochia:normal flow Chest: no respiratory distress Heart:regular rate, distal pulses intact Abdomen: soft, nontender,  Uterine Fundus: firm, appropriately tender DVT Evaluation: No calf swelling or tenderness Extremities: mild bilateral pedal edema  Recent Labs    06/11/18 0420 06/12/18 0558  HGB 11.5* 9.8*  HCT 33.7* 29.4*    Assessment/Plan:  ASSESSMENT: Deborah White is a 32 y.o. G1P1001 3065w6d s/p pLCS for NR FHT  Breastfeeding Circumcision prior to discharge Contraception: OCPs Patient will have follow up CBC drawn   LOS: 2 days   Candie MileLindsey R Mitchell 06/12/2018, 7:36 AM  I examined this pt and agree with the above assessment

## 2018-06-12 NOTE — Progress Notes (Signed)
Subjective: Postpartum Day 1: Cesarean Delivery Patient reports incisional pain, tolerating PO, + flatus and no problems voiding.    Objective: Vital signs in last 24 hours: Temp:  [98.2 F (36.8 C)-98.3 F (36.8 C)] 98.2 F (36.8 C) (07/07 0400) Pulse Rate:  [74-84] 74 (07/07 0400) Resp:  [18-20] 18 (07/07 0400) BP: (104-111)/(58-62) 104/58 (07/07 0400) SpO2:  [99 %] 99 % (07/06 1945)  Physical Exam:  General: alert, cooperative, appears stated age and no distress Lochia: appropriate Uterine Fundus: firm Incision: healing well, no significant drainage, no dehiscence, no significant erythema DVT Evaluation: No evidence of DVT seen on physical exam.  Recent Labs    06/11/18 0420 06/12/18 0558  HGB 11.5* 9.8*  HCT 33.7* 29.4*    Assessment/Plan: Status post Cesarean section. Doing well postoperatively.  Continue current care.  Deborah White 06/12/2018, 6:10 PM

## 2018-06-13 NOTE — Progress Notes (Signed)
Subjective: Postpartum Day 2: Cesarean Delivery Patient reports incisional pain, + BM and no problems voiding. Pain occurs when moving but not at rest.   Objective: Vital signs in last 24 hours: Temp:  [98.2 F (36.8 C)-98.3 F (36.8 C)] 98.2 F (36.8 C) (07/08 0622) Pulse Rate:  [67-76] 67 (07/08 0622) Resp:  [16-18] 16 (07/08 0622) BP: (110-116)/(65-72) 116/65 (07/08 62130622)  Physical Exam:  General: alert, cooperative and no distress Lochia: appropriate Uterine Fundus: firm Abdomen: tender to light palpation diffusely Incision: healing well, no significant erythema DVT Evaluation: No evidence of DVT seen on physical exam.  Recent Labs    06/11/18 0420 06/12/18 0558  HGB 11.5* 9.8*  HCT 33.7* 29.4*    Assessment/Plan: Status post Cesarean section. Doing well postoperatively.  Continue current care. Boy (inpatient circ)/breast feed/OCP D/C tomorrow  Sandre KittyDaniel K Olson 06/13/2018, 9:47 AM

## 2018-06-13 NOTE — Lactation Note (Signed)
This note was copied from a baby's chart. Lactation Consultation Note  Patient Name: Deborah White's Date: 06/13/2018 Reason for consult: Follow-up assessment;Primapara;1st time breastfeeding;Term;Infant weight loss;Hyperbilirubinemia  4364 hours old FT female who is being exclusively BF by his mother, she's a P1. Mom was set up with a DEBP yesterday but she hasn't been pumping at all, baby was put on double phototherapy today. Mom was already nursing baby on cross cradle position when entering the room, asked mom if she has heard any swallows, she said yes, when LC started doing breast compressions to assess the latch, noticed that infant was sucking on/off, mom kept having a somehow shallow latch, instructed her to get baby's nose really close to the breast, mom did her best efforts but it was challenging given the fact that baby was wrapped up with his blue blanket.  Explained to parents the importance of consistent pumping, and that also baby is at 5% weight loss, both voiced understanding and mom said she's willing to start pumping again. LC reconnected DEBP to mom's side of the bed and instructed her to pump every 3 hours and at least once at night. Asked her RN for some coconut oil; mom will use it prior pumping.  Encouraged mom to keep up with the good work and congratulated her for her efforts of providing breastmilk for her baby. Mom will finger feed or spoon feed baby any amount of EBM she may get. Formula supplementation was also discussed and reassured parents that will do it if it's medically necessary. They're both aware of LC services and will call PRN.  Maternal Data    Feeding Feeding Type: Breast Fed Length of feed: 10 min(baby still nursing when exiting the room)  LATCH Score Latch: Grasps breast easily, tongue down, lips flanged, rhythmical sucking.  Audible Swallowing: None  Type of Nipple: Everted at rest and after stimulation  Comfort (Breast/Nipple): Soft /  non-tender  Hold (Positioning): No assistance needed to correctly position infant at breast.  LATCH Score: 8  Interventions Interventions: Breast feeding basics reviewed;Skin to skin;Assisted with latch;Breast massage;Breast compression;Hand express;Coconut oil;Support pillows;DEBP  Lactation Tools Discussed/Used Tools: Pump Breast pump type: Double-Electric Breast Pump   Consult Status Consult Status: Follow-up Date: 06/14/18 Follow-up type: In-patient    [name] [name] [name] 06/13/2018, 5:30 PM

## 2018-06-14 MED ORDER — IBUPROFEN 600 MG PO TABS: 600.0000 mg | ORAL_TABLET | Freq: Four times a day (QID) | ORAL | 0 refills | Status: DC

## 2018-06-14 MED ORDER — DOCUSATE SODIUM 50 MG PO CAPS: 50.0000 mg | ORAL_CAPSULE | Freq: Two times a day (BID) | ORAL | 0 refills | Status: DC | PRN

## 2018-06-14 MED ORDER — OXYCODONE-ACETAMINOPHEN 5-325 MG PO TABS: 1.0000 | ORAL_TABLET | Freq: Four times a day (QID) | ORAL | 0 refills | Status: DC | PRN

## 2018-06-14 NOTE — Lactation Note (Signed)
This note was copied from a baby's chart. Lactation Consultation Note  Patient Name: Deborah White OZHYQ'MToday's Date: 06/14/2018   Baby 82 hours and mother trying to latch. Suggest mother support and compress breast for increased depth. Mother is post pumping more than 30 ml+ with DEBP and giving it back to baby. Mother does not have her personal DEBP yet but has manual. Discussed renting from [employer] if interested. Mom encouraged to feed baby 8-12 times/24 hours and with feeding cues.  Mom made aware of O/P services, breastfeeding support groups, community resources, and our phone # for post-discharge questions.       Maternal Data    Feeding Feeding Type: Bottle Fed - Breast Milk Length of feed: 15 min  LATCH Score                   Interventions    Lactation Tools Discussed/Used     Consult Status      [name], [name] 06/14/2018, 12:18 PM

## 2018-06-14 NOTE — Discharge Instructions (Signed)

## 2018-06-14 NOTE — Discharge Summary (Addendum)
OB Discharge Summary    Patient Name: Deborah White DOB: 03/30/1986 MRN: [id number]  Date of admission: 06/10/2018 Delivering MD: Reva BoresPRATT, TANYA S   Date of discharge: 06/14/2018  Admitting diagnosis: 40.5WKS CTX Intrauterine pregnancy: 3288w6d     Secondary diagnosis:  Active Problems:   Obesity affecting pregnancy, antepartum   Pregnancy  Additional problems: N/A     Discharge diagnosis: Term Pregnancy Delivered                                                                                                Post partum procedures:None  Augmentation: None  Complications: None  Hospital course:  Induction of Labor With Cesarean Section  32 y.o. yo G1P1001 at 5088w6d was admitted to the hospital 06/10/2018 for induction of labor. Patient had a labor course significant for fetal intolerance of labor and recurrent lates, otherwise progressed well. The patient went for cesarean section due to Non-Reassuring FHR, and delivered a Viable infant,06/11/2018  Membrane Rupture Time/Date: 12:48 PM ,06/10/2018   Details of operation can be found in separate operative Note.  Patient had a postpartum course notable for one isolated elevated BP reading. She will have a BP check in one week postpartum. She is ambulating, tolerating a regular diet, passing flatus, and urinating well.  Patient is discharged home in stable condition on 06/14/18.                                   Physical exam  Vitals:   06/13/18 0622 06/13/18 1615 06/13/18 2302 06/14/18 0542  BP: 116/65 124/69 140/74 134/81  Pulse: 67 75 81 74  Resp: 16 18 18 20   Temp: 98.2 F (36.8 C) 98.4 F (36.9 C) 98.3 F (36.8 C) 97.9 F (36.6 C)  TempSrc: Oral Oral Oral Oral  SpO2:      Weight:      Height:       General: alert, cooperative and no distress Lochia: appropriate Uterine Fundus: firm Incision: Dressing is clean, dry, and intact with minimal saturation DVT Evaluation: 1+ pitting edema seen on exam although negative Homan's, no  erythema or warmth. No evidence of DVT seen on physical exam. Labs: Lab Results  Component Value Date   WBC 14.6 (H) 06/12/2018   HGB 9.8 (L) 06/12/2018   HCT 29.4 (L) 06/12/2018   MCV 86.7 06/12/2018   PLT 183 06/12/2018   CMP Latest Ref Rng & Units 06/11/2018  Glucose 65 - 99 mg/dL -  BUN 6 - 20 mg/dL -  Creatinine 7.250.44 - 3.661.00 mg/dL 4.400.50  Sodium 347134 - 425144 mmol/L -  Potassium 3.5 - 5.2 mmol/L -  Chloride 96 - 106 mmol/L -  CO2 20 - 29 mmol/L -  Calcium 8.7 - 10.2 mg/dL -  Total Protein 6.0 - 8.5 g/dL -  Total Bilirubin 0.0 - 1.2 mg/dL -  Alkaline Phos 39 - 956117 IU/L -  AST 0 - 40 IU/L -  ALT 0 - 32 IU/L -   Discharge instruction: per After Visit Summary and "Baby and  Me Booklet".  After visit meds:  Allergies as of 06/14/2018   No Known Allergies     Medication List    STOP taking these medications   terconazole 0.8 % vaginal cream Commonly known as:  TERAZOL 3     TAKE these medications   acetaminophen 500 MG tablet Commonly known as:  TYLENOL Take 500 mg by mouth every 6 (six) hours as needed for moderate pain.   docusate sodium 50 MG capsule Commonly known as:  COLACE Take 1 capsule (50 mg total) by mouth 2 (two) times daily as needed for mild constipation.   ibuprofen 600 MG tablet Commonly known as:  ADVIL,MOTRIN Take 1 tablet (600 mg total) by mouth every 6 (six) hours.   oxyCODONE-acetaminophen 5-325 MG tablet Commonly known as:  PERCOCET/ROXICET Take 1-2 tablets by mouth every 6 (six) hours as needed for severe pain.   PRENATAL 27-1 MG Tabs Take 1 tablet by mouth daily.      Diet: routine diet  Activity: Advance as tolerated. Pelvic rest for 6 weeks.   Outpatient follow up: 1 week for BP check, 4-6 weeks postpartum Follow up Appt: Future Appointments  Date Time Provider Department Center  06/27/2018 10:50 AM Leftwich-Kirby, Wilmer Floor, CNM [facility] None  07/04/2018 11:00 AM Constant, Gigi Gin, MD [facility] None   Follow up Visit:No follow-ups on  file.  Postpartum contraception: OCPs  Newborn Data: Live born female  Birth Weight: 6 lb 9.5 oz (2990 g) APGAR: 8, 9  Newborn Delivery   Birth date/time:  06/11/2018 01:24:00 Delivery type:  C-Section, Low Transverse Trial of labor:  Yes C-section categorization:  Primary    Baby Feeding: Breast Disposition: pending hyperbilirubinemia resolution. Plan to perform circumcision prior to discharge.  06/14/2018 Ellwood Dense, DO  OB FELLOW DISCHARGE ATTESTATION  I have seen and examined this patient. I agree with above documentation and have made edits as needed.   Caryl Ada, DO OB Fellow 4:28 PM

## 2018-06-23 ENCOUNTER — Ambulatory Visit: Payer: BLUE CROSS/BLUE SHIELD

## 2018-06-23 VITALS — BP 118/78

## 2018-06-23 DIAGNOSIS — Z0130 Encounter for examination of blood pressure without abnormal findings: Secondary | ICD-10-CM

## 2018-06-23 NOTE — Progress Notes (Signed)
I have reviewed this chart and agree with the RN/CMA assessment and management.    K. Meryl Kerry-Anne Mezo, M.D. [facility]  

## 2018-06-23 NOTE — Progress Notes (Addendum)
Subjective:  Deborah White is a 32 y.o. female here for BP check.   Hypertension ROS: no medication side effects noted, no TIA's, no chest pain on exertion, no dyspnea on exertion and no swelling of ankles.    Objective:  BP 118/78   Appearance alert, well appearing, and in no distress. General exam BP noted to be well controlled today in office.    Assessment:   Blood Pressure well controlled.   Plan:  Current treatment plan is effective, no change in therapy.

## 2018-06-27 ENCOUNTER — Ambulatory Visit (INDEPENDENT_AMBULATORY_CARE_PROVIDER_SITE_OTHER): Payer: BLUE CROSS/BLUE SHIELD | Admitting: Advanced Practice Midwife

## 2018-06-27 VITALS — BP 113/79 | HR 75 | Wt 209.0 lb

## 2018-06-27 DIAGNOSIS — Z3009 Encounter for other general counseling and advice on contraception: Secondary | ICD-10-CM

## 2018-06-27 DIAGNOSIS — Z4889 Encounter for other specified surgical aftercare: Secondary | ICD-10-CM

## 2018-06-27 MED ORDER — NORETHINDRONE 0.35 MG PO TABS: 1.0000 | ORAL_TABLET | Freq: Every day | ORAL | 11 refills | Status: DC

## 2018-06-27 NOTE — Progress Notes (Signed)
S: 32 y.o. G1P1001 presents for 2 week incision check following PLTCS for fetal intolerance of labor.  Pt is doing well, taking occasional ibuprofen for incision pain.  She is interested in OCPs and is breastfeeding.    O: BP 113/79   Pulse 75   Wt 209 lb (94.8 kg)   BMI 34.78 kg/m    VS reviewed, nursing note reviewed,  Constitutional: well developed, well nourished, no distress HEENT: normocephalic CV: normal rate Pulm/chest wall: normal effort Abdomen: soft Neuro: alert and oriented x 3 Skin: warm, dry Psych: affect normal  A/P:  1. Encounter for postoperative wound check --incision well approximated, no edema, erythema, or exudate.  Steri-strips removed prior to exam today. Pt tolerated well.  2. Encounter for general counseling and advice on contraceptive management --Discussed LARCs as most effective form of birth control.  Pt considering.  Rx for Micronor today.  Pt given printed literature about LARCs.  Will follow up in office.   3. Postpartum care and examination   Sharen CounterLisa Leftwich-Kirby, CNM 12:00 PM

## 2018-06-27 NOTE — Progress Notes (Signed)
Pt is in office for incision check.  Pt states incision is healing well, she is only having some itching at site. Depression score today is 0.

## 2018-07-04 ENCOUNTER — Ambulatory Visit: Payer: Self-pay | Admitting: Obstetrics and Gynecology

## 2018-07-09 IMAGING — US US OB DETAIL+14 WK
1 series · 14 of 28 positions shown · non-contrast
Comparison: none

[Series 1: us ob detail+14 wk · 14 of 53 slices shown]
[im 2/53]
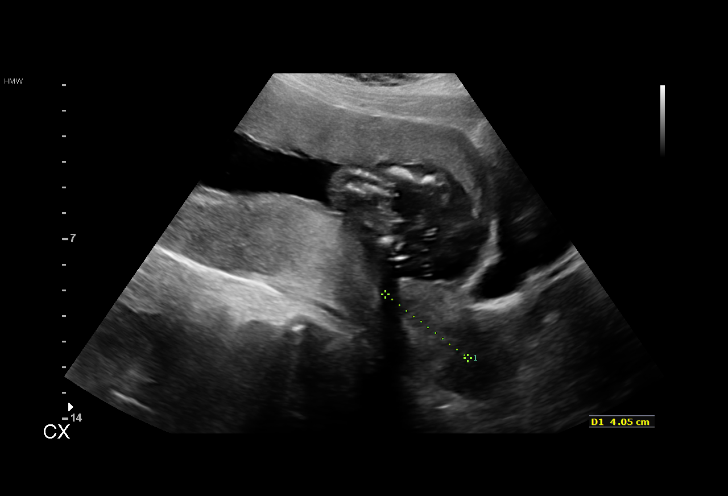
[im 6/53]
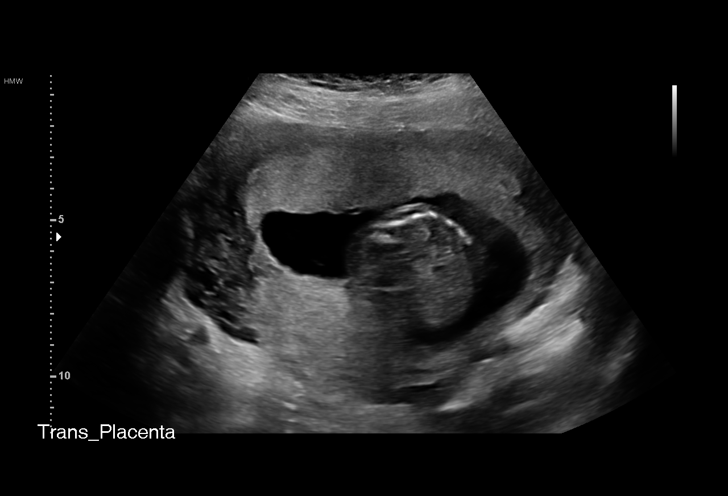
[im 10/53]
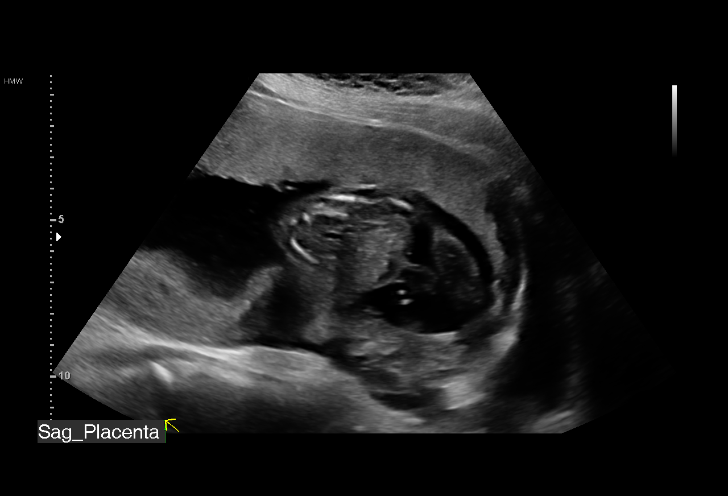
[im 14/53]
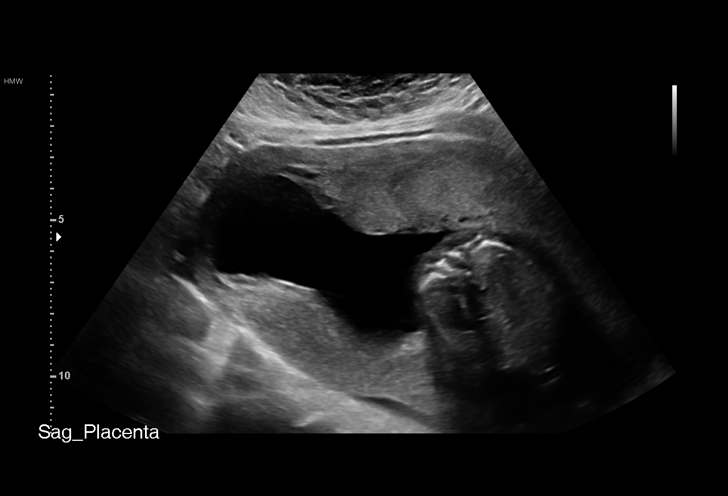
[im 18/53]
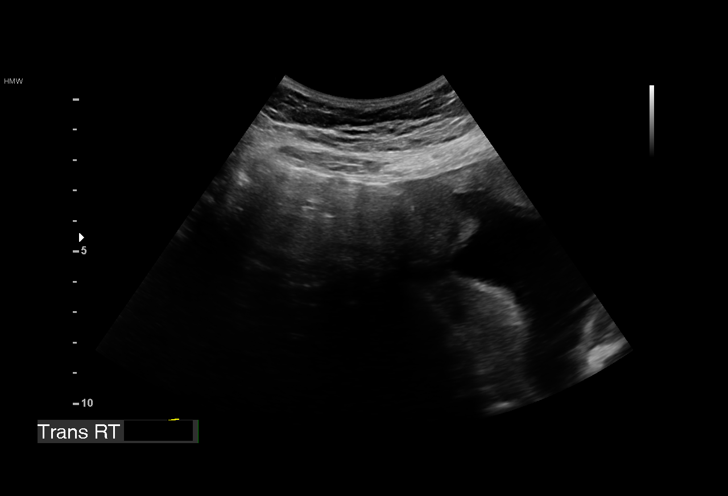
[im 22/53]
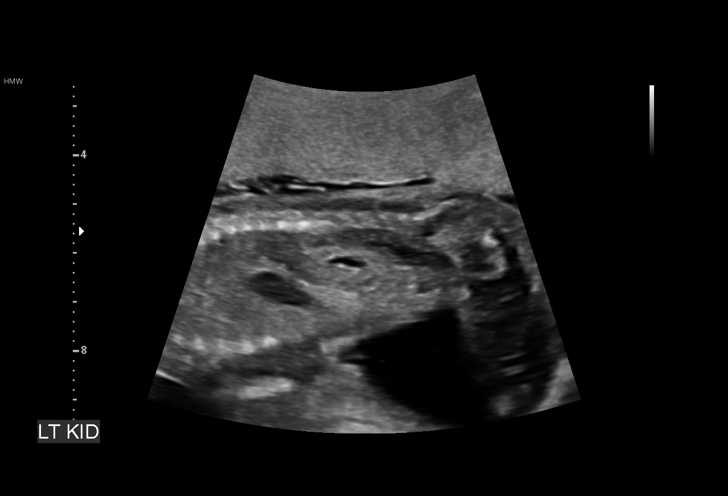
[im 26/53]
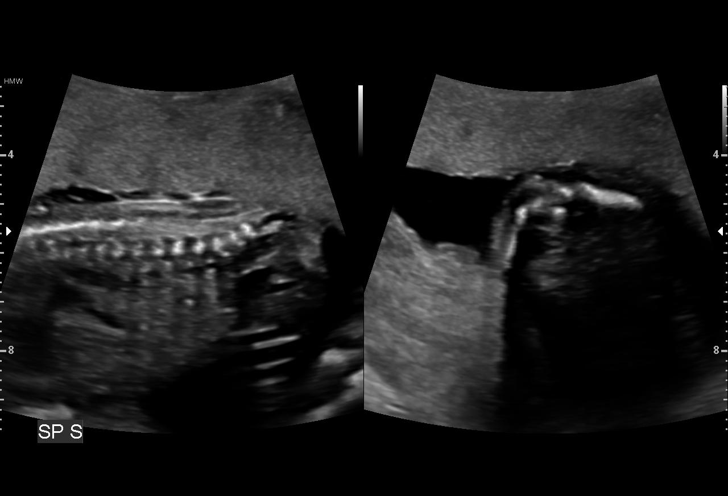
[im 29/53]
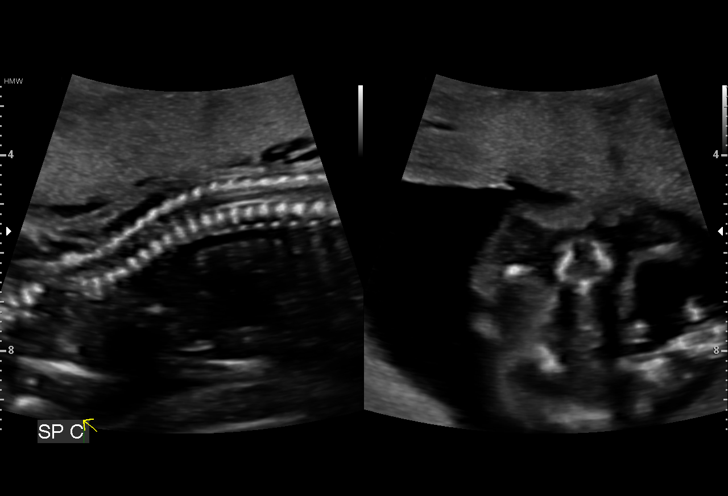
[im 33/53]
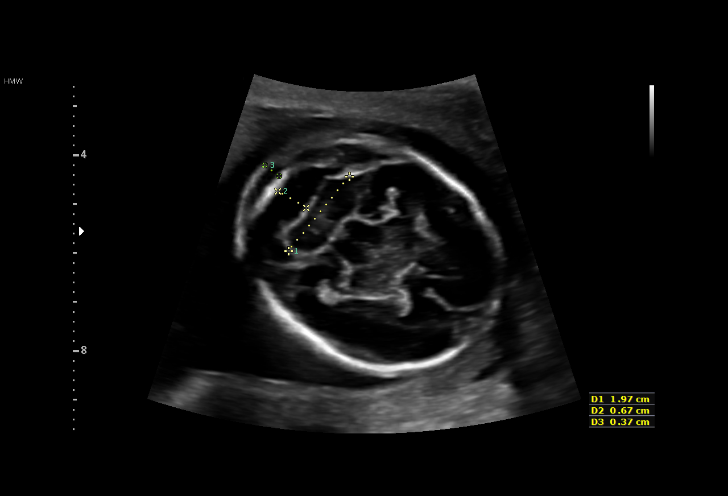
[im 37/53]
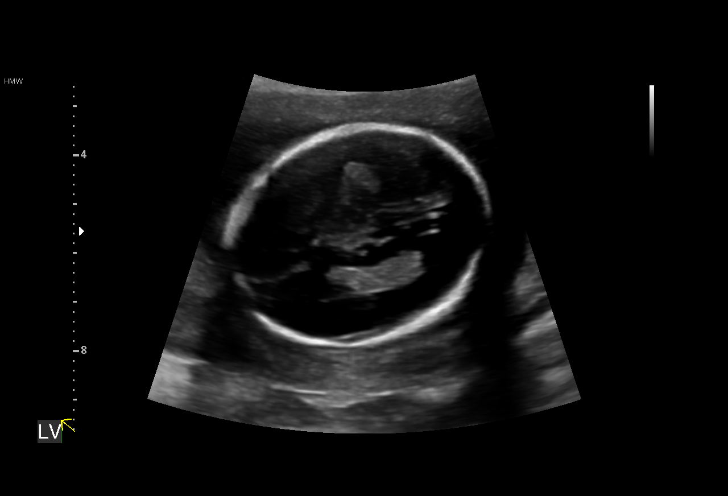
[im 41/53]
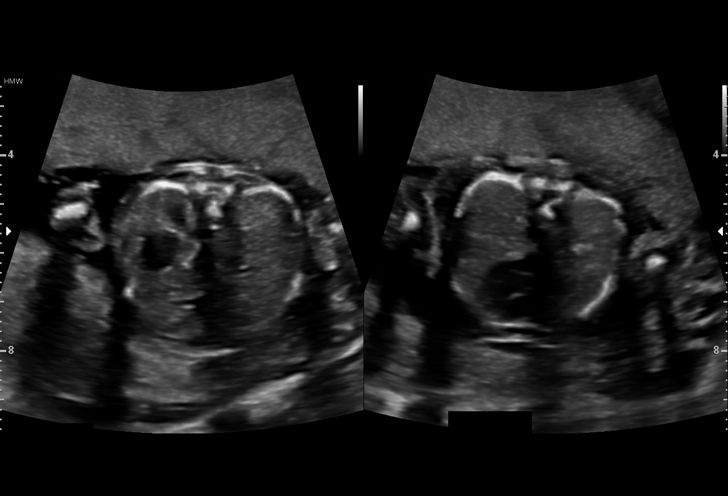
[im 45/53]
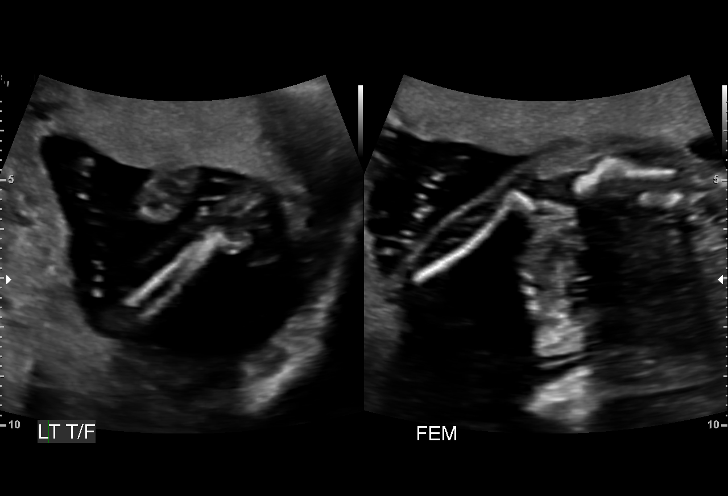
[im 49/53]
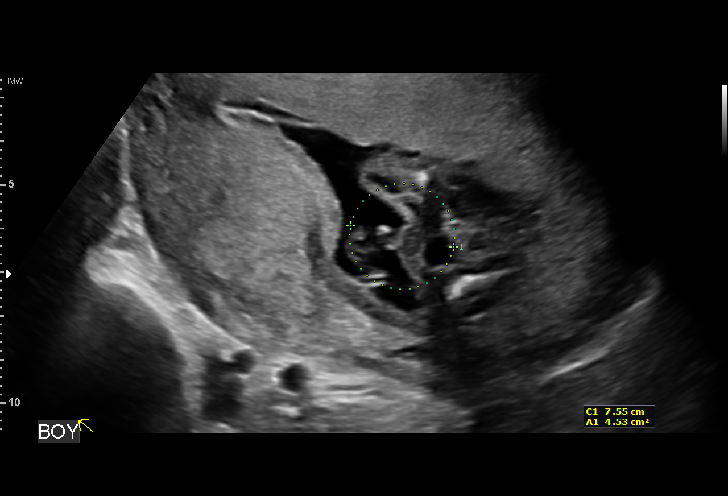
[im 53/53]
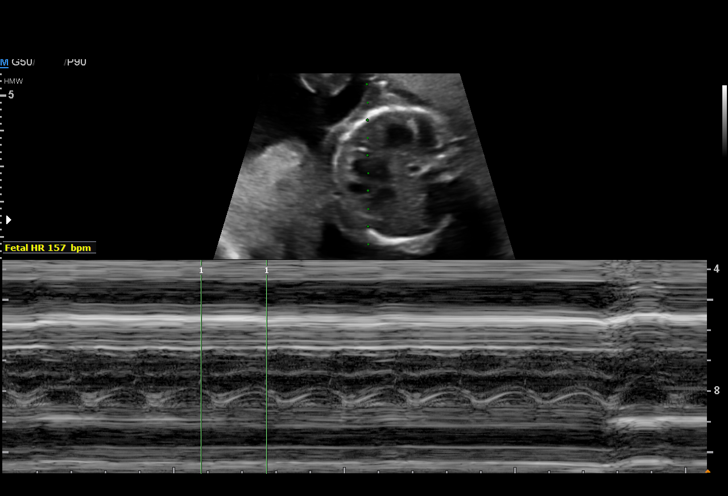

[14 of 28 positions shown; findings below may reference images not displayed]

1  US OB DETAIL + 14 WK                        76811.0

1  PIERRE PATRICK BAVOUA           [id number]      [id number]     [id number]
Indications

19 weeks gestation of pregnancy
Encounter for antenatal screening for
malformations
Obesity complicating pregnancy, second
trimester(
OB History

Blood Type:            Height:  5'4"   Weight (lb):  205       BMI:
Gravidity:    1
Fetal Evaluation

Num Of Fetuses:     1
Fetal Heart         157
Rate(bpm):
Cardiac Activity:   Observed
Presentation:       Breech
Placenta:           Anterior, above cervical os
P. Cord Insertion:  Visualized, central

Amniotic Fluid
AFI FV:      Subjectively within normal limits

Largest Pocket(cm)
4.2
Biometry

BPD:      42.2  mm     G. Age:  18w 5d         29  %    CI:        74.91   %    70 - 86
FL/HC:      18.3   %    16.1 -
HC:      154.7  mm     G. Age:  18w 3d         10  %    HC/AC:      1.19        1.09 -
AC:      130.5  mm     G. Age:  18w 4d         24  %    FL/BPD:     67.1   %
FL:       28.3  mm     G. Age:  18w 5d         23  %    FL/AC:      21.7   %    20 - 24
HUM:      27.4  mm     G. Age:  18w 5d         37  %
Est. FW:     249  gm      0 lb 9 oz     33  %
Gestational Age

LMP:           18w 0d        Date:  09/07/17                 EDD:   06/14/18
U/S Today:     18w 4d                                        EDD:   06/10/18
Best:          19w 2d     Det. By:  U/S  (12/09/17)          EDD:   06/05/18
Anatomy

Cranium:               Appears normal         Aortic Arch:            Not well visualized
Cavum:                 Appears normal         Ductal Arch:            Not well visualized
Ventricles:            Appears normal         Diaphragm:              Not well visualized
Choroid Plexus:        Appears normal         Stomach:                Appears normal, left
sided
Cerebellum:            Appears normal         Abdomen:                Appears normal
Posterior Fossa:       Appears normal         Abdominal Wall:         Not well visualized
Nuchal Fold:           Appears normal         Cord Vessels:           Appears normal (3
vessel cord)
Face:                  Not well visualized    Kidneys:                Appear normal
Lips:                  Not well visualized    Bladder:                Appears normal
Thoracic:              Appears normal         Spine:                  Appears normal
Heart:                 Not well visualized    Upper Extremities:      Appears normal
RVOT:                  Not well visualized    Lower Extremities:      Appears normal
LVOT:                  Not well visualized

Other:  Fetus appears to be a male. Technically difficult due to maternal
habitus and fetal position.
Cervix Uterus Adnexa

Cervix
Length:            4.1  cm.
Normal appearance by transabdominal scan.

Uterus
No abnormality visualized.

Left Ovary
No adnexal mass visualized.

Right Ovary
No adnexal mass visualized.

Cul De Sac:   No free fluid seen.

Adnexa:       No abnormality visualized.
Impression

Singleton intrauterine pregnancy at 19 weeks 2 days
gestation with fetal cardiac activity
Breech presentation
Anterior placenta
Normal appearing fetal growth and amniotic fluid volume
Limited fetal anatomic survey secondary to maternal habitus
and fetal position
Normal appearing cervical length
Recommendations
Recommend follow-up ultrasound examination in 
 9weeks

## 2018-07-13 ENCOUNTER — Other Ambulatory Visit: Payer: Self-pay | Admitting: Family Medicine

## 2018-07-13 DIAGNOSIS — N6310 Unspecified lump in the right breast, unspecified quadrant: Secondary | ICD-10-CM

## 2018-07-18 ENCOUNTER — Ambulatory Visit: Payer: BLUE CROSS/BLUE SHIELD | Admitting: Obstetrics

## 2018-07-19 ENCOUNTER — Other Ambulatory Visit: Payer: Self-pay

## 2018-07-21 ENCOUNTER — Encounter: Payer: Self-pay | Admitting: Obstetrics and Gynecology

## 2018-07-21 ENCOUNTER — Ambulatory Visit (INDEPENDENT_AMBULATORY_CARE_PROVIDER_SITE_OTHER): Payer: BLUE CROSS/BLUE SHIELD | Admitting: Obstetrics and Gynecology

## 2018-07-21 DIAGNOSIS — Z1389 Encounter for screening for other disorder: Secondary | ICD-10-CM

## 2018-07-21 MED ORDER — NORETHINDRONE 0.35 MG PO TABS: 1.0000 | ORAL_TABLET | Freq: Every day | ORAL | 11 refills | Status: DC

## 2018-07-21 NOTE — Addendum Note (Signed)
Addended by: Catalina AntiguaONSTANT, Monterio Bob on: 07/21/2018 10:11 AM   Modules accepted: Orders

## 2018-07-21 NOTE — Progress Notes (Signed)
Post Partum Exam  Deborah White is a 32 y.o. 131P1001 female who presents for a postpartum visit. She is 6 weeks postpartum following a low cervical transverse Cesarean section. I have fully reviewed the prenatal and intrapartum course. The delivery was at 40 gestational weeks.  Anesthesia: spinal. Postpartum course has been unremarkable. Baby's course has been unremarkable. Baby is feeding by breast. Bleeding no bleeding. Bowel function is normal. Bladder function is normal. Patient is not sexually active. Contraception method is OCP. Postpartum depression screening:neg    Last pap smear done 08/28/17 and was Normal  Review of Systems Pertinent items are noted in HPI.    Objective:  Blood pressure 115/73, pulse 76, weight 207 lb (93.9 kg), currently breastfeeding.  General:  alert, cooperative and no distress   Breasts:  inspection negative, no nipple discharge or bleeding, no masses or nodularity palpable  Lungs: clear to auscultation bilaterally  Heart:  regular rate and rhythm  Abdomen: soft, non-tender; bowel sounds normal; no masses,  no organomegaly, incision healed well   Vulva:  normal  Vagina: normal vagina, no discharge, exudate, lesion, or erythema  Cervix:  multiparous appearance  Corpus: normal size, contour, position, consistency, mobility, non-tender  Adnexa:  no mass, fullness, tenderness  Rectal Exam: Not performed.        Assessment:    Normal postpartum exam. Pap smear not normal at today's visit.   Plan:   1. Contraception: oral progesterone-only contraceptive 2. Patient is medically cleared to resume all activities of daily  3. Follow up in: 6 months or as needed.

## 2018-08-17 ENCOUNTER — Other Ambulatory Visit: Payer: Self-pay | Admitting: Obstetrics and Gynecology

## 2018-08-17 DIAGNOSIS — O99345 Other mental disorders complicating the puerperium: Principal | ICD-10-CM

## 2018-08-17 DIAGNOSIS — F53 Postpartum depression: Secondary | ICD-10-CM

## 2018-08-17 MED ORDER — SERTRALINE HCL 50 MG PO TABS
50.0000 mg | ORAL_TABLET | Freq: Every day | ORAL | 3 refills | Status: DC
Start: 2018-08-17 — End: 2020-03-08

## 2018-08-18 ENCOUNTER — Institutional Professional Consult (permissible substitution): Payer: Self-pay

## 2018-08-18 NOTE — BH Specialist Note (Deleted)
Integrated Behavioral Health Initial Visit  MRN: [id number] Name: Deborah White  Number of Integrated Behavioral Health Clinician visits:: 1/6 Session Start time: ***  Session End time: *** Total time: {IBH Total Time:21014050}  Type of Service: Integrated Behavioral Health- Individual/Family Interpretor:Yes.   Interpretor Name and Language: Arabic, ***   Warm Hand Off Completed.       SUBJECTIVE: Deborah White is a 32 y.o. female accompanied by {CHL AMB ACCOMPANIED WU:[id number]}BY:[phone]} Patient was referred by Jaynie CollinsUgonna Anyanwu, MD for depression postpartum Patient reports the following symptoms/concerns: *** Duration of problem: ***; Severity of problem: {Mild/Moderate/Severe:20260}  OBJECTIVE: Mood: {BHH MOOD:22306} and Affect: {BHH AFFECT:22307} Risk of harm to self or others: {CHL AMB BH Suicide Current Mental Status:21022748}  LIFE CONTEXT: Family and Social: *** School/Work: *** Self-Care: *** Life Changes: Recent childbirth ***  GOALS ADDRESSED: Patient will: 1. Reduce symptoms of: {IBH Symptoms:21014056} 2. Increase knowledge and/or ability of: {IBH Patient Tools:21014057}  3. Demonstrate ability to: {IBH Goals:21014053}  INTERVENTIONS: Interventions utilized: {IBH Interventions:21014054}  Standardized Assessments completed: {IBH Screening Tools:21014051}  ASSESSMENT: Patient currently experiencing ***.   Patient may benefit from psychoeducation and brief therapeutic interventions regarding coping with symptoms of *** .  PLAN: 1. Follow up with behavioral health clinician on : *** 2. Behavioral recommendations: *** 3. Referral(s): {IBH Referrals:21014055} 4. "From scale of 1-10, how likely are you to follow plan?": ***  Valetta CloseJamie C McMannes, LCSW  ***

## 2018-08-31 ENCOUNTER — Ambulatory Visit: Payer: BLUE CROSS/BLUE SHIELD | Admitting: Obstetrics and Gynecology

## 2018-09-14 ENCOUNTER — Telehealth: Payer: Self-pay | Admitting: Family Medicine

## 2018-09-14 NOTE — Telephone Encounter (Signed)
LVM for pt to  Call and reschedule her appt with D. Clelia Croft. When pt calls in, please reschedule at her convenience.  OV - back pain-only wants to see Dr. Clelia Croft  Thank you!

## 2018-10-22 ENCOUNTER — Ambulatory Visit: Payer: Self-pay | Admitting: Family Medicine

## 2018-10-22 ENCOUNTER — Other Ambulatory Visit: Payer: Self-pay | Admitting: Family Medicine

## 2018-11-16 ENCOUNTER — Encounter

## 2018-11-16 ENCOUNTER — Ambulatory Visit: Payer: Self-pay | Admitting: Family Medicine

## 2018-11-24 ENCOUNTER — Encounter: Payer: Self-pay | Admitting: Family Medicine

## 2019-01-02 ENCOUNTER — Other Ambulatory Visit: Payer: Self-pay | Admitting: Family Medicine

## 2019-01-03 NOTE — Telephone Encounter (Signed)
Per Lamar Blinks, RN pec left message for pt to call and schedule appointment.

## 2019-01-03 NOTE — Telephone Encounter (Signed)
Requested medication (s) are due for refill today:  yes  Requested medication (s) are on the active medication list:  yes  Future visit scheduled:  no  Last Refill: 10/24/18; #84; no refills  Attempted to reach pt. By phone;  Left vm. to call and schedule appt. for medication refills.   Requested Prescriptions  Pending Prescriptions Disp Refills   SPRINTEC 28 0.25-35 MG-MCG tablet [Pharmacy Med Name: SPRINTEC 28 DAY TABLET] 84 tablet 0    Sig: TAKE 1 TABLET BY MOUTH EVERY DAY     OB/GYN:  Contraceptives Failed - 01/02/2019  5:45 PM      Failed - Valid encounter within last 12 months    Recent Outpatient Visits          9 months ago Acute upper respiratory infection   [facility] [place], [place] D, PA-C   1 year ago Amenorrhea   [facility], [place] July, MD   1 year ago Annual physical exam   [facility], [place] July, MD   1 year ago Allergic contact dermatitis, unspecified trigger   [facility], [place] July, MD   1 year ago Menorrhagia with regular cycle   [facility], [place] July, MD             Passed - Last BP in normal range    BP Readings from Last 1 Encounters:  07/21/18 115/73

## 2019-01-10 ENCOUNTER — Ambulatory Visit: Payer: BLUE CROSS/BLUE SHIELD | Admitting: Advanced Practice Midwife

## 2019-03-27 ENCOUNTER — Other Ambulatory Visit: Payer: Self-pay | Admitting: Family Medicine

## 2019-03-27 NOTE — Telephone Encounter (Signed)
Deborah White pt, please advise

## 2019-03-27 NOTE — Telephone Encounter (Signed)
Requested medication (s) are due for refill today -yes  Requested medication (s) are on the active medication list -yes  Future visit scheduled -no  Last refill: 01/05/19  Notes to clinic: Patient was given courtesy 3 month RF with message to schedule appointment at that time. Patient has not called to schedule TOC appointment. RF request sent for review.  Requested Prescriptions  Pending Prescriptions Disp Refills   SPRINTEC 28 0.25-35 MG-MCG tablet [Pharmacy Med Name: SPRINTEC 28 DAY TABLET] 84 tablet 0    Sig: TAKE 1 TABLET BY MOUTH EVERY DAY     OB/GYN:  Contraceptives Failed - 03/27/2019 12:07 AM      Failed - Valid encounter within last 12 months    Recent Outpatient Visits          1 year ago Acute upper respiratory infection   [facility] [place], [place] D, PA-C   1 year ago Amenorrhea   [facility], [place] July, MD   1 year ago Annual physical exam   [facility], [place] July, MD   1 year ago Allergic contact dermatitis, unspecified trigger   [facility], [place] July, MD   1 year ago Menorrhagia with regular cycle   [facility], [place] July, MD             Passed - Last BP in normal range    BP Readings from Last 1 Encounters:  07/21/18 115/73          Requested Prescriptions  Pending Prescriptions Disp Refills   SPRINTEC 28 0.25-35 MG-MCG tablet [Pharmacy Med Name: SPRINTEC 28 DAY TABLET] 84 tablet 0    Sig: TAKE 1 TABLET BY MOUTH EVERY DAY     OB/GYN:  Contraceptives Failed - 03/27/2019 12:07 AM      Failed - Valid encounter within last 12 months    Recent Outpatient Visits          1 year ago Acute upper respiratory infection   [facility] [place], [place] D, PA-C   1 year ago Amenorrhea   [facility], [place] July, MD   1 year ago Annual physical exam   [facility], [place] July, MD   1 year ago Allergic contact dermatitis, unspecified trigger   [facility], [place]ell July, MD   1 year ago Menorrhagia with regular cycle   [facility], [place] July, MD             Passed - Last BP in normal range    BP Readings from Last 1 Encounters:  07/21/18 115/73

## 2019-05-16 NOTE — Telephone Encounter (Signed)
Called patient and she states she is going to see if her eye doctor will fill the medication first and if not she will call back to schedule an appt

## 2019-07-19 ENCOUNTER — Other Ambulatory Visit: Payer: Self-pay

## 2019-07-19 ENCOUNTER — Ambulatory Visit: Payer: BLUE CROSS/BLUE SHIELD

## 2019-07-19 DIAGNOSIS — Z3200 Encounter for pregnancy test, result unknown: Secondary | ICD-10-CM

## 2019-07-19 DIAGNOSIS — Z3201 Encounter for pregnancy test, result positive: Secondary | ICD-10-CM

## 2019-07-19 LAB — POCT URINE PREGNANCY: Preg Test, Ur: POSITIVE — AB

## 2019-07-19 NOTE — Progress Notes (Signed)
..   [name] presents today for UPT. She has no unusual complaints. LMP:05-24-19     OBJECTIVE: Appears well, in no apparent distress.  OB History    Gravida  2   Para  1   Term  1   Preterm      AB      Living  1     SAB      TAB      Ectopic      Multiple  0   Live Births  1          Home UPT Result:Positive In-Office UPT result:Positive I have reviewed the patient's medical, obstetrical, social, and family histories, and medications.   ASSESSMENT: Positive pregnancy test  PLAN Prenatal care to be completed at: [facility]

## 2019-07-20 NOTE — Progress Notes (Signed)
Agree with A & P. 

## 2019-08-09 ENCOUNTER — Encounter: Payer: Self-pay | Admitting: Obstetrics and Gynecology

## 2019-09-04 ENCOUNTER — Encounter: Payer: Self-pay | Admitting: Obstetrics and Gynecology

## 2019-09-18 ENCOUNTER — Encounter: Payer: Self-pay | Admitting: Obstetrics and Gynecology

## 2019-09-18 ENCOUNTER — Other Ambulatory Visit: Payer: Self-pay

## 2019-09-18 ENCOUNTER — Ambulatory Visit (INDEPENDENT_AMBULATORY_CARE_PROVIDER_SITE_OTHER): Payer: PRIVATE HEALTH INSURANCE | Admitting: Obstetrics and Gynecology

## 2019-09-18 ENCOUNTER — Encounter: Payer: Self-pay | Admitting: Obstetrics

## 2019-09-18 VITALS — BP 116/73 | HR 96 | Wt 202.0 lb

## 2019-09-18 DIAGNOSIS — Z1151 Encounter for screening for human papillomavirus (HPV): Secondary | ICD-10-CM

## 2019-09-18 DIAGNOSIS — Z98891 History of uterine scar from previous surgery: Secondary | ICD-10-CM | POA: Insufficient documentation

## 2019-09-18 DIAGNOSIS — Z113 Encounter for screening for infections with a predominantly sexual mode of transmission: Secondary | ICD-10-CM

## 2019-09-18 DIAGNOSIS — Z3480 Encounter for supervision of other normal pregnancy, unspecified trimester: Secondary | ICD-10-CM | POA: Insufficient documentation

## 2019-09-18 DIAGNOSIS — B373 Candidiasis of vulva and vagina: Secondary | ICD-10-CM | POA: Diagnosis not present

## 2019-09-18 DIAGNOSIS — Z3482 Encounter for supervision of other normal pregnancy, second trimester: Secondary | ICD-10-CM

## 2019-09-18 DIAGNOSIS — Z124 Encounter for screening for malignant neoplasm of cervix: Secondary | ICD-10-CM | POA: Diagnosis not present

## 2019-09-18 DIAGNOSIS — N898 Other specified noninflammatory disorders of vagina: Secondary | ICD-10-CM

## 2019-09-18 DIAGNOSIS — Z3A16 16 weeks gestation of pregnancy: Secondary | ICD-10-CM

## 2019-09-18 DIAGNOSIS — O34219 Maternal care for unspecified type scar from previous cesarean delivery: Secondary | ICD-10-CM

## 2019-09-18 MED ORDER — BLOOD PRESSURE MONITOR KIT: 1.0000 | PACK | 0 refills | Status: DC

## 2019-09-18 NOTE — Progress Notes (Signed)
NOB Not Planned  Pt wants Genetic Screening Declines Flu vaccine.   CC: Vaginal discharge and itching. Foul odor in urine.

## 2019-09-18 NOTE — Progress Notes (Signed)
   Subjective:    Deborah White is a G2P1001 [redacted]w[redacted]d being seen today for her first obstetrical visit.  Her obstetrical history is significant for previous cesarean section due to fetal intolerance of labor. Patient does intend to breast feed. Pregnancy history fully reviewed.  Patient reports no complaints.  Vitals:   09/18/19 1315  BP: 116/73  Pulse: 96  Weight: 202 lb (91.6 kg)    HISTORY: OB History  Gravida Para Term Preterm AB Living  2 1 1     1   SAB TAB Ectopic Multiple Live Births        0 1    # Outcome Date GA Lbr Len/2nd Weight Sex Delivery Anes PTL Lv  2 Current           1 Term 06/11/18 [redacted]w[redacted]d  6 lb 9.5 oz (2.99 kg) M CS-LTranv EPI  LIV   Past Medical History:  Diagnosis Date  . Anemia   . Thyroid disease    Past Surgical History:  Procedure Laterality Date  . BACK SURGERY  2010   cyst removal  . CESAREAN SECTION N/A 06/11/2018   Procedure: CESAREAN SECTION;  Surgeon: Donnamae Jude, MD;  Location: [facility];  Service: Obstetrics;  Laterality: N/A;  . WRIST SURGERY Left 2011   Cyst   Family History  Problem Relation Age of Onset  . Breast cancer Mother   . Hypertension Mother   . Cancer Maternal Uncle        brain cancer   . Hypertension Maternal Grandmother      Exam    Uterus:   16-weeks  Pelvic Exam:    Perineum: No Hemorrhoids, Normal Perineum   Vulva: normal   Vagina:  normal mucosa, normal discharge   pH:    Cervix: multiparous appearance and cervix is closed and long   Adnexa: not evaluated   Bony Pelvis: gynecoid  System: Breast:  normal appearance, no masses or tenderness   Skin: normal coloration and turgor, no rashes    Neurologic: oriented, no focal deficits   Extremities: normal strength, tone, and muscle mass   HEENT extra ocular movement intact   Mouth/Teeth mucous membranes moist, pharynx normal without lesions and dental hygiene good   Neck supple and no masses   Cardiovascular: regular rate and rhythm    Respiratory:  appears well, vitals normal, no respiratory distress, acyanotic, normal RR, chest clear, no wheezing, crepitations, rhonchi, normal symmetric air entry   Abdomen: soft, non-tender; bowel sounds normal; no masses,  no organomegaly   Urinary:       Assessment:    Pregnancy: G2P1001 Patient Active Problem List   Diagnosis Date Noted  . Supervision of other normal pregnancy, antepartum 09/18/2019  . Previous cesarean delivery affecting pregnancy, antepartum 09/18/2019  . Obesity affecting pregnancy, antepartum 12/02/2017  . Family history of breast cancer in mother 01/31/2016  . Breast lump in female 11/29/2013        Plan:     Initial labs drawn. Prenatal vitamins. Problem list reviewed and updated. Genetic Screening discussed : panorama and AFP today.  Ultrasound discussed; fetal survey: ordered.  Follow up in 4 weeks. 50% of 30 min visit spent on counseling and coordination of care.     Zaeda Mcferran 09/18/2019

## 2019-09-18 NOTE — Patient Instructions (Signed)
° °Second Trimester of Pregnancy °The second trimester is from week 14 through week 27 (months 4 through 6). The second trimester is often a time when you feel your best. Your body has adjusted to being pregnant, and you begin to feel better physically. Usually, morning sickness has lessened or quit completely, you may have more energy, and you may have an increase in appetite. The second trimester is also a time when the fetus is growing rapidly. At the end of the sixth month, the fetus is about 9 inches long and weighs about 1½ pounds. You will likely begin to feel the baby move (quickening) between 16 and 20 weeks of pregnancy. °Body changes during your second trimester °Your body continues to go through many changes during your second trimester. The changes vary from woman to woman. °· Your weight will continue to increase. You will notice your lower abdomen bulging out. °· You may begin to get stretch marks on your hips, abdomen, and breasts. °· You may develop headaches that can be relieved by medicines. The medicines should be approved by your health care provider. °· You may urinate more often because the fetus is pressing on your bladder. °· You may develop or continue to have heartburn as a result of your pregnancy. °· You may develop constipation because certain hormones are causing the muscles that push waste through your intestines to slow down. °· You may develop hemorrhoids or swollen, bulging veins (varicose veins). °· You may have back pain. This is caused by: °? Weight gain. °? Pregnancy hormones that are relaxing the joints in your pelvis. °? A shift in weight and the muscles that support your balance. °· Your breasts will continue to grow and they will continue to become tender. °· Your gums may bleed and may be sensitive to brushing and flossing. °· Dark spots or blotches (chloasma, mask of pregnancy) may develop on your face. This will likely fade after the baby is born. °· A dark line from  your belly button to the pubic area (linea nigra) may appear. This will likely fade after the baby is born. °· You may have changes in your hair. These can include thickening of your hair, rapid growth, and changes in texture. Some women also have hair loss during or after pregnancy, or hair that feels dry or thin. Your hair will most likely return to normal after your baby is born. °What to expect at prenatal visits °During a routine prenatal visit: °· You will be weighed to make sure you and the fetus are growing normally. °· Your blood pressure will be taken. °· Your abdomen will be measured to track your baby's growth. °· The fetal heartbeat will be listened to. °· Any test results from the previous visit will be discussed. °Your health care provider may ask you: °· How you are feeling. °· If you are feeling the baby move. °· If you have had any abnormal symptoms, such as leaking fluid, bleeding, severe headaches, or abdominal cramping. °· If you are using any tobacco products, including cigarettes, chewing tobacco, and electronic cigarettes. °· If you have any questions. °Other tests that may be performed during your second trimester include: °· Blood tests that check for: °? Low iron levels (anemia). °? High blood sugar that affects pregnant women (gestational diabetes) between 24 and 28 weeks. °? Rh antibodies. This is to check for a protein on red blood cells (Rh factor). °· Urine tests to check for infections, diabetes, or protein in   the urine. °· An ultrasound to confirm the proper growth and development of the baby. °· An amniocentesis to check for possible genetic problems. °· Fetal screens for spina bifida and Down syndrome. °· HIV (human immunodeficiency virus) testing. Routine prenatal testing includes screening for HIV, unless you choose not to have this test. °Follow these instructions at home: °Medicines °· Follow your health care provider's instructions regarding medicine use. Specific medicines  may be either safe or unsafe to take during pregnancy. °· Take a prenatal vitamin that contains at least 600 micrograms (mcg) of folic acid. °· If you develop constipation, try taking a stool softener if your health care provider approves. °Eating and drinking ° °· Eat a balanced diet that includes fresh fruits and vegetables, whole grains, good sources of protein such as meat, eggs, or tofu, and low-fat dairy. Your health care provider will help you determine the amount of weight gain that is right for you. °· Avoid raw meat and uncooked cheese. These carry germs that can cause birth defects in the baby. °· If you have low calcium intake from food, talk to your health care provider about whether you should take a daily calcium supplement. °· Limit foods that are high in fat and processed sugars, such as fried and sweet foods. °· To prevent constipation: °? Drink enough fluid to keep your urine clear or pale yellow. °? Eat foods that are high in fiber, such as fresh fruits and vegetables, whole grains, and beans. °Activity °· Exercise only as directed by your health care provider. Most women can continue their usual exercise routine during pregnancy. Try to exercise for 30 minutes at least 5 days a week. Stop exercising if you experience uterine contractions. °· Avoid heavy lifting, wear low heel shoes, and practice good posture. °· A sexual relationship may be continued unless your health care provider directs you otherwise. °Relieving pain and discomfort °· Wear a good support bra to prevent discomfort from breast tenderness. °· Take warm sitz baths to soothe any pain or discomfort caused by hemorrhoids. Use hemorrhoid cream if your health care provider approves. °· Rest with your legs elevated if you have leg cramps or low back pain. °· If you develop varicose veins, wear support hose. Elevate your feet for 15 minutes, 3-4 times a day. Limit salt in your diet. °Prenatal Care °· Write down your questions. Take  them to your prenatal visits. °· Keep all your prenatal visits as told by your health care provider. This is important. °Safety °· Wear your seat belt at all times when driving. °· Make a list of emergency phone numbers, including numbers for family, friends, the hospital, and police and fire departments. °General instructions °· Ask your health care provider for a referral to a local prenatal education class. Begin classes no later than the beginning of month 6 of your pregnancy. °· Ask for help if you have counseling or nutritional needs during pregnancy. Your health care provider can offer advice or refer you to specialists for help with various needs. °· Do not use hot tubs, steam rooms, or saunas. °· Do not douche or use tampons or scented sanitary pads. °· Do not cross your legs for long periods of time. °· Avoid cat litter boxes and soil used by cats. These carry germs that can cause birth defects in the baby and possibly loss of the fetus by miscarriage or stillbirth. °· Avoid all smoking, herbs, alcohol, and unprescribed drugs. Chemicals in these products can affect the   formation and growth of the baby.  Do not use any products that contain nicotine or tobacco, such as cigarettes and e-cigarettes. If you need help quitting, ask your health care provider.  Visit your dentist if you have not gone yet during your pregnancy. Use a soft toothbrush to brush your teeth and be gentle when you floss. Contact a health care provider if:  You have dizziness.  You have mild pelvic cramps, pelvic pressure, or nagging pain in the abdominal area.  You have persistent nausea, vomiting, or diarrhea.  You have a bad smelling vaginal discharge.  You have pain when you urinate. Get help right away if:  You have a fever.  You are leaking fluid from your vagina.  You have spotting or bleeding from your vagina.  You have severe abdominal cramping or pain.  You have rapid weight gain or weight loss.  You  have shortness of breath with chest pain.  You notice sudden or extreme swelling of your face, hands, ankles, feet, or legs.  You have not felt your baby move in over an hour.  You have severe headaches that do not go away when you take medicine.  You have vision changes. Summary  The second trimester is from week 14 through week 27 (months 4 through 6). It is also a time when the fetus is growing rapidly.  Your body goes through many changes during pregnancy. The changes vary from woman to woman.  Avoid all smoking, herbs, alcohol, and unprescribed drugs. These chemicals affect the formation and growth your baby.  Do not use any tobacco products, such as cigarettes, chewing tobacco, and e-cigarettes. If you need help quitting, ask your health care provider.  Contact your health care provider if you have any questions. Keep all prenatal visits as told by your health care provider. This is important. This information is not intended to replace advice given to you by your health care provider. Make sure you discuss any questions you have with your health care provider. Document Released: 11/17/2001 Document Revised: 03/17/2019 Document Reviewed: 12/29/2016 Elsevier Patient Education  2020 [employer].   Contraception Choices Contraception, also called birth control, refers to methods or devices that prevent pregnancy. Hormonal methods Contraceptive implant  A contraceptive implant is a thin, plastic tube that contains a hormone. It is inserted into the upper part of the arm. It can remain in place for up to 3 years. Progestin-only injections Progestin-only injections are injections of progestin, a synthetic form of the hormone progesterone. They are given every 3 months by a health care provider. Birth control pills  Birth control pills are pills that contain hormones that prevent pregnancy. They must be taken once a day, preferably at the same time each day. Birth control  patch  The birth control patch contains hormones that prevent pregnancy. It is placed on the skin and must be changed once a week for three weeks and removed on the fourth week. A prescription is needed to use this method of contraception. Vaginal ring  A vaginal ring contains hormones that prevent pregnancy. It is placed in the vagina for three weeks and removed on the fourth week. After that, the process is repeated with a new ring. A prescription is needed to use this method of contraception. Emergency contraceptive Emergency contraceptives prevent pregnancy after unprotected sex. They come in pill form and can be taken up to 5 days after sex. They work best the sooner they are taken after having sex. Most emergency  contraceptives are available without a prescription. This method should not be used as your only form of birth control. Barrier methods Female condom  A female condom is a thin sheath that is worn over the penis during sex. Condoms keep sperm from going inside a woman's body. They can be used with a spermicide to increase their effectiveness. They should be disposed after a single use. Female condom  A female condom is a soft, loose-fitting sheath that is put into the vagina before sex. The condom keeps sperm from going inside a woman's body. They should be disposed after a single use. Diaphragm  A diaphragm is a soft, dome-shaped barrier. It is inserted into the vagina before sex, along with a spermicide. The diaphragm blocks sperm from entering the uterus, and the spermicide kills sperm. A diaphragm should be left in the vagina for 6-8 hours after sex and removed within 24 hours. A diaphragm is prescribed and fitted by a health care provider. A diaphragm should be replaced every 1-2 years, after giving birth, after gaining more than 15 lb (6.8 kg), and after pelvic surgery. Cervical cap  A cervical cap is a round, soft latex or plastic cup that fits over the cervix. It is inserted  into the vagina before sex, along with spermicide. It blocks sperm from entering the uterus. The cap should be left in place for 6-8 hours after sex and removed within 48 hours. A cervical cap must be prescribed and fitted by a health care provider. It should be replaced every 2 years. Sponge  A sponge is a soft, circular piece of polyurethane foam with spermicide on it. The sponge helps block sperm from entering the uterus, and the spermicide kills sperm. To use it, you make it wet and then insert it into the vagina. It should be inserted before sex, left in for at least 6 hours after sex, and removed and thrown away within 30 hours. Spermicides Spermicides are chemicals that kill or block sperm from entering the cervix and uterus. They can come as a cream, jelly, suppository, foam, or tablet. A spermicide should be inserted into the vagina with an applicator at least 10-15 minutes before sex to allow time for it to work. The process must be repeated every time you have sex. Spermicides do not require a prescription. Intrauterine contraception Intrauterine device (IUD) An IUD is a T-shaped device that is put in a woman's uterus. There are two types:  Hormone IUD.This type contains progestin, a synthetic form of the hormone progesterone. This type can stay in place for 3-5 years.  Copper IUD.This type is wrapped in copper wire. It can stay in place for 10 years.  Permanent methods of contraception Female tubal ligation In this method, a woman's fallopian tubes are sealed, tied, or blocked during surgery to prevent eggs from traveling to the uterus. Hysteroscopic sterilization In this method, a small, flexible insert is placed into each fallopian tube. The inserts cause scar tissue to form in the fallopian tubes and block them, so sperm cannot reach an egg. The procedure takes about 3 months to be effective. Another form of birth control must be used during those 3 months. Female sterilization This  is a procedure to tie off the tubes that carry sperm (vasectomy). After the procedure, the man can still ejaculate fluid (semen). Natural planning methods Natural family planning In this method, a couple does not have sex on days when the woman could become pregnant. Calendar method This means keeping track  of the length of each menstrual cycle, identifying the days when pregnancy can happen, and not having sex on those days. Ovulation method In this method, a couple avoids sex during ovulation. Symptothermal method This method involves not having sex during ovulation. The woman typically checks for ovulation by watching changes in her temperature and in the consistency of cervical mucus. Post-ovulation method In this method, a couple waits to have sex until after ovulation. Summary  Contraception, also called birth control, means methods or devices that prevent pregnancy.  Hormonal methods of contraception include implants, injections, pills, patches, vaginal rings, and emergency contraceptives.  Barrier methods of contraception can include female condoms, female condoms, diaphragms, cervical caps, sponges, and spermicides.  There are two types of IUDs (intrauterine devices). An IUD can be put in a woman's uterus to prevent pregnancy for 3-5 years.  Permanent sterilization can be done through a procedure for males, females, or both.  Natural family planning methods involve not having sex on days when the woman could become pregnant. This information is not intended to replace advice given to you by your health care provider. Make sure you discuss any questions you have with your health care provider. Document Released: 11/23/2005 Document Revised: 11/25/2017 Document Reviewed: 12/26/2016 Elsevier Patient Education  2020 [employer]   Breastfeeding  Choosing to breastfeed is one of the best decisions you can make for yourself and your baby. A change in hormones during pregnancy  causes your breasts to make breast milk in your milk-producing glands. Hormones prevent breast milk from being released before your baby is born. They also prompt milk flow after birth. Once breastfeeding has begun, thoughts of your baby, as well as his or her sucking or crying, can stimulate the release of milk from your milk-producing glands. Benefits of breastfeeding Research shows that breastfeeding offers many health benefits for infants and mothers. It also offers a cost-free and convenient way to feed your baby. For your baby  Your first milk (colostrum) helps your baby's digestive system to function better.  Special cells in your milk (antibodies) help your baby to fight off infections.  Breastfed babies are less likely to develop asthma, allergies, obesity, or type 2 diabetes. They are also at lower risk for sudden infant death syndrome (SIDS).  Nutrients in breast milk are better able to meet your babys needs compared to infant formula.  Breast milk improves your baby's brain development. For you  Breastfeeding helps to create a very special bond between you and your baby.  Breastfeeding is convenient. Breast milk costs nothing and is always available at the correct temperature.  Breastfeeding helps to burn calories. It helps you to lose the weight that you gained during pregnancy.  Breastfeeding makes your uterus return faster to its size before pregnancy. It also slows bleeding (lochia) after you give birth.  Breastfeeding helps to lower your risk of developing type 2 diabetes, osteoporosis, rheumatoid arthritis, cardiovascular disease, and breast, ovarian, uterine, and endometrial cancer later in life. Breastfeeding basics Starting breastfeeding  Find a comfortable place to sit or lie down, with your neck and back well-supported.  Place a pillow or a rolled-up blanket under your baby to bring him or her to the level of your breast (if you are seated). Nursing pillows are  specially designed to help support your arms and your baby while you breastfeed.  Make sure that your baby's tummy (abdomen) is facing your abdomen.  Gently massage your breast. With your fingertips, massage from the outer  edges of your breast inward toward the nipple. This encourages milk flow. If your milk flows slowly, you may need to continue this action during the feeding.  Support your breast with 4 fingers underneath and your thumb above your nipple (make the letter "C" with your hand). Make sure your fingers are well away from your nipple and your babys mouth.  Stroke your baby's lips gently with your finger or nipple.  When your baby's mouth is open wide enough, quickly bring your baby to your breast, placing your entire nipple and as much of the areola as possible into your baby's mouth. The areola is the colored area around your nipple. ? More areola should be visible above your baby's upper lip than below the lower lip. ? Your baby's lips should be opened and extended outward (flanged) to ensure an adequate, comfortable latch. ? Your baby's tongue should be between his or her lower gum and your breast.  Make sure that your baby's mouth is correctly positioned around your nipple (latched). Your baby's lips should create a seal on your breast and be turned out (everted).  It is common for your baby to suck about 2-3 minutes in order to start the flow of breast milk. Latching Teaching your baby how to latch onto your breast properly is very important. An improper latch can cause nipple pain, decreased milk supply, and poor weight gain in your baby. Also, if your baby is not latched onto your nipple properly, he or she may swallow some air during feeding. This can make your baby fussy. Burping your baby when you switch breasts during the feeding can help to get rid of the air. However, teaching your baby to latch on properly is still the best way to prevent fussiness from swallowing air  while breastfeeding. Signs that your baby has successfully latched onto your nipple  Silent tugging or silent sucking, without causing you pain. Infant's lips should be extended outward (flanged).  Swallowing heard between every 3-4 sucks once your milk has started to flow (after your let-down milk reflex occurs).  Muscle movement above and in front of his or her ears while sucking. Signs that your baby has not successfully latched onto your nipple  Sucking sounds or smacking sounds from your baby while breastfeeding.  Nipple pain. If you think your baby has not latched on correctly, slip your finger into the corner of your babys mouth to break the suction and place it between your baby's gums. Attempt to start breastfeeding again. Signs of successful breastfeeding Signs from your baby  Your baby will gradually decrease the number of sucks or will completely stop sucking.  Your baby will fall asleep.  Your baby's body will relax.  Your baby will retain a small amount of milk in his or her mouth.  Your baby will let go of your breast by himself or herself. Signs from you  Breasts that have increased in firmness, weight, and size 1-3 hours after feeding.  Breasts that are softer immediately after breastfeeding.  Increased milk volume, as well as a change in milk consistency and color by the fifth day of breastfeeding.  Nipples that are not sore, cracked, or bleeding. Signs that your baby is getting enough milk  Wetting at least 1-2 diapers during the first 24 hours after birth.  Wetting at least 5-6 diapers every 24 hours for the first week after birth. The urine should be clear or pale yellow by the age of 5 days.  Wetting  6-8 diapers every 24 hours as your baby continues to grow and develop.  At least 3 stools in a 24-hour period by the age of 5 days. The stool should be soft and yellow.  At least 3 stools in a 24-hour period by the age of 7 days. The stool should be  seedy and yellow.  No loss of weight greater than 10% of birth weight during the first 3 days of life.  Average weight gain of 4-7 oz (113-198 g) per week after the age of 4 days.  Consistent daily weight gain by the age of 5 days, without weight loss after the age of 2 weeks. After a feeding, your baby may spit up a small amount of milk. This is normal. Breastfeeding frequency and duration Frequent feeding will help you make more milk and can prevent sore nipples and extremely full breasts (breast engorgement). Breastfeed when you feel the need to reduce the fullness of your breasts or when your baby shows signs of hunger. This is called "breastfeeding on demand." Signs that your baby is hungry include:  Increased alertness, activity, or restlessness.  Movement of the head from side to side.  Opening of the mouth when the corner of the mouth or cheek is stroked (rooting).  Increased sucking sounds, smacking lips, cooing, sighing, or squeaking.  Hand-to-mouth movements and sucking on fingers or hands.  Fussing or crying. Avoid introducing a pacifier to your baby in the first 4-6 weeks after your baby is born. After this time, you may choose to use a pacifier. Research has shown that pacifier use during the first year of a baby's life decreases the risk of sudden infant death syndrome (SIDS). Allow your baby to feed on each breast as long as he or she wants. When your baby unlatches or falls asleep while feeding from the first breast, offer the second breast. Because newborns are often sleepy in the first few weeks of life, you may need to awaken your baby to get him or her to feed. Breastfeeding times will vary from baby to baby. However, the following rules can serve as a guide to help you make sure that your baby is properly fed:  Newborns (babies 82 weeks of age or younger) may breastfeed every 1-3 hours.  Newborns should not go without breastfeeding for longer than 3 hours during the  day or 5 hours during the night.  You should breastfeed your baby a minimum of 8 times in a 24-hour period. Breast milk pumping     Pumping and storing breast milk allows you to make sure that your baby is exclusively fed your breast milk, even at times when you are unable to breastfeed. This is especially important if you go back to work while you are still breastfeeding, or if you are not able to be present during feedings. Your lactation consultant can help you find a method of pumping that works best for you and give you guidelines about how long it is safe to store breast milk. Caring for your breasts while you breastfeed Nipples can become dry, cracked, and sore while breastfeeding. The following recommendations can help keep your breasts moisturized and healthy:  Avoid using soap on your nipples.  Wear a supportive bra designed especially for nursing. Avoid wearing underwire-style bras or extremely tight bras (sports bras).  Air-dry your nipples for 3-4 minutes after each feeding.  Use only cotton bra pads to absorb leaked breast milk. Leaking of breast milk between feedings is normal.  Use lanolin on your nipples after breastfeeding. Lanolin helps to maintain your skin's normal moisture barrier. Pure lanolin is not harmful (not toxic) to your baby. You may also hand express a few drops of breast milk and gently massage that milk into your nipples and allow the milk to air-dry. In the first few weeks after giving birth, some women experience breast engorgement. Engorgement can make your breasts feel heavy, warm, and tender to the touch. Engorgement peaks within 3-5 days after you give birth. The following recommendations can help to ease engorgement:  Completely empty your breasts while breastfeeding or pumping. You may want to start by applying warm, moist heat (in the shower or with warm, water-soaked hand towels) just before feeding or pumping. This increases circulation and helps the  milk flow. If your baby does not completely empty your breasts while breastfeeding, pump any extra milk after he or she is finished.  Apply ice packs to your breasts immediately after breastfeeding or pumping, unless this is too uncomfortable for you. To do this: ? Put ice in a plastic bag. ? Place a towel between your skin and the bag. ? Leave the ice on for 20 minutes, 2-3 times a day.  Make sure that your baby is latched on and positioned properly while breastfeeding. If engorgement persists after 48 hours of following these recommendations, contact your health care provider or a Advertising copywriter. Overall health care recommendations while breastfeeding  Eat 3 healthy meals and 3 snacks every day. Well-nourished mothers who are breastfeeding need an additional 450-500 calories a day. You can meet this requirement by increasing the amount of a balanced diet that you eat.  Drink enough water to keep your urine pale yellow or clear.  Rest often, relax, and continue to take your prenatal vitamins to prevent fatigue, stress, and low vitamin and mineral levels in your body (nutrient deficiencies).  Do not use any products that contain nicotine or tobacco, such as cigarettes and e-cigarettes. Your baby may be harmed by chemicals from cigarettes that pass into breast milk and exposure to secondhand smoke. If you need help quitting, ask your health care provider.  Avoid alcohol.  Do not use illegal drugs or marijuana.  Talk with your health care provider before taking any medicines. These include over-the-counter and prescription medicines as well as vitamins and herbal supplements. Some medicines that may be harmful to your baby can pass through breast milk.  It is possible to become pregnant while breastfeeding. If birth control is desired, ask your health care provider about options that will be safe while breastfeeding your baby. Where to find more information: [employer]: www.llli.org Contact a health care provider if:  You feel like you want to stop breastfeeding or have become frustrated with breastfeeding.  Your nipples are cracked or bleeding.  Your breasts are red, tender, or warm.  You have: ? Painful breasts or nipples. ? A swollen area on either breast. ? A fever or chills. ? Nausea or vomiting. ? Drainage other than breast milk from your nipples.  Your breasts do not become full before feedings by the fifth day after you give birth.  You feel sad and depressed.  Your baby is: ? Too sleepy to eat well. ? Having trouble sleeping. ? More than 19 week old and wetting fewer than 6 diapers in a 24-hour period. ? Not gaining weight by 42 days of age.  Your baby has fewer than 3 stools in a 24-hour period.  Your baby's skin or the white parts of his or her eyes become yellow. Get help right away if:  Your baby is overly tired (lethargic) and does not want to wake up and feed.  Your baby develops an unexplained fever. Summary  Breastfeeding offers many health benefits for infant and mothers.  Try to breastfeed your infant when he or she shows early signs of hunger.  Gently tickle or stroke your baby's lips with your finger or nipple to allow the baby to open his or her mouth. Bring the baby to your breast. Make sure that much of the areola is in your baby's mouth. Offer one side and burp the baby before you offer the other side.  Talk with your health care provider or lactation consultant if you have questions or you face problems as you breastfeed. This information is not intended to replace advice given to you by your health care provider. Make sure you discuss any questions you have with your health care provider. Document Released: 11/23/2005 Document Revised: 02/17/2018 Document Reviewed: 12/25/2016 Elsevier Patient Education  2020 [employer].   Preparing for Vaginal Birth After Cesarean Delivery Vaginal birth  after cesarean delivery (VBAC) is giving birth vaginally after previously delivering a baby through a cesarean section (C-section). You and your health are provider will discuss your options and whether you may be a good candidate for VBAC. What are my options? After a cesarean delivery, your options for future deliveries may include:  Scheduled repeat cesarean delivery. This is done in a hospital with an operating room.  Trial of labor after cesarean (TOLAC). A successful TOLAC results in a vaginal delivery. If it is not successful, you will need to have a cesarean delivery. TOLAC should be attempted in facilities where an emergency cesarean delivery can be performed. It should not be done as a home birth. Talk with your health care provider about the risks and benefits of each option early in your pregnancy. The best option for you will depend on your preferences and your overall health as well as your baby's. What should I know about my past cesarean delivery? It is important to know what type of incision was made in your uterus in a past cesarean delivery. The type of incision can affect the success of your TOLAC. Types of incisions include:  Low transverse. This is a side-to-side cut low on your uterus. The scar on your skin looks like a horizontal line just above your pubic area. This type of cut is the most common and makes you a good candidate for TOLAC.  Low vertical. This is an up-and-down cut low on your uterus. The scar on your skin looks like a vertical line between your pubic area and belly button. This type of cut puts you at higher risk for problems during TOLAC.  High vertical or classical. This is an up-and-down cut high on your uterus. The scar on your skin looks like a vertical line that runs over the top of your belly button. This type of cut has the highest risk for problems and usually means that TOLAC is not an option. When is VBAC not an option? As you progress through your  pregnancy, circumstances may change and you may need to reconsider your options. Your situation may also change even as you begin TOLAC. Your health care provider may not want you to attempt a VBAC if you:  Need to have labor started (induced) because your cervix is not ready for labor.  Have never had a vaginal delivery.  Have had more than two cesarean deliveries.  Are overdue.  Are pregnant with a very large baby.  Have a condition that causes high blood pressure (preeclampsia). Questions to ask your health care provider  Am I a good candidate for TOLAC?  What are my chances of a successful vaginal delivery?  Is my preferred birth location equipped for a TOLAC?  What are my pain management options during a TOLAC? Where to find more information  American Congress of Obstetricians and Gynecologists: www.acog.org  [employer]: www.midwife.org Summary  Vaginal birth after cesarean delivery (VBAC) is giving birth vaginally after previously delivering a baby through a cesarean section (C-section).  VBAC may be a safe and appropriate option for you depending on your medical history and other risk factors. Talk with your health care provider about the options available to you, and the risks and benefits of each early in your pregnancy.  TOLAC should be attempted in facilities where emergency cesarean section procedures can be performed. This information is not intended to replace advice given to you by your health care provider. Make sure you discuss any questions you have with your health care provider. Document Released: 08/11/2011 Document Revised: 03/21/2019 Document Reviewed: 03/04/2017 Elsevier Patient Education  2020 [employer]

## 2019-09-20 LAB — OBSTETRIC PANEL, INCLUDING HIV
Antibody Screen: NEGATIVE
Basophils Absolute: 0 10*3/uL (ref 0.0–0.2)
Basos: 0 %
EOS (ABSOLUTE): 0.1 10*3/uL (ref 0.0–0.4)
Eos: 1 %
HIV Screen 4th Generation wRfx: NONREACTIVE
Hematocrit: 37.3 % (ref 34.0–46.6)
Hemoglobin: 12.3 g/dL (ref 11.1–15.9)
Hepatitis B Surface Ag: NEGATIVE
Immature Grans (Abs): 0.1 10*3/uL (ref 0.0–0.1)
Immature Granulocytes: 1 %
Lymphocytes Absolute: 1.7 10*3/uL (ref 0.7–3.1)
Lymphs: 19 %
MCH: 27.7 pg (ref 26.6–33.0)
MCHC: 33 g/dL (ref 31.5–35.7)
MCV: 84 fL (ref 79–97)
Monocytes Absolute: 0.6 10*3/uL (ref 0.1–0.9)
Monocytes: 7 %
Neutrophils Absolute: 6.5 10*3/uL (ref 1.4–7.0)
Neutrophils: 72 %
Platelets: 221 10*3/uL (ref 150–450)
RBC: 4.44 x10E6/uL (ref 3.77–5.28)
RDW: 13.5 % (ref 11.7–15.4)
RPR Ser Ql: NONREACTIVE
Rh Factor: POSITIVE
Rubella Antibodies, IGG: 10.4 index (ref >0.99)
WBC: 9 10*3/uL (ref 3.4–10.8)

## 2019-09-20 LAB — AFP, SERUM, OPEN SPINA BIFIDA
AFP MoM: 1.17
AFP Value: 34.6 ng/mL
Gest. Age on Collection Date: 16.5 weeks
Maternal Age At EDD: 33.4 yr
OSBR Risk 1 IN: 7137
Test Results:: NEGATIVE
Weight: 202 [lb_av]

## 2019-09-20 LAB — URINE CULTURE, OB REFLEX

## 2019-09-20 LAB — HEMOGLOBIN A1C
Est. average glucose Bld gHb Est-mCnc: 100 mg/dL
Hgb A1c MFr Bld: 5.1 % (ref 4.8–5.6)

## 2019-09-20 LAB — CULTURE, OB URINE

## 2019-09-20 MED ORDER — CEPHALEXIN 500 MG PO CAPS: 500.0000 mg | ORAL_CAPSULE | Freq: Four times a day (QID) | ORAL | 2 refills | Status: DC

## 2019-09-20 NOTE — Addendum Note (Signed)
Addended by: Mora Bellman on: 09/20/2019 03:31 PM   Modules accepted: Orders

## 2019-09-22 LAB — CERVICOVAGINAL ANCILLARY ONLY
Bacterial Vaginitis (gardnerella): NEGATIVE
Candida Glabrata: NEGATIVE
Candida Vaginitis: POSITIVE — AB
Chlamydia: NEGATIVE
Comment: NEGATIVE
Comment: NEGATIVE
Comment: NEGATIVE
Comment: NEGATIVE
Comment: NEGATIVE
Comment: NORMAL
Neisseria Gonorrhea: NEGATIVE
Trichomonas: NEGATIVE

## 2019-09-22 LAB — CYTOLOGY - PAP
Comment: NEGATIVE
Comment: NEGATIVE
Diagnosis: NEGATIVE
HPV 16: NEGATIVE
HPV 18 / 45: NEGATIVE
High risk HPV: POSITIVE — AB

## 2019-09-25 ENCOUNTER — Encounter: Payer: Self-pay | Admitting: Obstetrics and Gynecology

## 2019-09-26 ENCOUNTER — Encounter: Payer: Self-pay | Admitting: Obstetrics and Gynecology

## 2019-09-26 ENCOUNTER — Telehealth: Payer: Self-pay | Admitting: Obstetrics and Gynecology

## 2019-09-26 MED ORDER — CEPHALEXIN 500 MG PO CAPS: 500.0000 mg | ORAL_CAPSULE | Freq: Four times a day (QID) | ORAL | 0 refills | Status: AC

## 2019-09-26 NOTE — Telephone Encounter (Signed)
Patient called regarding her Keflex Rx.  She states it was not at her pharmacy.  In looking it was noted to have been sent to wrong pharmacy.    Resending to correct pharmacy.

## 2019-10-12 ENCOUNTER — Other Ambulatory Visit: Payer: Self-pay | Admitting: Obstetrics and Gynecology

## 2019-10-12 ENCOUNTER — Other Ambulatory Visit: Payer: Self-pay

## 2019-10-12 ENCOUNTER — Ambulatory Visit (HOSPITAL_COMMUNITY)
Admission: RE | Admit: 2019-10-12 | Discharge: 2019-10-12 | Disposition: A | Discharge status: Home / Self Care | Payer: PRIVATE HEALTH INSURANCE | Source: Ambulatory Visit | Attending: Obstetrics and Gynecology | Admitting: Obstetrics and Gynecology

## 2019-10-12 DIAGNOSIS — O359XX0 Maternal care for (suspected) fetal abnormality and damage, unspecified, not applicable or unspecified: Secondary | ICD-10-CM | POA: Diagnosis not present

## 2019-10-12 DIAGNOSIS — Z3480 Encounter for supervision of other normal pregnancy, unspecified trimester: Secondary | ICD-10-CM

## 2019-10-12 DIAGNOSIS — Z3A20 20 weeks gestation of pregnancy: Secondary | ICD-10-CM

## 2019-10-12 DIAGNOSIS — O99212 Obesity complicating pregnancy, second trimester: Secondary | ICD-10-CM

## 2019-10-12 DIAGNOSIS — O34219 Maternal care for unspecified type scar from previous cesarean delivery: Secondary | ICD-10-CM | POA: Diagnosis not present

## 2019-10-13 ENCOUNTER — Other Ambulatory Visit (HOSPITAL_COMMUNITY): Payer: Self-pay | Admitting: *Deleted

## 2019-10-13 DIAGNOSIS — O358XX0 Maternal care for other (suspected) fetal abnormality and damage, not applicable or unspecified: Secondary | ICD-10-CM

## 2019-10-13 DIAGNOSIS — O35EXX0 Maternal care for other (suspected) fetal abnormality and damage, fetal genitourinary anomalies, not applicable or unspecified: Secondary | ICD-10-CM

## 2019-10-16 ENCOUNTER — Ambulatory Visit (INDEPENDENT_AMBULATORY_CARE_PROVIDER_SITE_OTHER): Payer: PRIVATE HEALTH INSURANCE | Admitting: Advanced Practice Midwife

## 2019-10-16 ENCOUNTER — Other Ambulatory Visit: Payer: Self-pay

## 2019-10-16 ENCOUNTER — Encounter: Payer: Self-pay | Admitting: Obstetrics

## 2019-10-16 VITALS — BP 119/76 | HR 75 | Wt 206.0 lb

## 2019-10-16 DIAGNOSIS — O35EXX0 Maternal care for other (suspected) fetal abnormality and damage, fetal genitourinary anomalies, not applicable or unspecified: Secondary | ICD-10-CM

## 2019-10-16 DIAGNOSIS — O34219 Maternal care for unspecified type scar from previous cesarean delivery: Secondary | ICD-10-CM

## 2019-10-16 DIAGNOSIS — Z3480 Encounter for supervision of other normal pregnancy, unspecified trimester: Secondary | ICD-10-CM

## 2019-10-16 DIAGNOSIS — Z3A20 20 weeks gestation of pregnancy: Secondary | ICD-10-CM

## 2019-10-16 DIAGNOSIS — O358XX0 Maternal care for other (suspected) fetal abnormality and damage, not applicable or unspecified: Secondary | ICD-10-CM

## 2019-10-16 MED ORDER — PRENATAL 27-1 MG PO TABS: 1.0000 | ORAL_TABLET | Freq: Every day | ORAL | 4 refills | Status: DC

## 2019-10-16 NOTE — Progress Notes (Signed)
   PRENATAL VISIT NOTE  Subjective:  Deborah White is a 33 y.o. G2P1001 at [redacted]w[redacted]d being seen today for ongoing prenatal care.  She is currently monitored for the following issues for this low-risk pregnancy and has Breast lump in female; Family history of breast cancer in mother; Obesity affecting pregnancy, antepartum; Supervision of other normal pregnancy, antepartum; and Previous cesarean delivery affecting pregnancy, antepartum on their problem list.  Patient reports no complaints.  Contractions: Not present. Vag. Bleeding: None.  Movement: Present. Denies leaking of fluid.   The following portions of the patient's history were reviewed and updated as appropriate: allergies, current medications, past family history, past medical history, past social history, past surgical history and problem list.   Objective:   Vitals:   10/16/19 1518  BP: 119/76  Pulse: 75  Weight: 206 lb (93.4 kg)    Fetal Status:     Movement: Present     General:  Alert, oriented and cooperative. Patient is in no acute distress.  Skin: Skin is warm and dry. No rash noted.   Cardiovascular: Normal heart rate noted  Respiratory: Normal respiratory effort, no problems with respiration noted  Abdomen: Soft, gravid, appropriate for gestational age.  Pain/Pressure: Present     Pelvic: Cervical exam deferred        Extremities: Normal range of motion.  Edema: None  Mental Status: Normal mood and affect. Normal behavior. Normal judgment and thought content.   Assessment and Plan:  Pregnancy: G2P1001 at [redacted]w[redacted]d  1. Supervision of other normal pregnancy, antepartum Deborah White's pregnancy is progressing appropriately. Patient endorses daily fetal movement and denies any other complaints.  -refill of prenatal vitamins  -26 week follow up for GTT   2. Pyelectasis of fetus on prenatal ultrasound Mild bilateral pyelectasis noted on MFM ultrasound 11/05. Patient shares that the ultrasound findings, known  association with Down syndrome, and the offer for amniocentesis have caused her significant worry. Today we thoroughly discussed with patient her risk for Down syndrome focusing on counseling and reassurance. Patient is scheduled for a follow up ultrasound 11/09/2019.    3. Previous cesarean delivery affecting pregnancy, antepartum Plan to discuss desired mode of delivery in coming visits.    Preterm labor symptoms and general obstetric precautions including but not limited to vaginal bleeding, contractions, leaking of fluid and fetal movement were reviewed in detail with the patient. Please refer to After Visit Summary for other counseling recommendations.   Return in about 6 weeks (around 11/27/2019).  Future Appointments  Date Time Provider [place]  11/09/2019  1:15 PM [place] [facility]  11/09/2019  1:15 PM [place] 4 [facility]  11/27/2019  8:00 AM [facility] None  11/27/2019  8:15 AM [place], Kathie Dike, CNM [facility] None    Mercy Moore, Medical Student

## 2019-10-16 NOTE — Patient Instructions (Signed)

## 2019-10-16 NOTE — Progress Notes (Signed)
ROB.  She is concerned with [place] results.

## 2019-10-19 ENCOUNTER — Other Ambulatory Visit: Payer: Self-pay

## 2019-10-19 DIAGNOSIS — N898 Other specified noninflammatory disorders of vagina: Secondary | ICD-10-CM

## 2019-10-19 MED ORDER — TERCONAZOLE 0.8 % VA CREA: 1.0000 | TOPICAL_CREAM | Freq: Every day | VAGINAL | 0 refills | Status: DC

## 2019-10-19 NOTE — Progress Notes (Signed)
Rx sent per protocol pt notified and voiced understanding.

## 2019-11-09 ENCOUNTER — Other Ambulatory Visit: Payer: Self-pay

## 2019-11-09 ENCOUNTER — Ambulatory Visit (HOSPITAL_COMMUNITY): Payer: PRIVATE HEALTH INSURANCE | Admitting: *Deleted

## 2019-11-09 ENCOUNTER — Other Ambulatory Visit (HOSPITAL_COMMUNITY): Payer: Self-pay | Admitting: *Deleted

## 2019-11-09 ENCOUNTER — Encounter (HOSPITAL_COMMUNITY): Payer: Self-pay

## 2019-11-09 ENCOUNTER — Ambulatory Visit (HOSPITAL_COMMUNITY)
Admission: RE | Admit: 2019-11-09 | Discharge: 2019-11-09 | Disposition: A | Discharge status: Home / Self Care | Payer: PRIVATE HEALTH INSURANCE | Source: Ambulatory Visit | Attending: Obstetrics | Admitting: Obstetrics

## 2019-11-09 DIAGNOSIS — Z3480 Encounter for supervision of other normal pregnancy, unspecified trimester: Secondary | ICD-10-CM | POA: Insufficient documentation

## 2019-11-09 DIAGNOSIS — O35EXX0 Maternal care for other (suspected) fetal abnormality and damage, fetal genitourinary anomalies, not applicable or unspecified: Secondary | ICD-10-CM

## 2019-11-09 DIAGNOSIS — O359XX0 Maternal care for (suspected) fetal abnormality and damage, unspecified, not applicable or unspecified: Secondary | ICD-10-CM

## 2019-11-09 DIAGNOSIS — Z362 Encounter for other antenatal screening follow-up: Secondary | ICD-10-CM

## 2019-11-09 DIAGNOSIS — O34219 Maternal care for unspecified type scar from previous cesarean delivery: Secondary | ICD-10-CM | POA: Insufficient documentation

## 2019-11-09 DIAGNOSIS — O99212 Obesity complicating pregnancy, second trimester: Secondary | ICD-10-CM | POA: Diagnosis not present

## 2019-11-09 DIAGNOSIS — O358XX0 Maternal care for other (suspected) fetal abnormality and damage, not applicable or unspecified: Secondary | ICD-10-CM | POA: Diagnosis present

## 2019-11-09 DIAGNOSIS — Z3A24 24 weeks gestation of pregnancy: Secondary | ICD-10-CM

## 2019-11-27 ENCOUNTER — Other Ambulatory Visit: Payer: Self-pay

## 2019-11-27 ENCOUNTER — Encounter: Payer: Self-pay | Admitting: Advanced Practice Midwife

## 2019-11-27 ENCOUNTER — Other Ambulatory Visit: Payer: PRIVATE HEALTH INSURANCE

## 2019-11-27 ENCOUNTER — Ambulatory Visit (INDEPENDENT_AMBULATORY_CARE_PROVIDER_SITE_OTHER): Payer: PRIVATE HEALTH INSURANCE | Admitting: Advanced Practice Midwife

## 2019-11-27 VITALS — BP 109/70 | HR 83 | Wt 207.0 lb

## 2019-11-27 DIAGNOSIS — O2612 Low weight gain in pregnancy, second trimester: Secondary | ICD-10-CM

## 2019-11-27 DIAGNOSIS — Z3A26 26 weeks gestation of pregnancy: Secondary | ICD-10-CM

## 2019-11-27 DIAGNOSIS — Z3480 Encounter for supervision of other normal pregnancy, unspecified trimester: Secondary | ICD-10-CM

## 2019-11-27 DIAGNOSIS — O26812 Pregnancy related exhaustion and fatigue, second trimester: Secondary | ICD-10-CM

## 2019-11-27 MED ORDER — ENSURE PO LIQD: 237.0000 mL | Freq: Two times a day (BID) | ORAL | 12 refills | Status: DC

## 2019-11-27 NOTE — Progress Notes (Addendum)
   PRENATAL VISIT NOTE  Subjective:  Deborah White is a 33 y.o. G2P1001 at [redacted]w[redacted]d being seen today for ongoing prenatal care.  She is currently monitored for the following issues for this low-risk pregnancy and has Breast lump in female; Family history of breast cancer in mother; Obesity affecting pregnancy, antepartum; Supervision of other normal pregnancy, antepartum; and Previous cesarean delivery affecting pregnancy, antepartum on their problem list.  Patient reports no complaints.  Contractions: Not present. Vag. Bleeding: None.  Movement: Present. Denies leaking of fluid.   The following portions of the patient's history were reviewed and updated as appropriate: allergies, current medications, past family history, past medical history, past social history, past surgical history and problem list.   Objective:   Vitals:   11/27/19 0821  BP: 109/70  Pulse: 83  Weight: 207 lb (93.9 kg)    Fetal Status: Fetal Heart Rate (bpm): 158   Movement: Present     General:  Alert, oriented and cooperative. Patient is in no acute distress.  Skin: Skin is warm and dry. No rash noted.   Cardiovascular: Normal heart rate noted  Respiratory: Normal respiratory effort, no problems with respiration noted  Abdomen: Soft, gravid, appropriate for gestational age.  Pain/Pressure: Absent     Pelvic: Cervical exam deferred        Extremities: Normal range of motion.  Edema: None  Mental Status: Normal mood and affect. Normal behavior. Normal judgment and thought content.   Assessment and Plan:  Pregnancy: G2P1001 at [redacted]w[redacted]d 1. Supervision of other normal pregnancy, antepartum --Anticipatory guidance about next visits/weeks of pregnancy given. --Reviewed UTD on ultrasound with improved measurements on follow up 12/3. Questions answered.  Pt was anxious about this finding but feeling better about it now. - HEP, RPR, HIV Panel - CBC - Glucose Tolerance, 2 Hours w/1 Hour   2. Fatigue during  pregnancy in second trimester --CBC today with 28 week labs - TSH  3. Poor weight gain of pregnancy, second trimester --Pt with 1 lb weight gain, reports low appetite. See labs above.  --Discussed high protein food options including nuts/trail mix, yogurt, and peanut butter.   --Rx for Ensure for [facility] - Ensure (ENSURE); Take 237 mLs by mouth 2 (two) times daily between meals.  Dispense: 237 mL; Refill: 12  Addendum:  [place] faxed response to Rx and does not cover Ensure for poor weight gain.  Without hyperemesis gravidarium or other medical conditions, it is not covered.  Pt encouraged to eat high protein snacks as discussed and can purchase Ensure or generic for Ensure PRN.  Preterm labor symptoms and general obstetric precautions including but not limited to vaginal bleeding, contractions, leaking of fluid and fetal movement were reviewed in detail with the patient. Please refer to After Visit Summary for other counseling recommendations.   Return in about 4 weeks (around 12/25/2019).  Future Appointments  Date Time Provider [place]  12/21/2019  2:15 PM [place] 4 [facility]  12/21/2019  2:20 PM [place] NURSE [place] [facility]  12/25/2019  2:00 PM [place]-[facility], Kathie Dike, CNM [facility] None    Fatima Blank, CNM

## 2019-11-27 NOTE — Patient Instructions (Signed)

## 2019-11-28 LAB — CBC
Hematocrit: 35.3 % (ref 34.0–46.6)
Hemoglobin: 11.7 g/dL (ref 11.1–15.9)
MCH: 28.1 pg (ref 26.6–33.0)
MCHC: 33.1 g/dL (ref 31.5–35.7)
MCV: 85 fL (ref 79–97)
Platelets: 193 10*3/uL (ref 150–450)
RBC: 4.16 x10E6/uL (ref 3.77–5.28)
RDW: 13 % (ref 11.7–15.4)
WBC: 9.1 10*3/uL (ref 3.4–10.8)

## 2019-11-28 LAB — GLUCOSE TOLERANCE, 2 HOURS W/ 1HR
Glucose, 1 hour: 156 mg/dL (ref 65–179)
Glucose, 2 hour: 107 mg/dL (ref 65–152)
Glucose, Fasting: 87 mg/dL (ref 65–91)

## 2019-11-28 LAB — RPR: RPR Ser Ql: NONREACTIVE

## 2019-11-28 LAB — TSH: TSH: 2.64 u[IU]/mL (ref 0.450–4.500)

## 2019-11-28 LAB — HIV ANTIBODY (ROUTINE TESTING W REFLEX): HIV Screen 4th Generation wRfx: NONREACTIVE

## 2019-12-06 ENCOUNTER — Encounter: Payer: Self-pay | Admitting: Obstetrics & Gynecology

## 2019-12-06 ENCOUNTER — Ambulatory Visit (INDEPENDENT_AMBULATORY_CARE_PROVIDER_SITE_OTHER): Payer: PRIVATE HEALTH INSURANCE | Admitting: Obstetrics & Gynecology

## 2019-12-06 ENCOUNTER — Other Ambulatory Visit: Payer: Self-pay

## 2019-12-06 VITALS — BP 111/77 | HR 94

## 2019-12-06 DIAGNOSIS — Z3480 Encounter for supervision of other normal pregnancy, unspecified trimester: Secondary | ICD-10-CM

## 2019-12-06 DIAGNOSIS — O34219 Maternal care for unspecified type scar from previous cesarean delivery: Secondary | ICD-10-CM

## 2019-12-06 DIAGNOSIS — Z3A28 28 weeks gestation of pregnancy: Secondary | ICD-10-CM

## 2019-12-06 NOTE — Progress Notes (Signed)
Pt presents for evaluation for dizziness. She states that she had a dizzy episode at work this am. She did not have breakfast this am. Patient did not pass out. She sat down and drank some water and also ate a small snack. I educated patient on the importance of staying hydrated and eating smaller frequent meals throughout the day. I also educated patient on slow position changes.

## 2019-12-06 NOTE — Progress Notes (Signed)
   PRENATAL VISIT NOTE  Subjective:  Deborah White is a 33 y.o. G2P1001 at [redacted]w[redacted]d being seen today for ongoing prenatal care.  She is currently monitored for the following issues for this low-risk pregnancy and has Breast lump in female; Family history of breast cancer in mother; Obesity affecting pregnancy, antepartum; Supervision of other normal pregnancy, antepartum; and Previous cesarean delivery affecting pregnancy, antepartum on their problem list.  Patient reports Pt had episonde of dizziness at work. She was told that she needed to see her doc before retiurning to work. .   .  .   . Denies leaking of fluid.   The following portions of the patient's history were reviewed and updated as appropriate: allergies, current medications, past family history, past medical history, past social history, past surgical history and problem list.   Objective:  There were no vitals filed for this visit.  Fetal Status:           General:  Alert, oriented and cooperative. Patient is in no acute distress.  Skin: Skin is warm and dry. No rash noted.   Cardiovascular: Normal heart rate noted  Respiratory: Normal respiratory effort, no problems with respiration noted  Abdomen: Soft, gravid, appropriate for gestational age.        Pelvic: Cervical exam deferred        Extremities: Normal range of motion.     Mental Status: Normal mood and affect. Normal behavior. Normal judgment and thought content.   Assessment and Plan:  Pregnancy: G2P1001 at [redacted]w[redacted]d 1. Supervision of other normal pregnancy, antepartum Pt feels fine now. Left home without eating. Felt dizzy. Did not pass out or have additional sx.  This was a problem visit.  2. Previous cesarean delivery affecting pregnancy, antepartum   Preterm labor symptoms and general obstetric precautions including but not limited to vaginal bleeding, contractions, leaking of fluid and fetal movement were reviewed in detail with the patient. Please  refer to After Visit Summary for other counseling recommendations.   Return in about 2 weeks (around 12/20/2019) for in person.  Future Appointments  Date Time Provider [place]  12/21/2019  2:15 PM [place] 4 [facility]  12/21/2019  2:20 PM [place] NURSE [place] [facility]  12/25/2019  2:00 PM [place]-Kir[place], Kathie Dike, CNM [facility] None    Lavonia Drafts, MD

## 2019-12-21 ENCOUNTER — Ambulatory Visit (HOSPITAL_COMMUNITY): Payer: PRIVATE HEALTH INSURANCE

## 2019-12-25 ENCOUNTER — Encounter: Payer: PRIVATE HEALTH INSURANCE | Admitting: Advanced Practice Midwife

## 2020-01-09 ENCOUNTER — Ambulatory Visit (HOSPITAL_COMMUNITY): Payer: PRIVATE HEALTH INSURANCE | Admitting: *Deleted

## 2020-01-09 ENCOUNTER — Other Ambulatory Visit: Payer: Self-pay

## 2020-01-09 ENCOUNTER — Encounter (HOSPITAL_COMMUNITY): Payer: Self-pay | Admitting: *Deleted

## 2020-01-09 ENCOUNTER — Ambulatory Visit (HOSPITAL_COMMUNITY)
Admission: RE | Admit: 2020-01-09 | Discharge: 2020-01-09 | Disposition: A | Discharge status: Home / Self Care | Payer: PRIVATE HEALTH INSURANCE | Source: Ambulatory Visit | Attending: Obstetrics | Admitting: Obstetrics

## 2020-01-09 DIAGNOSIS — Z3A32 32 weeks gestation of pregnancy: Secondary | ICD-10-CM

## 2020-01-09 DIAGNOSIS — O34219 Maternal care for unspecified type scar from previous cesarean delivery: Secondary | ICD-10-CM | POA: Insufficient documentation

## 2020-01-09 DIAGNOSIS — O99213 Obesity complicating pregnancy, third trimester: Secondary | ICD-10-CM | POA: Diagnosis not present

## 2020-01-09 DIAGNOSIS — Z362 Encounter for other antenatal screening follow-up: Secondary | ICD-10-CM

## 2020-01-09 DIAGNOSIS — O359XX0 Maternal care for (suspected) fetal abnormality and damage, unspecified, not applicable or unspecified: Secondary | ICD-10-CM | POA: Diagnosis not present

## 2020-01-09 DIAGNOSIS — Z3480 Encounter for supervision of other normal pregnancy, unspecified trimester: Secondary | ICD-10-CM

## 2020-01-10 ENCOUNTER — Ambulatory Visit (INDEPENDENT_AMBULATORY_CARE_PROVIDER_SITE_OTHER): Payer: PRIVATE HEALTH INSURANCE | Admitting: Certified Nurse Midwife

## 2020-01-10 ENCOUNTER — Encounter: Payer: Self-pay | Admitting: Certified Nurse Midwife

## 2020-01-10 VITALS — BP 117/75 | HR 85 | Wt 214.0 lb

## 2020-01-10 DIAGNOSIS — O34219 Maternal care for unspecified type scar from previous cesarean delivery: Secondary | ICD-10-CM

## 2020-01-10 DIAGNOSIS — O359XX0 Maternal care for (suspected) fetal abnormality and damage, unspecified, not applicable or unspecified: Secondary | ICD-10-CM

## 2020-01-10 DIAGNOSIS — Z3A33 33 weeks gestation of pregnancy: Secondary | ICD-10-CM

## 2020-01-10 DIAGNOSIS — Z3480 Encounter for supervision of other normal pregnancy, unspecified trimester: Secondary | ICD-10-CM

## 2020-01-10 NOTE — Patient Instructions (Addendum)
Vaginal Birth After Cesarean Delivery  Vaginal birth after cesarean delivery (VBAC) is giving birth vaginally after previously delivering a baby through a cesarean section (C-section). A VBAC may be a safe option for you, depending on your health and other factors. It is important to discuss VBAC with your health care provider early in your pregnancy so you can understand the risks, benefits, and options. Having these discussions early will give you time to make your birth plan. Who are the best candidates for VBAC? The best candidates for VBAC are women who:  Have had one or two prior cesarean deliveries, and the incision made during the delivery was horizontal (low transverse).  Do not have a vertical (classical) scar on their uterus.  Have not had a tear in the wall of their uterus (uterine rupture).  Plan to have more pregnancies. A VBAC is also more likely to be successful:  In women who have previously given birth vaginally.  When labor starts by itself (spontaneously) before the due date. What are the benefits of VBAC? The benefits of delivering your baby vaginally instead of by a cesarean delivery include:  A shorter hospital stay.  A faster recovery time.  Less pain.  Avoiding risks associated with major surgery, such as infection and blood clots.  Less blood loss and less need for donated blood (transfusions). What are the risks of VBAC? The main risk of attempting a VBAC is that it may fail, forcing your health care provider to deliver your baby by a C-section. Other risks are rare and include:  Tearing (rupture) of the scar from a past cesarean delivery.  Other risks associated with vaginal deliveries. If a repeat cesarean delivery is needed, the risks include:  Blood loss.  Infection.  Blood clot.  Damage to surrounding organs.  Removal of the uterus (hysterectomy), if it is damaged.  Placenta problems in future pregnancies. What else should I know  about my options? Delivering a baby through a VBAC is similar to having a normal spontaneous vaginal delivery. Therefore, it is safe:  To try with twins.  For your health care provider to try to turn the baby from a breech position (external cephalic version) during labor.  With epidural analgesia for pain relief. Consider where you would like to deliver your baby. VBAC should be attempted in facilities where an emergency cesarean delivery can be performed. VBAC is not recommended for home births. Any changes in your health or your baby's health during your pregnancy may make it necessary to change your initial decision about VBAC. Your health care provider may recommend that you do not attempt a VBAC if:  Your baby's suspected weight is 8.8 lb (4 kg) or more.  You have preeclampsia. This is a condition that causes high blood pressure along with other symptoms, such as swelling and headaches.  You will have VBAC less than 19 months after your cesarean delivery.  You are past your due date.  You need to have labor started (induced) because your cervix is not ready for labor (unfavorable). Where to find more information  American Pregnancy Association: americanpregnancy.org  American Congress of Obstetricians and Gynecologists: acog.org Summary  Vaginal birth after cesarean delivery (VBAC) is giving birth vaginally after previously delivering a baby through a cesarean section (C-section). A VBAC may be a safe option for you, depending on your health and other factors.  Discuss VBAC with your health care provider early in your pregnancy so you can understand the risks, benefits, options, and   have plenty of time to make your birth plan.  The main risk of attempting a VBAC is that it may fail, forcing your health care provider to deliver your baby by a C-section. Other risks are rare. This information is not intended to replace advice given to you by your health care provider. Make sure  you discuss any questions you have with your health care provider. Document Revised: 03/21/2019 Document Reviewed: 03/02/2017 Elsevier Patient Education  [place] of Pregnancy  The third trimester is from week 28 through week 40 (months 7 through 9). This trimester is when your unborn baby (fetus) is growing very fast. At the end of the ninth month, the unborn baby is about 20 inches in length. It weighs about 6-10 pounds. Follow these instructions at home: Medicines  Take over-the-counter and prescription medicines only as told by your doctor. Some medicines are safe and some medicines are not safe during pregnancy.  Take a prenatal vitamin that contains at least 600 micrograms (mcg) of folic acid.  If you have trouble pooping (constipation), take medicine that will make your stool soft (stool softener) if your doctor approves. Eating and drinking   Eat regular, healthy meals.  Avoid raw meat and uncooked cheese.  If you get low calcium from the food you eat, talk to your doctor about taking a daily calcium supplement.  Eat four or five small meals rather than three large meals a day.  Avoid foods that are high in fat and sugars, such as fried and sweet foods.  To prevent constipation: ? Eat foods that are high in fiber, like fresh fruits and vegetables, whole grains, and beans. ? Drink enough fluids to keep your pee (urine) clear or pale yellow. Activity  Exercise only as told by your doctor. Stop exercising if you start to have cramps.  Avoid heavy lifting, wear low heels, and sit up straight.  Do not exercise if it is too hot, too humid, or if you are in a place of great height (high altitude).  You may continue to have sex unless your doctor tells you not to. Relieving pain and discomfort  Wear a good support bra if your breasts are tender.  Take frequent breaks and rest with your legs raised if you have leg cramps or low back  pain.  Take warm water baths (sitz baths) to soothe pain or discomfort caused by hemorrhoids. Use hemorrhoid cream if your doctor approves.  If you develop puffy, bulging veins (varicose veins) in your legs: ? Wear support hose or compression stockings as told by your doctor. ? Raise (elevate) your feet for 15 minutes, 3-4 times a day. ? Limit salt in your food. Safety  Wear your seat belt when driving.  Make a list of emergency phone numbers, including numbers for family, friends, the hospital, and police and fire departments. Preparing for your baby's arrival To prepare for the arrival of your baby:  Take prenatal classes.  Practice driving to the hospital.  Visit the hospital and tour the maternity area.  Talk to your work about taking leave once the baby comes.  Pack your hospital bag.  Prepare the baby's room.  Go to your doctor visits.  Buy a rear-facing car seat. Learn how to install it in your car. General instructions  Do not use hot tubs, steam rooms, or saunas.  Do not use any products that contain nicotine or tobacco, such as cigarettes and e-cigarettes. If you need help  quitting, ask your doctor.  Do not drink alcohol.  Do not douche or use tampons or scented sanitary pads.  Do not cross your legs for long periods of time.  Do not travel for long distances unless you must. Only do so if your doctor says it is okay.  Visit your dentist if you have not gone during your pregnancy. Use a soft toothbrush to brush your teeth. Be gentle when you floss.  Avoid cat litter boxes and soil used by cats. These carry germs that can cause birth defects in the baby and can cause a loss of your baby (miscarriage) or stillbirth.  Keep all your prenatal visits as told by your doctor. This is important. Contact a doctor if:  You are not sure if you are in labor or if your water has broken.  You are dizzy.  You have mild cramps or pressure in your lower belly.  You  have a nagging pain in your belly area.  You continue to feel sick to your stomach, you throw up, or you have watery poop.  You have bad smelling fluid coming from your vagina.  You have pain when you pee. Get help right away if:  You have a fever.  You are leaking fluid from your vagina.  You are spotting or bleeding from your vagina.  You have severe belly cramps or pain.  You lose or gain weight quickly.  You have trouble catching your breath and have chest pain.  You notice sudden or extreme puffiness (swelling) of your face, hands, ankles, feet, or legs.  You have not felt the baby move in over an hour.  You have severe headaches that do not go away with medicine.  You have trouble seeing.  You are leaking, or you are having a gush of fluid, from your vagina before you are 37 weeks.  You have regular belly spasms (contractions) before you are 37 weeks. Summary  The third trimester is from week 28 through week 40 (months 7 through 9). This time is when your unborn baby is growing very fast.  Follow your doctor's advice about medicine, food, and activity.  Get ready for the arrival of your baby by taking prenatal classes, getting all the baby items ready, preparing the baby's room, and visiting your doctor to be checked.  Get help right away if you are bleeding from your vagina, or you have chest pain and trouble catching your breath, or if you have not felt your baby move in over an hour. This information is not intended to replace advice given to you by your health care provider. Make sure you discuss any questions you have with your health care provider. Document Revised: 03/16/2019 Document Reviewed: 12/29/2016 Elsevier Patient Education  2020 [employer].

## 2020-01-10 NOTE — Progress Notes (Signed)
ROB   CC: None    

## 2020-01-10 NOTE — Progress Notes (Signed)
   PRENATAL VISIT NOTE  Subjective:  Deborah White is a 34 y.o. G2P1001 at [redacted]w[redacted]d being seen today for ongoing prenatal care.  She is currently monitored for the following issues for this low-risk pregnancy and has Breast lump in female; Family history of breast cancer in mother; Obesity affecting pregnancy, antepartum; Supervision of other normal pregnancy, antepartum; Previous cesarean delivery affecting pregnancy, antepartum; and Fetal abnormality affecting management of mother, antepartum on their problem list.  Patient reports no complaints.  Contractions: Irritability. Vag. Bleeding: None.  Movement: Present. Denies leaking of fluid.   The following portions of the patient's history were reviewed and updated as appropriate: allergies, current medications, past family history, past medical history, past social history, past surgical history and problem list.   Objective:   Vitals:   01/10/20 1335  BP: 117/75  Pulse: 85  Weight: 214 lb (97.1 kg)    Fetal Status: Fetal Heart Rate (bpm): 160 Fundal Height: 34 cm Movement: Present     General:  Alert, oriented and cooperative. Patient is in no acute distress.  Skin: Skin is warm and dry. No rash noted.   Cardiovascular: Normal heart rate noted  Respiratory: Normal respiratory effort, no problems with respiration noted  Abdomen: Soft, gravid, appropriate for gestational age.  Pain/Pressure: Absent     Pelvic: Cervical exam deferred        Extremities: Normal range of motion.  Edema: None  Mental Status: Normal mood and affect. Normal behavior. Normal judgment and thought content.   Assessment and Plan:  Pregnancy: G2P1001 at [redacted]w[redacted]d 1. Supervision of other normal pregnancy, antepartum - Patient doing well, no complaints - Routine prenatal care - Anticipatory guidance on upcoming appointments with GBS being obtained at next appointment, discussed with patient recommendations of antibiotics if GBS positive  - Discussed with  patient weight gain during pregnancy  - Patient reports occasional fatigue d/t not sleeping through the night because husband and newborn in bed with patient. Discussed with patient this can be normal especially in the third trimester. Encouraged patient to move to different room to sleep if that is possible so that patient can get enough rest.   2. Previous cesarean delivery affecting pregnancy, antepartum - Educated and discussed with patient repeat C/S vs TOLAC  - Patient request TOLAC  - Discussed with patient that she will meet with MD at next appointment to discuss risks/benefits and answer additional questions prior to signing VBAC consent   3. Fetal abnormality affecting management of mother, antepartum, single or unspecified fetus - MFM noted continued left pyelectasis on follow up US yesterday  - fetal growth and AFV WNL  - Was told to notify pediatrician after delivery so that additional imaging of baby's kidney can be obtained if needed  - No contraindications to delivery  - Answered patient's questions as it pertains to this abnormality    Preterm labor symptoms and general obstetric precautions including but not limited to vaginal bleeding, contractions, leaking of fluid and fetal movement were reviewed in detail with the patient. Please refer to After Visit Summary for other counseling recommendations.   Return in about 3 weeks (around 01/31/2020) for ROB/GBS - with MD to sign VBAC consent .  Future Appointments  Date Time Provider Department Center  01/31/2020  1:00 PM Constant, Gigi Gin, MD [facility] None    Sharyon Cable, CNM

## 2020-01-31 ENCOUNTER — Encounter: Payer: Self-pay | Admitting: Obstetrics

## 2020-01-31 ENCOUNTER — Other Ambulatory Visit: Payer: Self-pay

## 2020-01-31 ENCOUNTER — Ambulatory Visit (INDEPENDENT_AMBULATORY_CARE_PROVIDER_SITE_OTHER): Payer: PRIVATE HEALTH INSURANCE | Admitting: Obstetrics and Gynecology

## 2020-01-31 ENCOUNTER — Encounter: Payer: Self-pay | Admitting: Obstetrics and Gynecology

## 2020-01-31 VITALS — BP 122/78 | HR 98 | Wt 217.0 lb

## 2020-01-31 DIAGNOSIS — B9689 Other specified bacterial agents as the cause of diseases classified elsewhere: Secondary | ICD-10-CM

## 2020-01-31 DIAGNOSIS — Z3480 Encounter for supervision of other normal pregnancy, unspecified trimester: Secondary | ICD-10-CM

## 2020-01-31 DIAGNOSIS — N760 Acute vaginitis: Secondary | ICD-10-CM | POA: Diagnosis not present

## 2020-01-31 DIAGNOSIS — Z113 Encounter for screening for infections with a predominantly sexual mode of transmission: Secondary | ICD-10-CM | POA: Diagnosis not present

## 2020-01-31 DIAGNOSIS — B373 Candidiasis of vulva and vagina: Secondary | ICD-10-CM | POA: Diagnosis not present

## 2020-01-31 DIAGNOSIS — N898 Other specified noninflammatory disorders of vagina: Secondary | ICD-10-CM

## 2020-01-31 DIAGNOSIS — O99210 Obesity complicating pregnancy, unspecified trimester: Secondary | ICD-10-CM

## 2020-01-31 DIAGNOSIS — O34219 Maternal care for unspecified type scar from previous cesarean delivery: Secondary | ICD-10-CM

## 2020-01-31 DIAGNOSIS — Z3A36 36 weeks gestation of pregnancy: Secondary | ICD-10-CM

## 2020-01-31 DIAGNOSIS — O99213 Obesity complicating pregnancy, third trimester: Secondary | ICD-10-CM

## 2020-01-31 DIAGNOSIS — O359XX0 Maternal care for (suspected) fetal abnormality and damage, unspecified, not applicable or unspecified: Secondary | ICD-10-CM

## 2020-01-31 NOTE — Patient Instructions (Signed)
Etonogestrel implant What is this medicine? ETONOGESTREL (et oh noe JES trel) is a contraceptive (birth control) device. It is used to prevent pregnancy. It can be used for up to 3 years. This medicine may be used for other purposes; ask your health care provider or pharmacist if you have questions. COMMON BRAND NAME(S): Implanon, Nexplanon What should I tell my health care provider before I take this medicine? They need to know if you have any of these conditions:  abnormal vaginal bleeding  blood vessel disease or blood clots  breast, cervical, endometrial, ovarian, liver, or uterine cancer  diabetes  gallbladder disease  heart disease or recent heart attack  high blood pressure  high cholesterol or triglycerides  kidney disease  liver disease  migraine headaches  seizures  stroke  tobacco smoker  an unusual or allergic reaction to etonogestrel, anesthetics or antiseptics, other medicines, foods, dyes, or preservatives  pregnant or trying to get pregnant  breast-feeding How should I use this medicine? This device is inserted just under the skin on the inner side of your upper arm by a health care professional. Talk to your pediatrician regarding the use of this medicine in children. Special care may be needed. Overdosage: If you think you have taken too much of this medicine contact a poison control center or emergency room at once. NOTE: This medicine is only for you. Do not share this medicine with others. What if I miss a dose? This does not apply. What may interact with this medicine? Do not take this medicine with any of the following medications:  amprenavir  fosamprenavir This medicine may also interact with the following medications:  acitretin  aprepitant  armodafinil  bexarotene  bosentan  carbamazepine  certain medicines for fungal infections like fluconazole, ketoconazole, itraconazole and voriconazole  certain medicines to treat  hepatitis, HIV or AIDS  cyclosporine  felbamate  griseofulvin  lamotrigine  modafinil  oxcarbazepine  phenobarbital  phenytoin  primidone  rifabutin  rifampin  rifapentine  St. John's wort  topiramate This list may not describe all possible interactions. Give your health care provider a list of all the medicines, herbs, non-prescription drugs, or dietary supplements you use. Also tell them if you smoke, drink alcohol, or use illegal drugs. Some items may interact with your medicine. What should I watch for while using this medicine? This product does not protect you against HIV infection (AIDS) or other sexually transmitted diseases. You should be able to feel the implant by pressing your fingertips over the skin where it was inserted. Contact your doctor if you cannot feel the implant, and use a non-hormonal birth control method (such as condoms) until your doctor confirms that the implant is in place. Contact your doctor if you think that the implant may have broken or become bent while in your arm. You will receive a user card from your health care provider after the implant is inserted. The card is a record of the location of the implant in your upper arm and when it should be removed. Keep this card with your health records. What side effects may I notice from receiving this medicine? Side effects that you should report to your doctor or health care professional as soon as possible:  allergic reactions like skin rash, itching or hives, swelling of the face, lips, or tongue  breast lumps, breast tissue changes, or discharge  breathing problems  changes in emotions or moods  coughing up blood  if you feel that the implant   may have broken or bent while in your arm  high blood pressure  pain, irritation, swelling, or bruising at the insertion site  scar at site of insertion  signs of infection at the insertion site such as fever, and skin redness, pain or  discharge  signs and symptoms of a blood clot such as breathing problems; changes in vision; chest pain; severe, sudden headache; pain, swelling, warmth in the leg; trouble speaking; sudden numbness or weakness of the face, arm or leg  signs and symptoms of liver injury like dark yellow or brown urine; general ill feeling or flu-like symptoms; light-colored stools; loss of appetite; nausea; right upper belly pain; unusually weak or tired; yellowing of the eyes or skin  unusual vaginal bleeding, discharge Side effects that usually do not require medical attention (report to your doctor or health care professional if they continue or are bothersome):  acne  breast pain or tenderness  headache  irregular menstrual bleeding  nausea This list may not describe all possible side effects. Call your doctor for medical advice about side effects. You may report side effects to FDA at 1-800-FDA-1088. Where should I keep my medicine? This drug is given in a hospital or clinic and will not be stored at home. NOTE: This sheet is a summary. It may not cover all possible information. If you have questions about this medicine, talk to your doctor, pharmacist, or health care provider.  2020 Elsevier/Gold Standard (2019-09-05 11:33:04)  

## 2020-01-31 NOTE — Progress Notes (Signed)
   PRENATAL VISIT NOTE  Subjective:  Deborah White is a 34 y.o. G2P1001 at [redacted]w[redacted]d being seen today for ongoing prenatal care.  She is currently monitored for the following issues for this low-risk pregnancy and has Breast lump in female; Family history of breast cancer in mother; Obesity affecting pregnancy, antepartum; Supervision of other normal pregnancy, antepartum; Previous cesarean delivery affecting pregnancy, antepartum; and Fetal abnormality affecting management of mother, antepartum on their problem list.  Patient reports pruritic discharge.  Contractions: Irregular. Vag. Bleeding: None.  Movement: Present. Denies leaking of fluid.   The following portions of the patient's history were reviewed and updated as appropriate: allergies, current medications, past family history, past medical history, past social history, past surgical history and problem list.   Objective:   Vitals:   01/31/20 1303  BP: 122/78  Pulse: 98  Weight: 217 lb (98.4 kg)    Fetal Status: Fetal Heart Rate (bpm): 150 Fundal Height: 36 cm Movement: Present  Presentation: Vertex  General:  Alert, oriented and cooperative. Patient is in no acute distress.  Skin: Skin is warm and dry. No rash noted.   Cardiovascular: Normal heart rate noted  Respiratory: Normal respiratory effort, no problems with respiration noted  Abdomen: Soft, gravid, appropriate for gestational age.  Pain/Pressure: Present     Pelvic: Cervical exam performed Dilation: Closed Effacement (%): Thick Station: Ballotable  Extremities: Normal range of motion.     Mental Status: Normal mood and affect. Normal behavior. Normal judgment and thought content.   Assessment and Plan:  Pregnancy: G2P1001 at [redacted]w[redacted]d 1. Supervision of other normal pregnancy, antepartum Patient is doing well Cultures today - Strep Gp B NAA - Cervicovaginal ancillary only( [place])  2. Previous cesarean delivery affecting pregnancy, antepartum Desires  TOLAC Consent signed  3. Obesity affecting pregnancy, antepartum   4. Fetal abnormality affecting management of mother, antepartum, single or unspecified fetus Follow up post natal regarding pyelectasis  Preterm labor symptoms and general obstetric precautions including but not limited to vaginal bleeding, contractions, leaking of fluid and fetal movement were reviewed in detail with the patient. Please refer to After Visit Summary for other counseling recommendations.   Return in about 1 week (around 02/07/2020) for in person, ROB, Low risk.  Future Appointments  Date Time Provider Department Center  02/07/2020  3:30 PM Tylin Force, Gigi Gin, MD [facility] None    Catalina Antigua, MD

## 2020-01-31 NOTE — Progress Notes (Signed)
Pt states having watery d/c and vaginal itching.

## 2020-02-01 LAB — CERVICOVAGINAL ANCILLARY ONLY
Bacterial Vaginitis (gardnerella): POSITIVE — AB
Candida Glabrata: NEGATIVE
Candida Vaginitis: POSITIVE — AB
Chlamydia: NEGATIVE
Comment: NEGATIVE
Comment: NEGATIVE
Comment: NEGATIVE
Comment: NEGATIVE
Comment: NEGATIVE
Comment: NORMAL
Neisseria Gonorrhea: NEGATIVE
Trichomonas: NEGATIVE

## 2020-02-01 MED ORDER — TERCONAZOLE 0.8 % VA CREA: 1.0000 | TOPICAL_CREAM | Freq: Every day | VAGINAL | 0 refills | Status: DC

## 2020-02-01 MED ORDER — METRONIDAZOLE 500 MG PO TABS: 500.0000 mg | ORAL_TABLET | Freq: Two times a day (BID) | ORAL | 0 refills | Status: DC

## 2020-02-01 NOTE — Addendum Note (Signed)
Addended by: Catalina Antigua on: 02/01/2020 04:27 PM   Modules accepted: Orders

## 2020-02-02 LAB — STREP GP B NAA: Strep Gp B NAA: POSITIVE — AB

## 2020-02-05 ENCOUNTER — Encounter: Payer: Self-pay | Admitting: Obstetrics and Gynecology

## 2020-02-05 DIAGNOSIS — O99820 Streptococcus B carrier state complicating pregnancy: Secondary | ICD-10-CM | POA: Insufficient documentation

## 2020-02-05 HISTORY — DX: Streptococcus B carrier state complicating pregnancy: O99.820

## 2020-02-07 ENCOUNTER — Other Ambulatory Visit: Payer: Self-pay

## 2020-02-07 ENCOUNTER — Ambulatory Visit (INDEPENDENT_AMBULATORY_CARE_PROVIDER_SITE_OTHER): Payer: BLUE CROSS/BLUE SHIELD | Admitting: Obstetrics and Gynecology

## 2020-02-07 ENCOUNTER — Encounter: Payer: Self-pay | Admitting: Obstetrics and Gynecology

## 2020-02-07 VITALS — BP 113/72 | HR 98 | Wt 217.0 lb

## 2020-02-07 DIAGNOSIS — Z3A37 37 weeks gestation of pregnancy: Secondary | ICD-10-CM

## 2020-02-07 DIAGNOSIS — O99210 Obesity complicating pregnancy, unspecified trimester: Secondary | ICD-10-CM

## 2020-02-07 DIAGNOSIS — O359XX0 Maternal care for (suspected) fetal abnormality and damage, unspecified, not applicable or unspecified: Secondary | ICD-10-CM

## 2020-02-07 DIAGNOSIS — O99213 Obesity complicating pregnancy, third trimester: Secondary | ICD-10-CM

## 2020-02-07 DIAGNOSIS — B951 Streptococcus, group B, as the cause of diseases classified elsewhere: Secondary | ICD-10-CM

## 2020-02-07 DIAGNOSIS — O99820 Streptococcus B carrier state complicating pregnancy: Secondary | ICD-10-CM

## 2020-02-07 DIAGNOSIS — Z3480 Encounter for supervision of other normal pregnancy, unspecified trimester: Secondary | ICD-10-CM

## 2020-02-07 DIAGNOSIS — O34219 Maternal care for unspecified type scar from previous cesarean delivery: Secondary | ICD-10-CM

## 2020-02-07 NOTE — Progress Notes (Signed)
Patient reports fetal movement with irregular contractions. 

## 2020-02-07 NOTE — Progress Notes (Signed)
   PRENATAL VISIT NOTE  Subjective:  Deborah White is a 34 y.o. G2P1001 at [redacted]w[redacted]d being seen today for ongoing prenatal care.  She is currently monitored for the following issues for this low-risk pregnancy and has Breast lump in female; Family history of breast cancer in mother; Obesity affecting pregnancy, antepartum; Supervision of other normal pregnancy, antepartum; Previous cesarean delivery affecting pregnancy, antepartum; Fetal abnormality affecting management of mother, antepartum; and GBS (group B Streptococcus carrier), +RV culture, currently pregnant on their problem list.  Patient reports no complaints.  Contractions: Irregular. Vag. Bleeding: None.  Movement: Present. Denies leaking of fluid.   The following portions of the patient's history were reviewed and updated as appropriate: allergies, current medications, past family history, past medical history, past social history, past surgical history and problem list.   Objective:   Vitals:   02/07/20 1527  BP: 113/72  Pulse: 98  Weight: 217 lb (98.4 kg)    Fetal Status: Fetal Heart Rate (bpm): 154 Fundal Height: 37 cm Movement: Present     General:  Alert, oriented and cooperative. Patient is in no acute distress.  Skin: Skin is warm and dry. No rash noted.   Cardiovascular: Normal heart rate noted  Respiratory: Normal respiratory effort, no problems with respiration noted  Abdomen: Soft, gravid, appropriate for gestational age.  Pain/Pressure: Present     Pelvic: Cervical exam deferred        Extremities: Normal range of motion.  Edema: None  Mental Status: Normal mood and affect. Normal behavior. Normal judgment and thought content.   Assessment and Plan:  Pregnancy: G2P1001 at [redacted]w[redacted]d 1. Supervision of other normal pregnancy, antepartum Patient is doing well without complaints   2. Previous cesarean delivery affecting pregnancy, antepartum Plans TOLAC  3. Obesity affecting pregnancy, antepartum   4.  GBS (group B Streptococcus carrier), +RV culture, currently pregnant Prophylaxis in labor  5. Fetal abnormality affecting management of mother, antepartum, single or unspecified fetus Renal pyelectasis seen with plans to follow up post natal  Term labor symptoms and general obstetric precautions including but not limited to vaginal bleeding, contractions, leaking of fluid and fetal movement were reviewed in detail with the patient. Please refer to After Visit Summary for other counseling recommendations.   Return in about 1 week (around 02/14/2020) for in person, ROB, Low risk.  No future appointments.  Catalina Antigua, MD

## 2020-02-15 ENCOUNTER — Other Ambulatory Visit: Payer: Self-pay

## 2020-02-15 ENCOUNTER — Ambulatory Visit (INDEPENDENT_AMBULATORY_CARE_PROVIDER_SITE_OTHER): Payer: BLUE CROSS/BLUE SHIELD | Admitting: Advanced Practice Midwife

## 2020-02-15 VITALS — BP 110/73 | HR 102 | Wt 221.0 lb

## 2020-02-15 DIAGNOSIS — Z3480 Encounter for supervision of other normal pregnancy, unspecified trimester: Secondary | ICD-10-CM

## 2020-02-15 DIAGNOSIS — O34219 Maternal care for unspecified type scar from previous cesarean delivery: Secondary | ICD-10-CM

## 2020-02-15 DIAGNOSIS — N898 Other specified noninflammatory disorders of vagina: Secondary | ICD-10-CM

## 2020-02-15 DIAGNOSIS — O26893 Other specified pregnancy related conditions, third trimester: Secondary | ICD-10-CM

## 2020-02-15 DIAGNOSIS — Z3A38 38 weeks gestation of pregnancy: Secondary | ICD-10-CM

## 2020-02-15 DIAGNOSIS — O99820 Streptococcus B carrier state complicating pregnancy: Secondary | ICD-10-CM

## 2020-02-15 NOTE — Progress Notes (Signed)
   PRENATAL VISIT NOTE  Subjective:  Deborah White is a 34 y.o. G2P1001 at [redacted]w[redacted]d being seen today for ongoing prenatal care.  She is currently monitored for the following issues for this low-risk pregnancy and has Breast lump in female; Family history of breast cancer in mother; Obesity affecting pregnancy, antepartum; Supervision of other normal pregnancy, antepartum; Previous cesarean delivery affecting pregnancy, antepartum; Fetal abnormality affecting management of mother, antepartum; and GBS (group B Streptococcus carrier), +RV culture, currently pregnant on their problem list.  Patient reports occasional contractions.  Contractions: Irregular. Vag. Bleeding: None.  Movement: Present. Denies leaking of fluid.   The following portions of the patient's history were reviewed and updated as appropriate: allergies, current medications, past family history, past medical history, past social history, past surgical history and problem list.   Objective:   Vitals:   02/15/20 1345  BP: 110/73  Pulse: (!) 102  Weight: 221 lb (100.2 kg)    Fetal Status: Fetal Heart Rate (bpm): 157 Fundal Height: 37 cm Movement: Present     General:  Alert, oriented and cooperative. Patient is in no acute distress.  Skin: Skin is warm and dry. No rash noted.   Cardiovascular: Normal heart rate noted  Respiratory: Normal respiratory effort, no problems with respiration noted  Abdomen: Soft, gravid, appropriate for gestational age.  Pain/Pressure: Absent     Pelvic: Cervical exam deferred        Extremities: Normal range of motion.  Edema: None  Mental Status: Normal mood and affect. Normal behavior. Normal judgment and thought content.   Assessment and Plan:  Pregnancy: G2P1001 at [redacted]w[redacted]d 1. Supervision of other normal pregnancy, antepartum --Anticipatory guidance about next visits/weeks of pregnancy given. --Next visit in 1 week in office  2. Previous cesarean delivery affecting pregnancy,  antepartum --Desires TOLAC  3. GBS (group B Streptococcus carrier), +RV culture, currently pregnant --Needs prophylaxis in labor  4. Vaginal discharge during pregnancy, antepartum --Pt with thin vaginal discharge with odor.  Has Rx for Flagyl but has not started, wants to discuss safety, need for treatment in pregnancy.  Pt may choose to treat as the medication is safe, but may want to wait if symptoms are minor and see if she needs treatment postpartum.  Term labor symptoms and general obstetric precautions including but not limited to vaginal bleeding, contractions, leaking of fluid and fetal movement were reviewed in detail with the patient. Please refer to After Visit Summary for other counseling recommendations.   Return in about 1 week (around 02/22/2020).  Future Appointments  Date Time Provider Department Center  02/22/2020  1:40 PM Gerrit Heck, CNM [facility] None    Sharen Counter, CNM

## 2020-02-15 NOTE — Patient Instructions (Addendum)
Labor Precautions Reasons to come to [facility] at [facility]:  1.  Contractions are  5 minutes apart or less, each last 1 minute, these have been going on for 1-2 hours, and you cannot walk or talk during them 2.  You have a large gush of fluid, or a trickle of fluid that will not stop and you have to wear a pad 3.  You have bleeding that is bright red, heavier than spotting--like menstrual bleeding (spotting can be normal in early labor or after a check of your cervix) 4.  You do not feel the baby moving like he/she normally does    Some natural remedies/prevention to try for bacterial vaginosis: --Take a probiotic tablet/capsule every day for at least 1-2 months.   --Whenever you have symptoms, use boric acid suppositories vaginally every night for a week.   --Do not use scented soaps/perfumes in the vaginal area, and do not overwash multiple times daily. --Wear breathable cotton underwear and do not wear tight restrictive clothing. --Limit pantyliner use, change your underwear several times daily instead. --Use condoms during intercourse.  Your Home Program  General Guidelines for Pelvic Floor Exercise  Challenge your muscles to do more than they are used to doing. The quality of the exercise is more important that the number you perform.  Avoid straining, holding your breath or using buttock or leg muscles while you exercise the pelvic floor muscles.  Count out loud and continue breathing to avoid straining.  Relax your body and breathe during your exercises.  Coordinate your breathing with your pelvic floor contraction by blowing out or exhaling while you contract your pelvic floor muscles.   Concentrate on activating both the sphincters and levator ani muscles of the pelvic floor with each exercise.  Position for the Exercises  Start lying down with your knees bent and supported with pillows.  Once you've gained awareness and can feel the contractions you  may perform the exercises either sitting or standing.  For example, you can do them while driving, working on the computer, or waiting in lines.  Quick Contractions  Repeat this exercise 10 times.  Do the exercise 2 times per day.  Rapidly contract your pelvic floor muscles and hold for 2 seconds relax for 2 seconds.  Try to do the contraction on breathing exhalation.  Endurance Contractions  Repeat this 10 times.  Do the exercise 2 times per day.  Pull your pelvic floor muscles up and in and hold for 5-10 seconds then relax for 10 seconds.  Count out loud while you are holding the contraction to make sure that you are breathing throughout the exercise and not straining.  Other Exercises/Instructions    2007, [employer] Doc.37   Pelvic Floor Dysfunction  Pelvic floor dysfunction (PFD) is a condition that results when the group of muscles and connective tissues that support the organs in the pelvis (pelvic floor muscles) do not work well. These muscles and their connections form a sling that supports the colon and bladder. In men, these muscles also support the prostate gland. In women, they also support the uterus. PFD causes pelvic floor muscles to be too weak, too tight, or a combination of both. In PFD, muscle movements are not coordinated. This condition may cause bowel or bladder problems. It may also cause pain. What are the causes? This condition may be caused by an injury to the pelvic area or by a weakening of pelvic muscles. This often results  from pregnancy and childbirth or other types of strain. In many cases, the exact cause is not known. What increases the risk? The following factors may make you more likely to develop this condition:  Having a condition of chronic bladder tissue inflammation (interstitial cystitis).  Being an older person.  Being overweight.  Radiation treatment for cancer in the pelvic region.  Previous pelvic surgery, such as  removal of the uterus (hysterectomy) or prostate gland (prostatectomy). What are the signs or symptoms? Symptoms of this condition vary and may include:  Bladder symptoms, such as: ? Trouble starting urination and emptying the bladder. ? Frequent urinary tract infections. ? Leaking urine when coughing, laughing, or exercising (stress incontinence). ? Having to pass urine urgently or frequently. ? Pain when passing urine.  Bowel symptoms, such as: ? Constipation. ? Urgent or frequent bowel movements. ? Incomplete bowel movements. ? Painful bowel movements. ? Leaking stool or gas.  Unexplained genital or rectal pain.  Genital or rectal muscle spasms.  Low back pain. In women, symptoms of PFD may also include:  A heavy, full, or aching feeling in the vagina.  A bulge that protrudes into the vagina.  Pain during or after sexual intercourse. How is this diagnosed? This condition may be diagnosed based on:  Your symptoms and medical history.  A physical exam. During the exam, your health care provider may check your pelvic muscles for tightness, spasm, pain, or weakness. This may include a rectal exam and a pelvic exam for women. In some cases, you may have diagnostic tests, such as:  Electrical muscle function tests.  Urine flow testing.  X-ray tests of bowel function.  Ultrasound of the pelvic organs. How is this treated? Treatment for this condition depends on your symptoms. Treatment options include:  Physical therapy. This may include Kegel exercises to help relax or strengthen the pelvic floor muscles.  Biofeedback. This type of therapy provides feedback on how tight your pelvic floor muscles are so that you can learn to control them.  Internal or external massage therapy.  A treatment that involves electrical stimulation of the pelvic floor muscles to help control pain (transcutaneous electrical nerve stimulation, or TENS).  Sound wave therapy (ultrasound) to  reduce muscle spasms.  Medicines, such as: ? Muscle relaxants. ? Bladder control medicines. Surgery to reconstruct or support pelvic floor muscles may be an option if other treatments do not help. Follow these instructions at home: Activity  Do your usual activities as told by your health care provider. Ask your health care provider if you should modify any activities.  Do pelvic floor strengthening or relaxing exercises at home as told by your physical therapist. Lifestyle  Maintain a healthy weight.  Eat foods that are high in fiber, such as beans, whole grains, and fresh fruits and vegetables.  Limit foods that are high in fat and processed sugars, such as fried or sweet foods.  Manage stress with relaxation techniques such as yoga or meditation. General instructions  If you have problems with leakage: ? Use absorbable pads or wear padded underwear. ? Wash frequently with mild soap. ? Keep your genital and anal area as clean and dry as possible. ? Ask your health care provider if you should try a barrier cream to prevent skin irritation.  Take warm baths to relieve pelvic muscle tension or spasms.  Take over-the-counter and prescription medicines only as told by your health care provider.  Keep all follow-up visits as told by your health care provider.  This is important. Contact a health care provider if you:  Are not improving with home care.  Have signs or symptoms of PFD that get worse at home.  Develop new signs or symptoms at home.  Have signs of a urinary tract infection, such as: ? Fever. ? Chills. ? Urinary frequency. ? A burning feeling when urinating.  Have not had a bowel movement in 3 days (constipation). Summary  Pelvic floor dysfunction results when the muscles and connective tissues in your pelvic floor do not work well.  These muscles and their connections form a sling that supports your colon and bladder. In men, these muscles also support the  prostate gland. In women, they also support the uterus.  PFD may be caused by an injury to the pelvic area or by a weakening of pelvic muscles.  PFD causes pelvic floor muscles to be too weak, too tight, or a combination of both. Symptoms may vary from person to person.  In most cases, PFD can be treated with physical therapies and medicines. Surgery may be an option if other treatments do not help. This information is not intended to replace advice given to you by your health care provider. Make sure you discuss any questions you have with your health care provider. Document Revised: 06/13/2018 Document Reviewed: 06/13/2018 Elsevier Patient Education  [place].

## 2020-02-15 NOTE — Progress Notes (Signed)
ROB.  Reports no problems today. 

## 2020-02-27 ENCOUNTER — Encounter (HOSPITAL_COMMUNITY): Payer: Self-pay | Admitting: *Deleted

## 2020-02-27 ENCOUNTER — Telehealth (HOSPITAL_COMMUNITY): Payer: Self-pay | Admitting: *Deleted

## 2020-02-27 ENCOUNTER — Other Ambulatory Visit: Payer: Self-pay

## 2020-02-27 ENCOUNTER — Ambulatory Visit (INDEPENDENT_AMBULATORY_CARE_PROVIDER_SITE_OTHER): Payer: BLUE CROSS/BLUE SHIELD | Admitting: Family Medicine

## 2020-02-27 VITALS — BP 110/73 | HR 102 | Wt 224.0 lb

## 2020-02-27 DIAGNOSIS — O99820 Streptococcus B carrier state complicating pregnancy: Secondary | ICD-10-CM

## 2020-02-27 DIAGNOSIS — O34219 Maternal care for unspecified type scar from previous cesarean delivery: Secondary | ICD-10-CM

## 2020-02-27 DIAGNOSIS — Z3480 Encounter for supervision of other normal pregnancy, unspecified trimester: Secondary | ICD-10-CM

## 2020-02-27 DIAGNOSIS — Z3A39 39 weeks gestation of pregnancy: Secondary | ICD-10-CM

## 2020-02-27 NOTE — Telephone Encounter (Signed)
Preadmission screen  

## 2020-02-27 NOTE — Progress Notes (Signed)
ROB.  Reports no problems today. 

## 2020-02-27 NOTE — Progress Notes (Signed)
   PRENATAL VISIT NOTE  Subjective:  Deborah White is a 34 y.o. G2P1001 at [redacted]w[redacted]d being seen today for ongoing prenatal care.  She is currently monitored for the following issues for this low-risk pregnancy and has Breast lump in female; Family history of breast cancer in mother; Obesity affecting pregnancy, antepartum; Supervision of other normal pregnancy, antepartum; Previous cesarean delivery affecting pregnancy, antepartum; Fetal abnormality affecting management of mother, antepartum; and GBS (group B Streptococcus carrier), +RV culture, currently pregnant on their problem list.  Patient reports no complaints.  Contractions: Irregular. Vag. Bleeding: None.  Movement: Present. Denies leaking of fluid.   The following portions of the patient's history were reviewed and updated as appropriate: allergies, current medications, past family history, past medical history, past social history, past surgical history and problem list.   Objective:   Vitals:   02/27/20 1004  BP: 110/73  Pulse: (!) 102  Weight: 224 lb (101.6 kg)    Fetal Status: Fetal Heart Rate (bpm): 150 Fundal Height: 37 cm Movement: Present  Presentation: Vertex  General:  Alert, oriented and cooperative. Patient is in no acute distress.  Skin: Skin is warm and dry. No rash noted.   Cardiovascular: Normal heart rate noted  Respiratory: Normal respiratory effort, no problems with respiration noted  Abdomen: Soft, gravid, appropriate for gestational age.  Pain/Pressure: Present     Pelvic: Cervical exam performed in the presence of a chaperone Dilation: Fingertip Effacement (%): Thick Station: Ballotable  Extremities: Normal range of motion.  Edema: None  Mental Status: Normal mood and affect. Normal behavior. Normal judgment and thought content.   Assessment and Plan:  Pregnancy: G2P1001 at [redacted]w[redacted]d Deborah White was seen today for routine prenatal visit.  Diagnoses and all orders for this visit:  Supervision of other  normal pregnancy, antepartum - RTC on 3/29 for OB visit with NST - Will schedule IOL at 41w - Vertex by BSUS; plan for Foley bulb and Pitocin   Previous cesarean delivery affecting pregnancy, antepartum - Signed TOLAC consent on 2/24  GBS (group B Streptococcus carrier), +RV culture, currently pregnant - Needs intrapartum ppx   Term labor symptoms and general obstetric precautions including but not limited to vaginal bleeding, contractions, leaking of fluid and fetal movement were reviewed in detail with the patient. Please refer to After Visit Summary for other counseling recommendations.   Return in 6 days (on 03/04/2020) for ROB; in-person (needs NST).  Future Appointments  Date Time Provider Department Center  03/04/2020  2:45 PM Adam Phenix, MD [facility] None    Joselyn Arrow, MD

## 2020-02-28 ENCOUNTER — Other Ambulatory Visit: Payer: Self-pay | Admitting: Family Medicine

## 2020-03-04 ENCOUNTER — Other Ambulatory Visit (HOSPITAL_COMMUNITY)
Admission: RE | Admit: 2020-03-04 | Discharge: 2020-03-04 | Disposition: A | Discharge status: Home / Self Care | Payer: BLUE CROSS/BLUE SHIELD | Source: Ambulatory Visit | Attending: Family Medicine | Admitting: Family Medicine

## 2020-03-04 ENCOUNTER — Ambulatory Visit (INDEPENDENT_AMBULATORY_CARE_PROVIDER_SITE_OTHER): Payer: BLUE CROSS/BLUE SHIELD | Admitting: Obstetrics & Gynecology

## 2020-03-04 ENCOUNTER — Other Ambulatory Visit: Payer: Self-pay

## 2020-03-04 VITALS — BP 106/70 | HR 88 | Wt 226.1 lb

## 2020-03-04 DIAGNOSIS — O34219 Maternal care for unspecified type scar from previous cesarean delivery: Secondary | ICD-10-CM

## 2020-03-04 DIAGNOSIS — Z01812 Encounter for preprocedural laboratory examination: Secondary | ICD-10-CM | POA: Diagnosis present

## 2020-03-04 DIAGNOSIS — Z20822 Contact with and (suspected) exposure to covid-19: Secondary | ICD-10-CM | POA: Diagnosis not present

## 2020-03-04 DIAGNOSIS — Z3480 Encounter for supervision of other normal pregnancy, unspecified trimester: Secondary | ICD-10-CM

## 2020-03-04 DIAGNOSIS — O99820 Streptococcus B carrier state complicating pregnancy: Secondary | ICD-10-CM

## 2020-03-04 DIAGNOSIS — O480 Post-term pregnancy: Secondary | ICD-10-CM

## 2020-03-04 DIAGNOSIS — Z3A40 40 weeks gestation of pregnancy: Secondary | ICD-10-CM

## 2020-03-04 LAB — SARS CORONAVIRUS 2 (TAT 6-24 HRS): SARS Coronavirus 2: NEGATIVE

## 2020-03-04 NOTE — Patient Instructions (Signed)

## 2020-03-04 NOTE — Progress Notes (Signed)
Patient reports fetal movement with some contractions. 

## 2020-03-04 NOTE — Progress Notes (Signed)
   PRENATAL VISIT NOTE  Subjective:  Deborah White is a 34 y.o. G2P1001 at [redacted]w[redacted]d being seen today for ongoing prenatal care.  She is currently monitored for the following issues for this high-risk pregnancy and has Breast lump in female; Family history of breast cancer in mother; Obesity affecting pregnancy, antepartum; Supervision of other normal pregnancy, antepartum; Previous cesarean delivery affecting pregnancy, antepartum; Fetal abnormality affecting management of mother, antepartum; and GBS (group B Streptococcus carrier), +RV culture, currently pregnant on their problem list.  Patient reports no complaints.  Contractions: Irregular. Vag. Bleeding: None.  Movement: Present. Denies leaking of fluid.   The following portions of the patient's history were reviewed and updated as appropriate: allergies, current medications, past family history, past medical history, past social history, past surgical history and problem list.   Objective:   Vitals:   03/04/20 1412  BP: 106/70  Pulse: 88  Weight: 226 lb 1.6 oz (102.6 kg)    Fetal Status: Fetal Heart Rate (bpm): NST   Movement: Present     General:  Alert, oriented and cooperative. Patient is in no acute distress.  Skin: Skin is warm and dry. No rash noted.   Cardiovascular: Normal heart rate noted  Respiratory: Normal respiratory effort, no problems with respiration noted  Abdomen: Soft, gravid, appropriate for gestational age.  Pain/Pressure: Present     Pelvic: Cervical exam deferred        Extremities: Normal range of motion.  Edema: None  Mental Status: Normal mood and affect. Normal behavior. Normal judgment and thought content.   Assessment and Plan:  Pregnancy: G2P1001 at [redacted]w[redacted]d 1. Supervision of other normal pregnancy, antepartum Reactive NST - Fetal nonstress test; Future  2. Previous cesarean delivery affecting pregnancy, antepartum Plans TOLAC 3. GBS (group B Streptococcus carrier), +RV culture, currently  pregnant IOL 3/31  Term labor symptoms and general obstetric precautions including but not limited to vaginal bleeding, contractions,leaking of fluid and fetal movement were reviewed in detail with the patient. Please refer to After Visit Summary for other counseling recommendations.   Return in about 4 weeks (around 04/01/2020) for postpartum.  Future Appointments  Date Time Provider Department Center  03/04/2020  2:45 PM Adam Phenix, MD [facility] None  03/06/2020 12:00 AM [facility] SCHED ROOM [facility] None    Scheryl Darter, MD

## 2020-03-06 ENCOUNTER — Inpatient Hospital Stay (HOSPITAL_COMMUNITY): Payer: BLUE CROSS/BLUE SHIELD | Admitting: Anesthesiology

## 2020-03-06 ENCOUNTER — Inpatient Hospital Stay (HOSPITAL_COMMUNITY): Payer: BLUE CROSS/BLUE SHIELD

## 2020-03-06 ENCOUNTER — Inpatient Hospital Stay (HOSPITAL_COMMUNITY)
Admission: AD | Admit: 2020-03-06 | Discharge: 2020-03-09 | DRG: 788 | Disposition: A | Discharge status: Home / Self Care | Payer: BLUE CROSS/BLUE SHIELD | Attending: Obstetrics & Gynecology | Admitting: Obstetrics & Gynecology

## 2020-03-06 ENCOUNTER — Encounter (HOSPITAL_COMMUNITY): Payer: Self-pay | Admitting: Family Medicine

## 2020-03-06 ENCOUNTER — Other Ambulatory Visit: Payer: Self-pay

## 2020-03-06 DIAGNOSIS — O34211 Maternal care for low transverse scar from previous cesarean delivery: Secondary | ICD-10-CM | POA: Diagnosis present

## 2020-03-06 DIAGNOSIS — O99210 Obesity complicating pregnancy, unspecified trimester: Secondary | ICD-10-CM | POA: Diagnosis present

## 2020-03-06 DIAGNOSIS — Z87891 Personal history of nicotine dependence: Secondary | ICD-10-CM | POA: Diagnosis not present

## 2020-03-06 DIAGNOSIS — O480 Post-term pregnancy: Principal | ICD-10-CM | POA: Diagnosis present

## 2020-03-06 DIAGNOSIS — O99824 Streptococcus B carrier state complicating childbirth: Secondary | ICD-10-CM | POA: Diagnosis present

## 2020-03-06 DIAGNOSIS — O359XX0 Maternal care for (suspected) fetal abnormality and damage, unspecified, not applicable or unspecified: Secondary | ICD-10-CM | POA: Diagnosis present

## 2020-03-06 DIAGNOSIS — Z3490 Encounter for supervision of normal pregnancy, unspecified, unspecified trimester: Secondary | ICD-10-CM

## 2020-03-06 DIAGNOSIS — O358XX0 Maternal care for other (suspected) fetal abnormality and damage, not applicable or unspecified: Secondary | ICD-10-CM | POA: Diagnosis present

## 2020-03-06 DIAGNOSIS — E669 Obesity, unspecified: Secondary | ICD-10-CM | POA: Diagnosis present

## 2020-03-06 DIAGNOSIS — O99214 Obesity complicating childbirth: Secondary | ICD-10-CM | POA: Diagnosis present

## 2020-03-06 DIAGNOSIS — Z3A41 41 weeks gestation of pregnancy: Secondary | ICD-10-CM

## 2020-03-06 DIAGNOSIS — Z98891 History of uterine scar from previous surgery: Secondary | ICD-10-CM

## 2020-03-06 DIAGNOSIS — O34219 Maternal care for unspecified type scar from previous cesarean delivery: Secondary | ICD-10-CM | POA: Diagnosis present

## 2020-03-06 DIAGNOSIS — Z3480 Encounter for supervision of other normal pregnancy, unspecified trimester: Secondary | ICD-10-CM

## 2020-03-06 DIAGNOSIS — O99820 Streptococcus B carrier state complicating pregnancy: Secondary | ICD-10-CM

## 2020-03-06 LAB — CBC
HCT: 35.6 % — ABNORMAL LOW (ref 36.0–46.0)
Hemoglobin: 11.9 g/dL — ABNORMAL LOW (ref 12.0–15.0)
MCH: 28.4 pg (ref 26.0–34.0)
MCHC: 33.4 g/dL (ref 30.0–36.0)
MCV: 85 fL (ref 80.0–100.0)
Platelets: 216 10*3/uL (ref 150–400)
RBC: 4.19 MIL/uL (ref 3.87–5.11)
RDW: 13.7 % (ref 11.5–15.5)
WBC: 11.7 10*3/uL — ABNORMAL HIGH (ref 4.0–10.5)
nRBC: 0 % (ref 0.0–0.2)

## 2020-03-06 LAB — TYPE AND SCREEN
ABO/RH(D): O POS
Antibody Screen: NEGATIVE

## 2020-03-06 LAB — ABO/RH: ABO/RH(D): O POS

## 2020-03-06 LAB — RPR: RPR Ser Ql: NONREACTIVE

## 2020-03-06 MED ORDER — FENTANYL-BUPIVACAINE-NACL 0.5-0.125-0.9 MG/250ML-% EP SOLN
12.0000 mL/h | EPIDURAL | Status: DC | PRN
  Administered 2020-03-07: 12 mL/h via EPIDURAL
  Filled 2020-03-06: qty 250

## 2020-03-06 MED ORDER — FENTANYL-BUPIVACAINE-NACL 0.5-0.125-0.9 MG/250ML-% EP SOLN
EPIDURAL | Status: AC
  Filled 2020-03-06: qty 250

## 2020-03-06 MED ORDER — FENTANYL CITRATE (PF) 100 MCG/2ML IJ SOLN
100.0000 ug | INTRAMUSCULAR | Status: DC | PRN
  Administered 2020-03-06 (×4): 100 ug via INTRAVENOUS
  Filled 2020-03-06 (×4): qty 2

## 2020-03-06 MED ORDER — SODIUM CHLORIDE (PF) 0.9 % IJ SOLN
INTRAMUSCULAR | Status: DC | PRN
  Administered 2020-03-06: 12 mL/h via EPIDURAL

## 2020-03-06 MED ORDER — ACETAMINOPHEN 325 MG PO TABS: 650.0000 mg | ORAL_TABLET | ORAL | Status: DC | PRN

## 2020-03-06 MED ORDER — SOD CITRATE-CITRIC ACID 500-334 MG/5ML PO SOLN
30.0000 mL | ORAL | Status: DC | PRN
  Administered 2020-03-07: 20:00:00 30 mL via ORAL
  Filled 2020-03-06: qty 30

## 2020-03-06 MED ORDER — LACTATED RINGERS IV SOLN
500.0000 mL | Freq: Once | INTRAVENOUS | Status: AC
  Administered 2020-03-06: 500 mL via INTRAVENOUS

## 2020-03-06 MED ORDER — LACTATED RINGERS IV SOLN: INTRAVENOUS | Status: DC

## 2020-03-06 MED ORDER — DIPHENHYDRAMINE HCL 50 MG/ML IJ SOLN: 12.5000 mg | INTRAMUSCULAR | Status: DC | PRN

## 2020-03-06 MED ORDER — EPHEDRINE 5 MG/ML INJ
10.0000 mg | INTRAVENOUS | Status: DC | PRN
  Administered 2020-03-07: 10 mg via INTRAVENOUS

## 2020-03-06 MED ORDER — TERBUTALINE SULFATE 1 MG/ML IJ SOLN: 0.2500 mg | Freq: Once | INTRAMUSCULAR | Status: DC | PRN

## 2020-03-06 MED ORDER — LIDOCAINE HCL (PF) 1 % IJ SOLN
INTRAMUSCULAR | Status: DC | PRN
  Administered 2020-03-06: 10 mL via EPIDURAL
  Administered 2020-03-06: 2 mL via EPIDURAL

## 2020-03-06 MED ORDER — SODIUM CHLORIDE 0.9 % IV SOLN
5.0000 10*6.[IU] | Freq: Once | INTRAVENOUS | Status: AC
  Administered 2020-03-06: 02:00:00 5 10*6.[IU] via INTRAVENOUS
  Filled 2020-03-06: qty 5

## 2020-03-06 MED ORDER — OXYTOCIN 40 UNITS IN NORMAL SALINE INFUSION - SIMPLE MED
1.0000 m[IU]/min | INTRAVENOUS | Status: DC
  Administered 2020-03-06: 1 m[IU]/min via INTRAVENOUS
  Administered 2020-03-07: 10 m[IU]/min via INTRAVENOUS
  Filled 2020-03-06 (×2): qty 1000

## 2020-03-06 MED ORDER — PHENYLEPHRINE 40 MCG/ML (10ML) SYRINGE FOR IV PUSH (FOR BLOOD PRESSURE SUPPORT): 80.0000 ug | PREFILLED_SYRINGE | INTRAVENOUS | Status: DC | PRN

## 2020-03-06 MED ORDER — ONDANSETRON HCL 4 MG/2ML IJ SOLN
4.0000 mg | Freq: Four times a day (QID) | INTRAMUSCULAR | Status: DC | PRN
  Administered 2020-03-06 – 2020-03-07 (×2): 4 mg via INTRAVENOUS
  Filled 2020-03-06: qty 2

## 2020-03-06 MED ORDER — LACTATED RINGERS IV SOLN
500.0000 mL | INTRAVENOUS | Status: DC | PRN
  Administered 2020-03-06 – 2020-03-07 (×4): 500 mL via INTRAVENOUS

## 2020-03-06 MED ORDER — EPHEDRINE 5 MG/ML INJ
10.0000 mg | INTRAVENOUS | Status: DC | PRN
  Filled 2020-03-06: qty 10

## 2020-03-06 MED ORDER — OXYTOCIN 40 UNITS IN NORMAL SALINE INFUSION - SIMPLE MED: 2.5000 [IU]/h | INTRAVENOUS | Status: DC

## 2020-03-06 MED ORDER — FLUCONAZOLE 150 MG PO TABS
150.0000 mg | ORAL_TABLET | Freq: Once | ORAL | Status: AC
  Administered 2020-03-06: 150 mg via ORAL
  Filled 2020-03-06: qty 1

## 2020-03-06 MED ORDER — LIDOCAINE HCL (PF) 1 % IJ SOLN: 30.0000 mL | INTRAMUSCULAR | Status: DC | PRN

## 2020-03-06 MED ORDER — OXYTOCIN BOLUS FROM INFUSION: 500.0000 mL | Freq: Once | INTRAVENOUS | Status: DC

## 2020-03-06 MED ORDER — PENICILLIN G POT IN DEXTROSE 60000 UNIT/ML IV SOLN
3.0000 10*6.[IU] | INTRAVENOUS | Status: DC
  Administered 2020-03-06 – 2020-03-07 (×9): 3 10*6.[IU] via INTRAVENOUS
  Filled 2020-03-06 (×9): qty 50

## 2020-03-06 NOTE — Progress Notes (Signed)
Deborah White is a 34 y.o. G2P1001 at [redacted]w[redacted]d by ultrasound admitted for induction of labor due to Post dates..  Subjective: Doing well, comfortable with epidural Declines AROM but considering  Objective: BP (!) 89/39   Pulse 78   Temp 99.3 F (37.4 C) (Oral)   Resp 18   Ht 5\' 6"  (1.676 m)   Wt 93.4 kg   LMP 05/24/2019   SpO2 98%   BMI 33.25 kg/m  I/O last 3 completed shifts: In: -  Out: 700 [Urine:700] Total I/O In: -  Out: 750 [Urine:750]  FHT:  FHR: 140 bpm, variability: moderate,  accelerations:  Present,  decelerations:  Present early UC:   regular, every 6 minutes SVE:   Dilation: 4.5 Effacement (%): 80 Station: -1, -2 Exam by:: Meg Niemeier, CNM  Labs: Lab Results  Component Value Date   WBC 11.7 (H) 03/06/2020   HGB 11.9 (L) 03/06/2020   HCT 35.6 (L) 03/06/2020   MCV 85.0 03/06/2020   PLT 216 03/06/2020    Assessment / Plan: Induction of labor due to postterm,  progressing well on pitocin  Labor: Progressing on Pitocin, will continue to increase then AROM Preeclampsia:  n/a Fetal Wellbeing:  Category I Pain Control:  Epidural I/D:  n/a Anticipated MOD:  NSVD and VBAC  03/08/2020 03/06/2020, 10:00 PM

## 2020-03-06 NOTE — Progress Notes (Signed)
Deborah White is a 34 y.o. G2P1001 at [redacted]w[redacted]d  admitted for induction of labor due to Post dates. Due date 02/28/2020.  Subjective:  Feeling some pressure with contractions. Otherwise coping well.   Objective: BP 101/63   Pulse 73   Temp 97.6 F (36.4 C) (Oral)   Resp 20   Ht 5\' 6"  (1.676 m)   Wt 93.4 kg   LMP 05/24/2019   BMI 33.25 kg/m  No intake/output data recorded. No intake/output data recorded.  FHT:  FHR: 145 bpm, variability: moderate,  accelerations:  Present,  decelerations:  Present early decelerations  UC:   regular, every 5 minutes SVE:   Dilation: 5 Effacement (%): 60 Station: -2 Exam by:: [email or web address], CNM  Labs: Lab Results  Component Value Date   WBC 11.7 (H) 03/06/2020   HGB 11.9 (L) 03/06/2020   HCT 35.6 (L) 03/06/2020   MCV 85.0 03/06/2020   PLT 216 03/06/2020    Assessment / Plan: Induction of labor due to postterm,  progressing well on pitocin  Labor: Progressing normally Preeclampsia:  NA Fetal Wellbeing:  Category I Pain Control:  Labor support without medications I/D:  n/a Anticipated MOD:  NSVD   03/08/2020 DNP, CNM  03/06/20  12:07 PM

## 2020-03-06 NOTE — H&P (Signed)
OBSTETRIC ADMISSION HISTORY AND PHYSICAL  Deborah White is a 34 y.o. female G2P1001 with IUP at 74w0dpresenting for term IOL. She reports +FMs. No LOF, VB, blurry vision, headaches, peripheral edema, or RUQ pain. She plans on breastfeeding. She requests Nexplanon for birth control.  Dating: By LMP --->  Estimated Date of Delivery: 02/28/20  Sono:   '@[redacted]w[redacted]d' , normal anatomy with the following exception, cephalic presentation, [id number] 4#11%ile, EFW 48% - fetal pyelectasis (left)  Prenatal History/Complications: GBS positive TOLAC, H/o pLTCS 2/2 NRFHT 06/2018  Past Medical History: Past Medical History:  Diagnosis Date  . Anemia   . Thyroid disease     Past Surgical History: Past Surgical History:  Procedure Laterality Date  . BACK SURGERY  2010   cyst removal  . CESAREAN SECTION N/A 06/11/2018   Procedure: CESAREAN SECTION;  Surgeon: PDonnamae Jude MD;  [facility]  Service: Obstetrics;  Laterality: N/A;  . WRIST SURGERY Left 2011   Cyst    Obstetrical History: OB History    Gravida  2   Para  1   Term  1   Preterm      AB      Living  1     SAB      TAB      Ectopic      Multiple  0   Live Births  1           Social History: Social History   Socioeconomic History  . Marital status: Married    Spouse name: Not on file  . Number of children: Not on file  . Years of education: Not on file  . Highest education level: Not on file  Occupational History  . Occupation: unemployed  Tobacco Use  . Smoking status: Former Smoker    Types: Cigarettes    Quit date: 06/23/2016    Years since quitting: 3.7  . Smokeless tobacco: Never Used  Substance and Sexual Activity  . Alcohol use: No    Alcohol/week: 0.0 standard drinks  . Drug use: No  . Sexual activity: Yes    Partners: Male    Birth control/protection: None  Other Topics Concern  . Not on file  Social History Narrative  . Not on file   Social Determinants of  Health   Financial Resource Strain:   . Difficulty of Paying Living Expenses:   Food Insecurity:   . Worried About RCharity fundraiserin the Last Year:   . RArboriculturistin the Last Year:   Transportation Needs:   . LFilm/video editor(Medical):   .Marland KitchenLack of Transportation (Non-Medical):   Physical Activity:   . Days of Exercise per Week:   . Minutes of Exercise per Session:   Stress:   . Feeling of Stress :   Social Connections:   . Frequency of Communication with Friends and Family:   . Frequency of Social Gatherings with Friends and Family:   . Attends Religious Services:   . Active Member of Clubs or Organizations:   . Attends CArchivistMeetings:   .Marland KitchenMarital Status:     Family History: Family History  Problem Relation Age of Onset  . Breast cancer Mother   . Hypertension Mother   . Cancer Maternal Uncle        brain cancer   . Hypertension Maternal Grandmother     Allergies: No Known Allergies  Medications Prior to Admission  Medication  Sig Dispense Refill Last Dose  . Prenatal 27-1 MG TABS Take 1 tablet by mouth daily. 90 tablet 4 03/05/2020 at Unknown time  . acetaminophen (TYLENOL) 500 MG tablet Take 500 mg by mouth every 6 (six) hours as needed for moderate pain.     . B-COMPLEX-C PO Take by mouth.     . Blood Pressure Monitor KIT 1 Device by Does not apply route once a week. To be monitored Regularly at home. 1 kit 0   . docusate sodium (COLACE) 50 MG capsule Take 1 capsule (50 mg total) by mouth 2 (two) times daily as needed for mild constipation. (Patient not taking: Reported on 07/19/2019) 10 capsule 0   . Ensure (ENSURE) Take 237 mLs by mouth 2 (two) times daily between meals. (Patient not taking: Reported on 12/06/2019) 237 mL 12   . ferrous sulfate 325 (65 FE) MG tablet Take 325 mg by mouth daily with breakfast.     . ibuprofen (ADVIL,MOTRIN) 600 MG tablet Take 1 tablet (600 mg total) by mouth every 6 (six) hours. (Patient not taking:  Reported on 07/21/2018) 30 tablet 0   . metroNIDAZOLE (FLAGYL) 500 MG tablet Take 1 tablet (500 mg total) by mouth 2 (two) times daily. (Patient not taking: Reported on 02/07/2020) 14 tablet 0   . sertraline (ZOLOFT) 50 MG tablet Take 1 tablet (50 mg total) by mouth daily. (Patient not taking: Reported on 01/09/2020) 30 tablet 3   . terconazole (TERAZOL 3) 0.8 % vaginal cream Place 1 applicator vaginally at bedtime. Apply nightly for three nights. 20 g 0      Review of Systems:  All systems reviewed and negative except as stated in HPI  PE: Blood pressure 129/67, pulse 94, temperature 98.2 F (36.8 C), temperature source Oral, resp. rate 16, height '5\' 6"'  (1.676 m), weight 93.4 kg, last menstrual period 05/24/2019, currently breastfeeding. General appearance: alert and cooperative Lungs: regular rate and effort Heart: regular rate  Abdomen: soft, non-tender Extremities: Homans sign is negative, no sign of DVT Presentation: cephalic EFM: 102 bpm, moderate variability, 15x15 accels, no decels Toco: uterine irritability Dilation: Fingertip Effacement (%): Thick Station: -3 Exam by:: Dr. Darene Lamer  Prenatal labs: ABO, Rh: --/--/PENDING (03/31 0045) Antibody: PENDING (03/31 0045) Rubella: 10.40 (10/12 1353) RPR: Non Reactive (12/21 0927)  HBsAg: Negative (10/12 1353)  HIV: Non Reactive (12/21 0927)  GBS: Positive/-- (02/24 0133)  2 hr GTT normal  Prenatal Transfer Tool  Maternal Diabetes: No Genetic Screening: Normal Maternal Ultrasounds/Referrals: Other: renal pyelectasis Fetal Ultrasounds or other Referrals:  None Maternal Substance Abuse:  No Significant Maternal Medications:  None Significant Maternal Lab Results: Group B Strep positive  Results for orders placed or performed during the hospital encounter of 03/06/20 (from the past 24 hour(s))  CBC   Collection Time: 03/06/20 12:45 AM  Result Value Ref Range   WBC 11.7 (H) 4.0 - 10.5 K/uL   RBC 4.19 3.87 - 5.11 MIL/uL    Hemoglobin 11.9 (L) 12.0 - 15.0 g/dL   HCT 35.6 (L) 36.0 - 46.0 %   MCV 85.0 80.0 - 100.0 fL   MCH 28.4 26.0 - 34.0 pg   MCHC 33.4 30.0 - 36.0 g/dL   RDW 13.7 11.5 - 15.5 %   Platelets 216 150 - 400 K/uL   nRBC 0.0 0.0 - 0.2 %  Type and screen   Collection Time: 03/06/20 12:45 AM  Result Value Ref Range   ABO/RH(D) PENDING    Antibody Screen PENDING  Sample Expiration      03/09/2020,2359 Performed at [facility], [place] [address], [place], [place] [address]     Patient Active Problem List   Diagnosis Date Noted  . Encounter for induction of labor 03/06/2020  . Labor and delivery, indication for care 03/06/2020  . GBS (group B Streptococcus carrier), +RV culture, currently pregnant 02/05/2020  . Fetal abnormality affecting management of mother, antepartum 01/10/2020  . Supervision of other normal pregnancy, antepartum 09/18/2019  . Previous cesarean delivery affecting pregnancy, antepartum 09/18/2019  . Obesity affecting pregnancy, antepartum 12/02/2017  . Family history of breast cancer in mother 01/31/2016  . Breast lump in female 11/29/2013    Assessment: Deborah White is a 34 y.o. G2P1001 at 28w0dhere for IOL for post dates  1. Labor: TOLAC. Cook's catheter placed. Will start low dose pitocin. 2. FWB: Cat I, EFW 3. Pain: per patient request- discussed early epidural 2/2 TOLAC 4. GBS: positive, PCN   Plan: Admit to Labor and Delivery.  Risks and benefits of induction were reviewed, including failure of method, prolonged labor, need for further intervention, risk of cesarean.  Patient and family seem to understand these risks and wish to proceed. Options of foley bulb, AROM, and pitocin reviewed, with use of each discussed.  Reviewed risks/benefits of TOLAC versus RCS in detail. Patient counseled regarding potential vaginal delivery, chance of success, future implications, possible uterine rupture and need for urgent/emergent repeat cesarean.  Counseled regarding potential need for repeat c-section for reasons unrelated to first c-section. Counseled regarding scheduled repeat cesarean including risks of bleeding, infection, damage to surrounding tissue, abnormal placentation, implications for future pregnancies. All questions answered.  Patient desires TOLAC, consent signed 01/31/20.  Anticipate VBAC, CS as indicated  Torrin Frein L Salif Tay, DO  03/06/2020, 1:22 AM

## 2020-03-06 NOTE — Progress Notes (Signed)
Aracelis Lamaria Hildebrandt is a 34 y.o. G2P1001 at [redacted]w[redacted]d Subjective:  Comfortable with epidural.  Objective: BP (!) 102/53   Pulse 75   Temp 97.8 F (36.6 C) (Oral)   Resp 20   Ht 5\' 6"  (1.676 m)   Wt 93.4 kg   LMP 05/24/2019   SpO2 99%   BMI 33.25 kg/m  No intake/output data recorded. Total I/O In: -  Out: 700 [Urine:700]  FHT:  FHR: 150 bpm, variability: moderate,  accelerations:  Present,  decelerations:  Present early UC:   regular, every 3-7 minutes SVE:   Dilation: 5 Effacement (%): 80 Station: -1 Exam by:: [email or web address], CNM  Labs: Lab Results  Component Value Date   WBC 11.7 (H) 03/06/2020   HGB 11.9 (L) 03/06/2020   HCT 35.6 (L) 03/06/2020   MCV 85.0 03/06/2020   PLT 216 03/06/2020    Assessment / Plan: IOL 2/2 post dates. Making change with pitocin   Labor: Progressing normally Preeclampsia:  NA Fetal Wellbeing:  Category I Pain Control:  Epidural I/D:  n/a Anticipated MOD:  NSVD  03/08/2020 DNP, CNM  03/06/20  5:13 PM   After leaving the room patient noted to have 2 deep variables down to nadir of 85 x 1.5 mins each. RN started bolus and repositioned patient. Will cut pitocin in 1/2 at this time from 56mu/min to 67mu/min. Will continue to monitor. Will update attending at sign out.

## 2020-03-06 NOTE — Anesthesia Preprocedure Evaluation (Signed)
Anesthesia Evaluation  Patient identified by MRN, date of birth, ID band Patient awake    Reviewed: Allergy & Precautions, Patient's Chart, lab work & pertinent test results  Airway Mallampati: II  TM Distance: >3 FB Neck ROM: Full    Dental no notable dental hx.    Pulmonary former smoker,  Quit smoking 2017   Pulmonary exam normal breath sounds clear to auscultation       Cardiovascular negative cardio ROS Normal cardiovascular exam Rhythm:Regular Rate:Normal     Neuro/Psych negative neurological ROS  negative psych ROS   GI/Hepatic negative GI ROS, Neg liver ROS,   Endo/Other  Hypothyroidism Obesity BMI 33  Renal/GU negative Renal ROS  negative genitourinary   Musculoskeletal negative musculoskeletal ROS (+)   Abdominal   Peds negative pediatric ROS (+)  Hematology  (+) Blood dyscrasia, anemia , hct 35.6, plt 216   Anesthesia Other Findings   Reproductive/Obstetrics (+) Pregnancy TOLAC                             Anesthesia Physical Anesthesia Plan  ASA: II and emergent  Anesthesia Plan: Epidural   Post-op Pain Management:    Induction:   PONV Risk Score and Plan: Treatment may vary due to age or medical condition  Airway Management Planned: Natural Airway  Additional Equipment: None  Intra-op Plan:   Post-operative Plan:   Informed Consent: I have reviewed the patients History and Physical, chart, labs and discussed the procedure including the risks, benefits and alternatives for the proposed anesthesia with the patient or authorized representative who has indicated his/her understanding and acceptance.       Plan Discussed with: CRNA  Anesthesia Plan Comments:         Anesthesia Quick Evaluation

## 2020-03-06 NOTE — Progress Notes (Signed)
Labor Progress Note Deborah White is a 34 y.o. G2P1001 at [redacted]w[redacted]d presented for IOL for post-dates S:  No complaints. Feeling some contractions but not too painful at this point. O:  BP (!) 118/58   Pulse 86   Temp 97.8 F (36.6 C) (Oral)   Resp 18   Ht 5\' 6"  (1.676 m)   Wt 93.4 kg   LMP 05/24/2019   BMI 33.25 kg/m  EFM: 145/moderate/acels present  CVE: Dilation: 4 Effacement (%): 30, 40 Cervical Position: Middle Station: -3, -2 Presentation: Vertex Exam by:: [email or web address], RN    A&P: 34 y.o. G2P1001 [redacted]w[redacted]d here for IOL for post-dates. #Labor: FB out at this point, continuing on pitocin. #Pain: Analgesia PRN #FWB: Cat I #GBS positive - PCN  [redacted]w[redacted]d, DO 9:25 AM

## 2020-03-06 NOTE — Anesthesia Procedure Notes (Signed)
Epidural Patient location during procedure: OB Start time: 03/06/2020 2:53 PM End time: 03/06/2020 3:00 PM  Staffing Anesthesiologist: Lannie Fields, DO Performed: anesthesiologist   Preanesthetic Checklist Completed: patient identified, IV checked, risks and benefits discussed, monitors and equipment checked, pre-op evaluation and timeout performed  Epidural Patient position: sitting Prep: DuraPrep and site prepped and draped Patient monitoring: continuous pulse ox, blood pressure, heart rate and cardiac monitor Approach: midline Location: L3-L4 Injection technique: LOR air  Needle:  Needle type: Tuohy  Needle gauge: 17 G Needle length: 9 cm Needle insertion depth: 6 cm Catheter type: closed end flexible Catheter size: 19 Gauge Catheter at skin depth: 11 cm Test dose: negative  Assessment Sensory level: T8 Events: blood not aspirated, injection not painful, no injection resistance, no paresthesia and negative IV test  Additional Notes Patient identified. Risks/Benefits/Options discussed with patient including but not limited to bleeding, infection, nerve damage, paralysis, failed block, incomplete pain control, headache, blood pressure changes, nausea, vomiting, reactions to medication both or allergic, itching and postpartum back pain. Confirmed with bedside nurse the patient's most recent platelet count. Confirmed with patient that they are not currently taking any anticoagulation, have any bleeding history or any family history of bleeding disorders. Patient expressed understanding and wished to proceed. All questions were answered. Sterile technique was used throughout the entire procedure. Please see nursing notes for vital signs. Test dose was given through epidural catheter and negative prior to continuing to dose epidural or start infusion. Warning signs of high block given to the patient including shortness of breath, tingling/numbness in hands, complete motor  block, or any concerning symptoms with instructions to call for help. Patient was given instructions on fall risk and not to get out of bed. All questions and concerns addressed with instructions to call with any issues or inadequate analgesia.  Reason for block:procedure for pain

## 2020-03-07 ENCOUNTER — Encounter (HOSPITAL_COMMUNITY): Payer: Self-pay | Admitting: Anesthesiology

## 2020-03-07 ENCOUNTER — Encounter (HOSPITAL_COMMUNITY)
Admission: AD | Disposition: A | Discharge status: Home / Self Care | Payer: Self-pay | Source: Home / Self Care | Attending: Obstetrics & Gynecology

## 2020-03-07 ENCOUNTER — Encounter (HOSPITAL_COMMUNITY): Payer: Self-pay | Admitting: Family Medicine

## 2020-03-07 DIAGNOSIS — Z3A41 41 weeks gestation of pregnancy: Secondary | ICD-10-CM

## 2020-03-07 DIAGNOSIS — O480 Post-term pregnancy: Principal | ICD-10-CM

## 2020-03-07 DIAGNOSIS — O99824 Streptococcus B carrier state complicating childbirth: Secondary | ICD-10-CM

## 2020-03-07 SURGERY — Surgical Case
Anesthesia: Epidural | Wound class: Clean Contaminated

## 2020-03-07 SURGERY — Surgical Case
Anesthesia: Epidural

## 2020-03-07 MED ORDER — TETANUS-DIPHTH-ACELL PERTUSSIS 5-2.5-18.5 LF-MCG/0.5 IM SUSP: 0.5000 mL | Freq: Once | INTRAMUSCULAR | Status: DC

## 2020-03-07 MED ORDER — DEXMEDETOMIDINE HCL IN NACL 200 MCG/50ML IV SOLN
INTRAVENOUS | Status: AC
  Filled 2020-03-07: qty 50

## 2020-03-07 MED ORDER — DIPHENHYDRAMINE HCL 25 MG PO CAPS: 25.0000 mg | ORAL_CAPSULE | ORAL | Status: DC | PRN

## 2020-03-07 MED ORDER — MEPERIDINE HCL 25 MG/ML IJ SOLN
INTRAMUSCULAR | Status: AC
  Filled 2020-03-07: qty 1

## 2020-03-07 MED ORDER — OXYTOCIN 40 UNITS IN NORMAL SALINE INFUSION - SIMPLE MED
INTRAVENOUS | Status: AC
  Filled 2020-03-07: qty 1000

## 2020-03-07 MED ORDER — FENTANYL CITRATE (PF) 100 MCG/2ML IJ SOLN: 25.0000 ug | INTRAMUSCULAR | Status: DC | PRN

## 2020-03-07 MED ORDER — METHYLERGONOVINE MALEATE 0.2 MG/ML IJ SOLN
INTRAMUSCULAR | Status: DC | PRN
  Administered 2020-03-07: .2 mg via INTRAMUSCULAR

## 2020-03-07 MED ORDER — ONDANSETRON HCL 4 MG/2ML IJ SOLN: 4.0000 mg | Freq: Three times a day (TID) | INTRAMUSCULAR | Status: DC | PRN

## 2020-03-07 MED ORDER — KETOROLAC TROMETHAMINE 30 MG/ML IJ SOLN
30.0000 mg | Freq: Once | INTRAMUSCULAR | Status: AC | PRN
  Administered 2020-03-07: 30 mg via INTRAVENOUS

## 2020-03-07 MED ORDER — DIPHENHYDRAMINE HCL 50 MG/ML IJ SOLN: 12.5000 mg | INTRAMUSCULAR | Status: DC | PRN

## 2020-03-07 MED ORDER — ACETAMINOPHEN 325 MG PO TABS
650.0000 mg | ORAL_TABLET | Freq: Four times a day (QID) | ORAL | Status: DC | PRN
  Filled 2020-03-07: qty 2

## 2020-03-07 MED ORDER — KETOROLAC TROMETHAMINE 30 MG/ML IJ SOLN
INTRAMUSCULAR | Status: AC
  Filled 2020-03-07: qty 1

## 2020-03-07 MED ORDER — DIPHENHYDRAMINE HCL 25 MG PO CAPS: 25.0000 mg | ORAL_CAPSULE | Freq: Four times a day (QID) | ORAL | Status: DC | PRN

## 2020-03-07 MED ORDER — NALBUPHINE HCL 10 MG/ML IJ SOLN: 5.0000 mg | INTRAMUSCULAR | Status: DC | PRN

## 2020-03-07 MED ORDER — STERILE WATER FOR IRRIGATION IR SOLN
Status: DC | PRN
  Administered 2020-03-07: 1

## 2020-03-07 MED ORDER — SIMETHICONE 80 MG PO CHEW
80.0000 mg | CHEWABLE_TABLET | Freq: Three times a day (TID) | ORAL | Status: DC
  Administered 2020-03-08 – 2020-03-09 (×4): 80 mg via ORAL
  Filled 2020-03-07 (×3): qty 1

## 2020-03-07 MED ORDER — DEXTROSE IN LACTATED RINGERS 5 % IV SOLN: INTRAVENOUS | Status: DC

## 2020-03-07 MED ORDER — MEPERIDINE HCL 25 MG/ML IJ SOLN
INTRAMUSCULAR | Status: DC | PRN
  Administered 2020-03-07: 12.5 mg via INTRAVENOUS

## 2020-03-07 MED ORDER — SODIUM CHLORIDE 0.9 % IR SOLN
Status: DC | PRN
  Administered 2020-03-07: 1

## 2020-03-07 MED ORDER — SODIUM CHLORIDE 0.9% FLUSH: 3.0000 mL | INTRAVENOUS | Status: DC | PRN

## 2020-03-07 MED ORDER — DEXAMETHASONE SODIUM PHOSPHATE 10 MG/ML IJ SOLN
INTRAMUSCULAR | Status: DC | PRN
  Administered 2020-03-07: 10 mg via INTRAVENOUS

## 2020-03-07 MED ORDER — ONDANSETRON HCL 4 MG/2ML IJ SOLN
INTRAMUSCULAR | Status: AC
  Filled 2020-03-07: qty 2

## 2020-03-07 MED ORDER — CEFAZOLIN SODIUM-DEXTROSE 2-4 GM/100ML-% IV SOLN
2.0000 g | INTRAVENOUS | Status: AC
  Administered 2020-03-07: 2 g via INTRAVENOUS

## 2020-03-07 MED ORDER — ACETAMINOPHEN 500 MG PO TABS
1000.0000 mg | ORAL_TABLET | Freq: Four times a day (QID) | ORAL | Status: AC
  Administered 2020-03-08 (×4): 1000 mg via ORAL
  Filled 2020-03-07 (×4): qty 2

## 2020-03-07 MED ORDER — LIDOCAINE-EPINEPHRINE (PF) 2 %-1:200000 IJ SOLN
INTRAMUSCULAR | Status: DC | PRN
  Administered 2020-03-07: 10 mL via EPIDURAL

## 2020-03-07 MED ORDER — DIBUCAINE (PERIANAL) 1 % EX OINT: 1.0000 "application " | TOPICAL_OINTMENT | CUTANEOUS | Status: DC | PRN

## 2020-03-07 MED ORDER — WITCH HAZEL-GLYCERIN EX PADS: 1.0000 "application " | MEDICATED_PAD | CUTANEOUS | Status: DC | PRN

## 2020-03-07 MED ORDER — MENTHOL 3 MG MT LOZG: 1.0000 | LOZENGE | OROMUCOSAL | Status: DC | PRN

## 2020-03-07 MED ORDER — LACTATED RINGERS IV SOLN: INTRAVENOUS | Status: DC

## 2020-03-07 MED ORDER — NALBUPHINE HCL 10 MG/ML IJ SOLN: 5.0000 mg | Freq: Once | INTRAMUSCULAR | Status: DC | PRN

## 2020-03-07 MED ORDER — DEXMEDETOMIDINE HCL 200 MCG/2ML IV SOLN
INTRAVENOUS | Status: DC | PRN
  Administered 2020-03-07 (×4): 4 ug via INTRAVENOUS

## 2020-03-07 MED ORDER — LACTATED RINGERS AMNIOINFUSION: INTRAVENOUS | Status: DC

## 2020-03-07 MED ORDER — DEXAMETHASONE SODIUM PHOSPHATE 10 MG/ML IJ SOLN
INTRAMUSCULAR | Status: AC
  Filled 2020-03-07: qty 1

## 2020-03-07 MED ORDER — SIMETHICONE 80 MG PO CHEW
80.0000 mg | CHEWABLE_TABLET | ORAL | Status: DC
  Administered 2020-03-08 (×2): 80 mg via ORAL
  Filled 2020-03-07 (×2): qty 1

## 2020-03-07 MED ORDER — KETOROLAC TROMETHAMINE 30 MG/ML IJ SOLN: 30.0000 mg | Freq: Four times a day (QID) | INTRAMUSCULAR | Status: AC | PRN

## 2020-03-07 MED ORDER — PRENATAL MULTIVITAMIN CH
1.0000 | ORAL_TABLET | Freq: Every day | ORAL | Status: DC
  Administered 2020-03-08 – 2020-03-09 (×2): 1 via ORAL
  Filled 2020-03-07 (×2): qty 1

## 2020-03-07 MED ORDER — ZOLPIDEM TARTRATE 5 MG PO TABS: 5.0000 mg | ORAL_TABLET | Freq: Every evening | ORAL | Status: DC | PRN

## 2020-03-07 MED ORDER — OXYTOCIN 40 UNITS IN NORMAL SALINE INFUSION - SIMPLE MED: 2.5000 [IU]/h | INTRAVENOUS | Status: AC

## 2020-03-07 MED ORDER — NALOXONE HCL 4 MG/10ML IJ SOLN
1.0000 ug/kg/h | INTRAVENOUS | Status: DC | PRN
  Filled 2020-03-07: qty 5

## 2020-03-07 MED ORDER — MORPHINE SULFATE (PF) 0.5 MG/ML IJ SOLN
INTRAMUSCULAR | Status: AC
  Filled 2020-03-07: qty 10

## 2020-03-07 MED ORDER — COCONUT OIL OIL: 1.0000 "application " | TOPICAL_OIL | Status: DC | PRN

## 2020-03-07 MED ORDER — METOCLOPRAMIDE HCL 5 MG/ML IJ SOLN
INTRAMUSCULAR | Status: AC
  Filled 2020-03-07: qty 2

## 2020-03-07 MED ORDER — ENOXAPARIN SODIUM 40 MG/0.4ML ~~LOC~~ SOLN
40.0000 mg | SUBCUTANEOUS | Status: DC
  Administered 2020-03-08: 40 mg via SUBCUTANEOUS
  Filled 2020-03-07 (×2): qty 0.4

## 2020-03-07 MED ORDER — SCOPOLAMINE 1 MG/3DAYS TD PT72: 1.0000 | MEDICATED_PATCH | Freq: Once | TRANSDERMAL | Status: DC

## 2020-03-07 MED ORDER — ACETAMINOPHEN 10 MG/ML IV SOLN: 1000.0000 mg | Freq: Once | INTRAVENOUS | Status: DC | PRN

## 2020-03-07 MED ORDER — OXYCODONE HCL 5 MG PO TABS: 5.0000 mg | ORAL_TABLET | ORAL | Status: DC | PRN

## 2020-03-07 MED ORDER — KETOROLAC TROMETHAMINE 30 MG/ML IJ SOLN
30.0000 mg | Freq: Four times a day (QID) | INTRAMUSCULAR | Status: AC
  Administered 2020-03-08 (×3): 30 mg via INTRAVENOUS
  Filled 2020-03-07 (×3): qty 1

## 2020-03-07 MED ORDER — NALOXONE HCL 0.4 MG/ML IJ SOLN: 0.4000 mg | INTRAMUSCULAR | Status: DC | PRN

## 2020-03-07 MED ORDER — SENNOSIDES-DOCUSATE SODIUM 8.6-50 MG PO TABS
2.0000 | ORAL_TABLET | ORAL | Status: DC
  Administered 2020-03-08 (×2): 2 via ORAL
  Filled 2020-03-07 (×2): qty 2

## 2020-03-07 MED ORDER — SODIUM CHLORIDE 0.9 % IV SOLN
INTRAVENOUS | Status: AC
  Filled 2020-03-07: qty 500

## 2020-03-07 MED ORDER — MORPHINE SULFATE (PF) 0.5 MG/ML IJ SOLN
INTRAMUSCULAR | Status: DC | PRN
  Administered 2020-03-07: 3 mg via EPIDURAL

## 2020-03-07 MED ORDER — SIMETHICONE 80 MG PO CHEW: 80.0000 mg | CHEWABLE_TABLET | ORAL | Status: DC | PRN

## 2020-03-07 MED ORDER — SODIUM CHLORIDE 0.9 % IV SOLN: INTRAVENOUS | Status: DC | PRN

## 2020-03-07 MED ORDER — CEFAZOLIN SODIUM-DEXTROSE 2-4 GM/100ML-% IV SOLN
INTRAVENOUS | Status: AC
  Filled 2020-03-07: qty 100

## 2020-03-07 MED ORDER — SODIUM CHLORIDE 0.9 % IV SOLN
500.0000 mg | INTRAVENOUS | Status: AC
  Administered 2020-03-07: 20:00:00 500 mg via INTRAVENOUS

## 2020-03-07 MED ORDER — IBUPROFEN 800 MG PO TABS
800.0000 mg | ORAL_TABLET | Freq: Four times a day (QID) | ORAL | Status: DC
  Administered 2020-03-08 – 2020-03-09 (×3): 800 mg via ORAL
  Filled 2020-03-07 (×3): qty 1

## 2020-03-07 SURGICAL SUPPLY — 6 items
BENZOIN TINCTURE PRP APPL 2/3 (GAUZE/BANDAGES/DRESSINGS) ×1 IMPLANT
DRSG OPSITE POSTOP 4X10 (GAUZE/BANDAGES/DRESSINGS) ×1 IMPLANT
STRIP CLOSURE SKIN 1/2X4 (GAUZE/BANDAGES/DRESSINGS) ×1 IMPLANT
SUT VIC AB 0 CT1 36 (SUTURE) ×1 IMPLANT
SUT VIC AB 2-0 CT1 27 (SUTURE) ×1
SUT VIC AB 2-0 CT1 TAPERPNT 27 (SUTURE) IMPLANT

## 2020-03-07 NOTE — Progress Notes (Signed)
Patient ID: Deborah White, female   DOB: 07-27-1986, 34 y.o.   MRN: [id number] UCs almost stopped AVSS  AROM clear fluid Dilation: 5.5 Effacement (%): 80 Cervical Position: Anterior Station: -2 Presentation: Vertex Exam by:: Tanis Burnley, CNM  FHR reactive UCs irregular  Will restart Pitocin

## 2020-03-07 NOTE — Discharge Summary (Signed)
Postpartum Discharge Summary     Patient Name: Deborah White DOB: [birth year] MRN: [id number]  Date of admission: 03/06/2020 Delivering Provider: Juanna Cao T   Date of discharge: 03/09/2020  Admitting diagnosis: Labor and delivery, indication for care [O75.9] Intrauterine pregnancy: [redacted]w[redacted]d    Secondary diagnosis:  Principal Problem:   Encounter for induction of labor Active Problems:   Obesity affecting pregnancy, antepartum   Status post repeat low transverse cesarean section   Fetal abnormality affecting management of mother, antepartum   GBS (group B Streptococcus carrier), +RV culture, currently pregnant   Labor and delivery, indication for care  Additional problems: None     Discharge diagnosis: Term Pregnancy Delivered                                                                                                Post partum procedures:None  Augmentation: AROM, Pitocin and Foley Balloon  Complications: None  Hospital course:  Induction of Labor With Cesarean Section  34y.o. yo G2P1001 at 432w1das admitted to the hospital 03/06/2020 for induction of labor. Patient had a labor course significant for IOL due to post-dates. She received Foley balloon, Pitocin and AROM. The patient went for cesarean section due to Arrest of Dilation, Arrest of Descent and declined further TOLAC after 44 hours, and delivered a Viable infant,03/07/2020  Membrane Rupture Time/Date: 1:29 AM ,03/07/2020    Details of operation can be found in separate operative Note.  Patient had an uncomplicated postpartum course. She is ambulating, tolerating a regular diet, passing flatus, and urinating well.  Patient is discharged home in stable condition on 03/09/20.  She plans Nexplanon in the office at her PP visit.                                 Delivery time: 8:43 PM    Magnesium Sulfate received: No BMZ received: No Rhophylac:No MMR:No Transfusion:No  Physical exam  Vitals:    03/08/20 1354 03/08/20 1757 03/08/20 2040 03/09/20 0500  BP: 115/68 108/63 117/61 102/62  Pulse: 72 77 75 67  Resp: '20 18 18 18  ' Temp: 97.8 F (36.6 C) 97.7 F (36.5 C) 97.6 F (36.4 C) 97.7 F (36.5 C)  TempSrc: Oral Oral Oral Oral  SpO2: 100% 100%    Weight:      Height:       General: alert, cooperative and no distress Lochia: appropriate Uterine Fundus: firm Incision: 1/3 of honey comb dressing is saturated, minimal drainage past marked area from 03/08/20. DVT Evaluation: No evidence of DVT seen on physical exam. Negative Homan's sign. No cords or calf tenderness. No significant calf/ankle edema. Labs: Lab Results  Component Value Date   WBC 24.0 (H) 03/08/2020   HGB 11.8 (L) 03/08/2020   HCT 36.4 03/08/2020   MCV 87.5 03/08/2020   PLT 215 03/08/2020   CMP Latest Ref Rng & Units 03/08/2020  Glucose 65 - 99 mg/dL -  BUN 6 - 20 mg/dL -  Creatinine 0.44 - 1.00 mg/dL 0.59  Sodium 134 - 144 mmol/L -  Potassium 3.5 - 5.2 mmol/L -  Chloride 96 - 106 mmol/L -  CO2 20 - 29 mmol/L -  Calcium 8.7 - 10.2 mg/dL -  Total Protein 6.0 - 8.5 g/dL -  Total Bilirubin 0.0 - 1.2 mg/dL -  Alkaline Phos 39 - 117 IU/L -  AST 0 - 40 IU/L -  ALT 0 - 32 IU/L -   Edinburgh Score: Edinburgh Postnatal Depression Scale Screening Tool 03/07/2020  I have been able to laugh and see the funny side of things. 3  I have looked forward with enjoyment to things. 0  I have blamed myself unnecessarily when things went wrong. 3  I have been anxious or worried for no good reason. 2  I have felt scared or panicky for no good reason. 1  Things have been getting on top of me. 2  I have been so unhappy that I have had difficulty sleeping. 0  I have felt sad or miserable. 1  I have been so unhappy that I have been crying. 2  The thought of harming myself has occurred to me. 0  Edinburgh Postnatal Depression Scale Total 14    Discharge instruction: per After Visit Summary and "Baby and Me  Booklet".  After visit meds:  Allergies as of 03/09/2020   No Known Allergies     Medication List    TAKE these medications   acetaminophen 500 MG tablet Commonly known as: TYLENOL Take 500 mg by mouth every 6 (six) hours as needed for moderate pain.   Blood Pressure Monitor Kit 1 Device by Does not apply route once a week. To be monitored Regularly at home.   ibuprofen 800 MG tablet Commonly known as: ADVIL Take 1 tablet (800 mg total) by mouth every 6 (six) hours.   oxyCODONE-acetaminophen 5-325 MG tablet Commonly known as: Percocet Take 1 tablet by mouth every 4 (four) hours as needed for severe pain.   Prenatal 27-1 MG Tabs Take 1 tablet by mouth daily.   terconazole 0.8 % vaginal cream Commonly known as: TERAZOL 3 Place 1 applicator vaginally at bedtime. Apply nightly for three nights.       Diet: routine diet  Activity: Advance as tolerated. Pelvic rest for 6 weeks.   Outpatient follow up:4 weeks Follow up Appt:No future appointments. Follow up Visit: [facility] Follow up in 4 week(s).   Why: For Nexplanon insertion and postpartum visit Contact information: [address] [address] [phone]            Please schedule this patient for Postpartum visit in: 4 weeks with the following provider: Any provider Pt desires Nexplanon in office, so message resent to [facility] to change virtual to in person PP visit  For C/S patients schedule nurse incision check in weeks 2 weeks: yes Low risk pregnancy complicated by: none Delivery mode:  CS Anticipated Birth Control:  Nexplanon PP Procedures needed: Incision check  Schedule Integrated BH visit: no     Newborn Data: Live born female  Birth Weight: 3200 g, 7lb 0.9 oz  APGAR: 8, 9  Newborn Delivery   Birth date/time: 03/07/2020 20:43:00 Delivery type: C-Section, Low Transverse Trial of labor: Yes C-section categorization:  Repeat      Baby Feeding: Breast Disposition:home with mother   03/09/2020 Fatima Blank, CNM

## 2020-03-07 NOTE — Progress Notes (Signed)
Labor Progress Note Deborah White is a 34 y.o. G2P1001 at [redacted]w[redacted]d presented for IOL for post date.  S: Patient reports feeling tired and feeling pressure. Agreeing to pursue vaginal delivery.   O:  BP (!) 97/54   Pulse 86   Temp 98.3 F (36.8 C) (Oral)   Resp 18   Ht 5\' 6"  (1.676 m)   Wt 93.4 kg   LMP 05/24/2019   SpO2 98%   BMI 33.25 kg/m  EFM: 160s, accels present, moderate variability, no decels  TOCO: every 2-3 minutes    CVE: Dilation: 7 Effacement (%): 90 Cervical Position: Anterior Station: 0 Presentation: Vertex Exam by:: [email or web address], RN    A&P: 34 y.o. G2P1001 [redacted]w[redacted]d here for IOL for post dates.  #Labor: Progressing. S/p foley balloon and AROM.  Will continue pitocin. Anticipate VBAC.  #Pain: epidural  #FWB: Category 1 tracing  #GBS positive, PCN    Dominque Levandowski, DO 3:22 PM

## 2020-03-07 NOTE — Progress Notes (Addendum)
Induction of labor for post-dates. Patient has slowly progressed but has been same exam for past 4+ hours despite adequate ctx. FHR has been mostly Cat II over past several hours with moderate variability. Patient refuses further TOLAC at this time and wishes to proceed with c-section as she is nearing 44 hours of induction. D/w Dr. Mayford Knife.  The risks of cesarean section discussed with the patient included but were not limited to: bleeding which may require transfusion or reoperation; infection which may require antibiotics; injury to bowel, bladder, ureters or other surrounding organs; injury to the fetus; need for additional procedures including hysterectomy in the event of a life-threatening hemorrhage; placental abnormalities wth subsequent pregnancies, incisional problems, thromboembolic phenomenon and other postoperative/anesthesia complications. The patient concurred with the proposed plan, giving informed written consent for the procedure.   Patient has been NPO for solids since midnight she will remain NPO for procedure. Anesthesia and OR aware. Preoperative prophylactic antibiotics and SCDs ordered on call to the OR.  To OR when ready.  Jerilynn Birkenhead, MD [facility] Fellow, [facility] for [employer], [facility]

## 2020-03-07 NOTE — Transfer of Care (Signed)
Immediate Anesthesia Transfer of Care Note  Patient: Deborah White  Procedure(s) Performed: CESAREAN SECTION  Patient Location: PACU  Anesthesia Type:Epidural  Level of Consciousness: awake, alert , oriented and patient cooperative  Airway & Oxygen Therapy: Patient Spontanous Breathing  Post-op Assessment: Report given to RN and Post -op Vital signs reviewed and stable  Post vital signs: Reviewed and stable  Last Vitals:  Vitals Value Taken Time  BP 122/86 03/07/20 2134  Temp    Pulse 99 03/07/20 2137  Resp 21 03/07/20 2137  SpO2 100 % 03/07/20 2137  Vitals shown include unvalidated device data.  Last Pain:  Vitals:   03/07/20 1947  TempSrc: Oral  PainSc:       Patients Stated Pain Goal: 10 (03/06/20 0730)  Complications: No apparent anesthesia complications

## 2020-03-07 NOTE — Anesthesia Postprocedure Evaluation (Signed)
Anesthesia Post Note  Patient: Deborah White  Procedure(s) Performed: CESAREAN SECTION     Patient location during evaluation: PACU Anesthesia Type: Epidural Level of consciousness: oriented and awake and alert Pain management: pain level controlled Vital Signs Assessment: post-procedure vital signs reviewed and stable Respiratory status: spontaneous breathing, respiratory function stable and patient connected to nasal cannula oxygen Cardiovascular status: blood pressure returned to baseline and stable Postop Assessment: no headache, no backache, no apparent nausea or vomiting and epidural receding Anesthetic complications: no    Last Vitals:  Vitals:   03/07/20 2245 03/07/20 2300  BP: 125/73 (!) 130/59  Pulse: 82 80  Resp: (!) 21 18  Temp:  37.1 C  SpO2: 96% 100%    Last Pain:  Vitals:   03/07/20 2345  TempSrc:   PainSc: 0-No pain   Pain Goal: Patients Stated Pain Goal: 10 (03/06/20 0730)              Epidural/Spinal Function Cutaneous sensation: Tingles (03/07/20 2300), Patient able to flex knees: Yes (03/07/20 2300), Patient able to lift hips off bed: Yes (03/07/20 2300), Back pain beyond tenderness at insertion site: No (03/07/20 2300), Progressively worsening motor and/or sensory loss: No (03/07/20 2300), Bowel and/or bladder incontinence post epidural: No (03/07/20 2300)  [name] L Kanton Kamel

## 2020-03-07 NOTE — Progress Notes (Signed)
Patient ID: Deborah White, female   DOB: September 06, 1986, 34 y.o.   MRN: [id number] Comfortable, sleeping  Vitals:   03/07/20 0300 03/07/20 0330 03/07/20 0400 03/07/20 0430  BP: (!) 106/55 104/60 108/61 (!) 104/53  Pulse: 76 75 74 71  Resp: 18 18 18 18   Temp:  98.4 F (36.9 C)    TempSrc:  Axillary    SpO2:      Weight:      Height:       FHR nonreactive with small variable decels with some contractions UCs every 5 min  Cervical exam deferred  Will continue to observe

## 2020-03-07 NOTE — Op Note (Signed)
Deborah White PROCEDURE DATE: 03/07/2020  PREOPERATIVE DIAGNOSES: Intrauterine pregnancy at [redacted]w[redacted]d weeks gestation; failure to progress: arrest of descent, failure to progress: arrest of dilation and declines further TOLAC  POSTOPERATIVE DIAGNOSES: The same  PROCEDURE: Repeat Low Transverse Cesarean Section  SURGEON:  Dr. Juanna Cao - Primary Dr. Barrington Ellison - Fellow  ANESTHESIOLOGY TEAM: Anesthesiologist: Freddrick March, MD CRNA: Alvy Bimler, CRNA  INDICATIONS: Deborah White is a 34 y.o. G2P1001 at [redacted]w[redacted]d here for cesarean section secondary to the indications listed under preoperative diagnoses; please see preoperative note for further details.  The risks of cesarean section were discussed with the patient including but were not limited to: bleeding which may require transfusion or reoperation; infection which may require antibiotics; injury to bowel, bladder, ureters or other surrounding organs; injury to the fetus; need for additional procedures including hysterectomy in the event of a life-threatening hemorrhage; placental abnormalities wth subsequent pregnancies, incisional problems, thromboembolic phenomenon and other postoperative/anesthesia complications.   The patient concurred with the proposed plan, giving informed written consent for the procedure.    FINDINGS:  Viable female infant in cephalic presentation. Clear amniotic fluid.  Intact placenta, three vessel cord.  Normal uterus, fallopian tubes and ovaries bilaterally. APGAR (1 MIN): 8   APGAR (5 MINS): 9   APGAR (10 MINS):    ANESTHESIA: Epidural  INTRAVENOUS FLUIDS: 600 ml   ESTIMATED BLOOD LOSS: 459 ml URINE OUTPUT:  225 ml SPECIMENS: Placenta sent to L&D COMPLICATIONS: None immediate  PROCEDURE IN DETAIL:  The patient preoperatively received intravenous antibiotics and had sequential compression devices applied to her lower extremities.  She was then taken to the operating room where  the epidural anesthesia was dosed up to surgical level and was found to be adequate. She was then placed in a dorsal supine position with a leftward tilt, and prepped and draped in a sterile manner.  A foley catheter had been previously placed into her bladder and attached to constant gravity.  After an adequate timeout was performed, a Pfannenstiel skin incision was made with scalpel on her preexisting scar and carried through to the underlying layer of fascia. The fascia was incised in the midline, and this incision was extended bilaterally using the Mayo scissors.  Kocher clamps were applied to the superior aspect of the fascial incision and the underlying rectus muscles were dissected off bluntly and sharply. The rectus muscles were separated in the midline and the peritoneum was entered bluntly. The Alexis self-retaining retractor was introduced into the abdominal cavity.  Attention was turned to the lower uterine segment where a low transverse hysterotomy was made with a scalpel and extended bilaterally bluntly.  The infant was successfully delivered, the cord was clamped and cut after one minute, and the infant was handed over to the awaiting neonatology team. Uterine massage was then administered, and the placenta delivered intact with a three-vessel cord. The uterus was then cleared of clots and debris.  The hysterotomy was closed with 0 Vicryl in a running locked fashion, and an imbricating layer was also placed with 0 Vicryl. Figure-of-eight 0 Vicryl serosal stitches were placed to help with hemostasis.  The pelvis was cleared of all clot and debris. Hemostasis was confirmed on all surfaces.  The retractor was removed.  The peritoneum was closed with a 2-0 Vicryl running stitch. The fascia was then closed using 0 Vicryl in a running fashion.  The subcutaneous layer was irrigated, reapproximated with 2-0 Vicryl running stitches and the skin was  closed with a 4-0 Vicryl subcuticular stitch. The patient  tolerated the procedure well. Sponge, instrument and needle counts were correct x 3.  She was taken to the recovery room in stable condition.   Jerilynn Birkenhead, MD [facility] Fellow, [facility] for [employer], [facility]

## 2020-03-07 NOTE — Progress Notes (Addendum)
Labor Progress Note Chelesa Shareeka Yim is a 34 y.o. G2P1001 at [redacted]w[redacted]d presented for IOL for post dates.  S: Continues to feel tired. She is willing to keep going for vaginal delivery for another 1-2 hours.  O:  BP 108/62   Pulse 99   Temp 98.2 F (36.8 C) (Oral)   Resp 18   Ht 5\' 6"  (1.676 m)   Wt 93.4 kg   LMP 05/24/2019   SpO2 98%   BMI 33.25 kg/m  EFM: 160s, no accels currently, moderate variability, late decels  TOCO: q73m   CVE: Dilation: 7 Effacement (%): 90 Cervical Position: Anterior Station: 0 Presentation: Vertex Exam by:: Dr. [email or web address] and Morene Antu, RN    A&P: 34 y.o. G2P1001 [redacted]w[redacted]d here for IOL for post dates.  #Labor: TOLAC. Slow progress throughout the day. S/p foley balloon and AROM. Cont Pit. Will plan to recheck in 1-2 hours and if no change, will likely proceed with c-section. Cont position changes. Will try amnioinfusion. #Pain: epidural  #FWB: Category II; reassuring for moderate variability  #GBS positive, PCN    [redacted]w[redacted]d, MD 5:45 PM

## 2020-03-07 NOTE — Progress Notes (Signed)
Labor Progress Note Deborah White is a 34 y.o. G2P1001 at [redacted]w[redacted]d presented for IOL for post dates.  S: Patient comfortable with epidural and agreeing to pursue vaginal delivery.   O:  BP (!) 99/54   Pulse 78   Temp 98.1 F (36.7 C) (Oral)   Resp 18   Ht 5\' 6"  (1.676 m)   Wt 93.4 kg   LMP 05/24/2019   SpO2 98%   BMI 33.25 kg/m  EFM: 150, moderate variability, accels present, intermittent late decels TOCO: every 2-3 minutes   CVE: Dilation: 6 Effacement (%): 70 Cervical Position: Anterior Station: -1 Presentation: Vertex Exam by:: [email or web address], RN   A&P: 34 y.o. G2P1001 [redacted]w[redacted]d IOL for PD  #Labor: s/p foley balloon and AROM, continue pitocin. Continue position changes. Anticipate VBAC.  #Pain: epidural  #FWB: category 2, reassuring for moderate variability and accels  #GBS positive, PCN    Deborah Simao, DO 10:59 AM

## 2020-03-08 ENCOUNTER — Encounter (HOSPITAL_COMMUNITY): Payer: Self-pay | Admitting: Family Medicine

## 2020-03-08 LAB — CREATININE, SERUM
Creatinine, Ser: 0.59 mg/dL (ref 0.44–1.00)
GFR calc Af Amer: >60 mL/min (ref >60)
GFR calc non Af Amer: >60 mL/min (ref >60)

## 2020-03-08 LAB — CBC
HCT: 36.4 % (ref 36.0–46.0)
Hemoglobin: 11.8 g/dL — ABNORMAL LOW (ref 12.0–15.0)
MCH: 28.4 pg (ref 26.0–34.0)
MCHC: 32.4 g/dL (ref 30.0–36.0)
MCV: 87.5 fL (ref 80.0–100.0)
Platelets: 215 10*3/uL (ref 150–400)
RBC: 4.16 MIL/uL (ref 3.87–5.11)
RDW: 13.8 % (ref 11.5–15.5)
WBC: 24 10*3/uL — ABNORMAL HIGH (ref 4.0–10.5)
nRBC: 0 % (ref 0.0–0.2)

## 2020-03-08 MED ORDER — LIDOCAINE HCL 1 % IJ SOLN
0.0000 mL | Freq: Once | INTRAMUSCULAR | Status: DC | PRN
  Filled 2020-03-08: qty 20

## 2020-03-08 MED ORDER — ETONOGESTREL 68 MG ~~LOC~~ IMPL
68.0000 mg | DRUG_IMPLANT | Freq: Once | SUBCUTANEOUS | Status: DC
  Filled 2020-03-08: qty 1

## 2020-03-08 MED ORDER — TETANUS-DIPHTH-ACELL PERTUSSIS 5-2.5-18.5 LF-MCG/0.5 IM SUSP: 0.5000 mL | Freq: Once | INTRAMUSCULAR | Status: DC

## 2020-03-08 NOTE — Lactation Note (Signed)
This note was copied from a baby's chart. Lactation Consultation Note Baby 8 hrs old. Baby has been sleepy not interested in BF much. Will feed for about 5 minutes at a time per mom. Mom has a 20 months old and BF him for 3 months.  Mom has great everted nipples. Mom is able to hand express colostrum easily. Collected 20 ml colostrum in a couple of minutes. 10 ml from each breast.  Attempted to latch baby but baby had no interest in BF. Attempted to spoon feed colostrum to stimulate to feed but baby had no interest.  Baby has a significant recessed chin. Encouraged mom to do chin tug to widen flange. Gave mom hand pump for pumping colostrum if baby isn't interested in BF.  Newborn feeding habits, behavior, STS, importance of I&O, cheeks to breast, hand expressing, breast massage, milk storage, positions for feeding, support, supply and demand discussed. Mom asked several good questions. Mom encouraged to feed baby 8-12 times/24 hours and with feeding cues. Mom encouraged to waken baby for feedings if hasn't cued in 3 hrs.  Encouraged to call for assistance or questions. Lactation brochure given.  Patient Name: Deborah White Insco JJHER'D Date: 03/08/2020 Reason for consult: Initial assessment;Term   Maternal Data Has patient been taught Hand Expression?: Yes Does the patient have breastfeeding experience prior to this delivery?: Yes  Feeding Feeding Type: Breast Milk  LATCH Score Latch: Too sleepy or reluctant, no latch achieved, no sucking elicited.  Audible Swallowing: None  Type of Nipple: Everted at rest and after stimulation  Comfort (Breast/Nipple): Soft / non-tender  Hold (Positioning): Full assist, staff holds infant at breast  LATCH Score: 4  Interventions Interventions: Breast feeding basics reviewed;Support pillows;Assisted with latch;Position options;Skin to skin;Expressed milk;Breast massage;Hand express;Breast compression;Adjust position;Hand  pump  Lactation Tools Discussed/Used Tools: Pump Breast pump type: Manual WIC Program: Yes Pump Review: Setup, frequency, and cleaning;Milk Storage Initiated by:: Peri Jefferson RN IBCLC Date initiated:: 03/08/20   Consult Status Consult Status: Follow-up Date: 03/08/20(in pm) Follow-up type: In-patient    Charyl Dancer 03/08/2020, 5:39 AM

## 2020-03-08 NOTE — Progress Notes (Addendum)
Subjective: Postpartum Day 1: Cesarean Delivery Patient reports incisional pain, tolerating PO and + flatus.  Husband states pt's anxiety is getting worse as she is missing her son.   Objective: Vital signs in last 24 hours: Temp:  [97.8 F (36.6 C)-99 F (37.2 C)] 98.2 F (36.8 C) (04/02 0650) Pulse Rate:  [64-99] 78 (04/02 0650) Resp:  [16-29] 18 (04/02 0650) BP: (91-140)/(43-109) 115/61 (04/02 0650) SpO2:  [96 %-100 %] 100 % (04/02 0650)  Physical Exam:  General: alert and no distress Lochia: appropriate Uterine Fundus: firm Incision: healing well, no significant drainage DVT Evaluation: No evidence of DVT seen on physical exam.  Recent Labs    03/06/20 0045 03/08/20 0448  HGB 11.9* 11.8*  HCT 35.6* 36.4    Assessment/Plan: Status post Cesarean section. Doing well postoperatively.  Would like a 24hr discharge, if possible.  Continue current care.  Katha Cabal, DO  03/08/2020, 8:38 AM

## 2020-03-09 MED ORDER — OXYCODONE-ACETAMINOPHEN 5-325 MG PO TABS: 1.0000 | ORAL_TABLET | ORAL | 0 refills | Status: DC | PRN

## 2020-03-09 MED ORDER — IBUPROFEN 800 MG PO TABS: 800.0000 mg | ORAL_TABLET | Freq: Four times a day (QID) | ORAL | 0 refills | Status: DC

## 2020-03-09 NOTE — Lactation Note (Signed)
This note was copied from a baby's chart. Lactation Consultation Note  Patient Name: Deborah White EXNTZ'G Date: 03/09/2020 Reason for consult: Follow-up assessment  P2 mother whose infant is now 22 hours old.  This is a term baby at 41+1 weeks.  Mother breast fed her first child (now 21 months) for 3 months.  Family was visited by pediatrician and lactation consultant early this morning.  Many questions were answered and mother felt more comfortable after speaking with the pediatrician.  Encouraged to continue feeding at least 8-12 times/24 hours or sooner if baby shows feeding cues.  Mother is familiar with hand expression and has been able to obtain EBM.  She has been feeding this back via curved tip syringe.  Mother had no pumping questions.  Baby has had multiple stools and had her first void this morning at 0100.  Discussed voids/stools with parents.  Encouraged continued hand expression and working on breast feeding.  Mother feels like baby has been latching/feeding well. (LATCH scores 8-9).  Baby has a return pediatric first visit on Monday.    Engorgement prevention/treatment reviewed.  Mother has a manual pump and a DEBP for home use.  She has our phone number for questions/concerns after discharge.  Father present.     Maternal Data    Feeding Feeding Type: Breast Fed  LATCH Score                   Interventions    Lactation Tools Discussed/Used     Consult Status Consult Status: Complete Date: 03/09/20 Follow-up type: Call as needed    Kiosha Buchan R Adlai Sinning 03/09/2020, 1:21 PM

## 2020-03-09 NOTE — Progress Notes (Signed)
CSW received consult due to score 14 on Edinburgh Depression Screen.    CSW visited MOB at bedside to offer support and complete assessment. FOB and infant Delton See were present on arrival, however, after PPD and SIDS education, FOB stepped out to of MOB privacy during assessment. MOB and FOB were pleasant and engaged during visit.   MOB reports Edinburgh score is related to difficult pregnancy,  long birthing experience, and being away from her 34 year old son since this past Tuesday. However, MOB reports feeling much better now that infant is here and healthy, and she will be seeing son today. MOB denied any SI, HI, or domestic violence. MOB denied any mental health concerns outside of 2 months of postpartum anxiety after birth of first child. MOB reports she saw a counselor in pediatrician's office during that time and plans to see her again if PPD/A occurs after this child. MOB reported being prescribed medication during that time, but never took it because she did not feel it was needed. MOB reports FOB's support, sleep training, and time management strategies have been helpful with managing anxiety. MOB reports she is currently "super happy she is here and healthy, and excited to be going home to see my son". MOB identified FOB, friend Drinda Butts, FOB's family, and her brother as her support.    CSW provided education regarding the baby blues period vs. perinatal mood disorders, discussed treatment and gave resources for mental health follow up if concerns arise.  CSW recommends self-evaluation during the postpartum time period using the New Mom Checklist from Postpartum Progress and encouraged MOB and FOB to contact a medical professional if symptoms are noted at any time. MOB and FOB asked appropriate questions and stated understanding.   CSW provided review of Sudden Infant Death Syndrome (SIDS) precautions. MOB confirmed having all needed items for baby including new car seat and bassinet for baby's safe  sleep area.    CSW identifies no further need for intervention and no barriers to discharge at this time.  Deborah White, MSW, [facility] Clinical Social Worker [phone]

## 2020-03-09 NOTE — Lactation Note (Signed)
This note was copied from a baby's chart. Lactation Consultation Note  Patient Name: Deborah White GLOVF'I Date: 03/09/2020 Reason for consult: Mother's request;Follow-up assessment P2, 28 hour term female infant, -1% weight loss. Per mom, concern infant is not eating well ask for [facility] services. LC discussed infant's small tummy size, explain dynamics of colostrum and discussed waking techniques to feed infant at breast. Mom has been feeding infant swaddle in blankets and not STS while nursing infant. LC entered room, Charge RN had 10 mls of EBM in curve tip syringe and mom had additional 10 mls in fridge that she had hand express. LC assisted mom in latching infant on right breast using the football hold, infant had deep latch, swallows observed, infant was given 10 mls of EBM while breastfeeding using a curve tip syringe. Infant breastfed for 20 minutes appeared satisfied at the feed of the feeding. LC set mom up with DEBP and mom had pumped an additional 11 mls and was still pumping when LC left the room. Mom knows to pump every 3 hours for 15 minutes on initial setting and give infant back EBM.  Mom will breastfeed infant according to hunger cues, 8 to 12 times within 24 hours and not exceed 3 hours without breastfeeding infant.    Mom knows to call RN or LC if she has any breastfeeding questions, concerns or need assistance with latching infant at breast. Mom shown how to use DEBP & how to disassemble, clean, & reassemble parts. Mom's plan 24-48 hours: 1. Breastfeed infant according feeding cues, 8 to 12 times within 24 hours and not exceed 3 hours without feeding infant. 2. Mom will supplement infant at breast with curve tip syringe or supplement with EBM after latching infant at breast, parents been shown both feeding methods using a curve tip syringe, infant  will be given 10-12 mls of EBM per feeding. 3. Mom will use DEBP every 3 hours for 15 minutes on initial setting. 4.  Mom knows to ask for help if needed with latching infant at breast.   Maternal Data    Feeding Feeding Type: Breast Fed  LATCH Score Latch: Grasps breast easily, tongue down, lips flanged, rhythmical sucking.  Audible Swallowing: Spontaneous and intermittent  Type of Nipple: Everted at rest and after stimulation  Comfort (Breast/Nipple): Soft / non-tender  Hold (Positioning): Assistance needed to correctly position infant at breast and maintain latch.  LATCH Score: 9  Interventions Interventions: Breast feeding basics reviewed;Breast compression;Assisted with latch;Adjust position;Skin to skin;Support pillows;DEBP;Breast massage;Position options;Hand express;Expressed milk  Lactation Tools Discussed/Used Breast pump type: Double-Electric Breast Pump(due infant not latching well at breast earlier and mom's concern.)   Consult Status Consult Status: Follow-up Date: 03/09/20 Follow-up type: In-patient    Danelle Earthly 03/09/2020, 1:11 AM

## 2020-03-11 ENCOUNTER — Telehealth: Payer: Self-pay | Admitting: *Deleted

## 2020-03-11 NOTE — Telephone Encounter (Signed)
Pt called to office stating she was having some swelling after delivery.  Pt had c/s on 03/07/20 and states that only her feet and lower legs are swollen. Pt made aware that she is given lots of IV fluids while in the hospital that can lead to swelling PP.  Pt advised to stay well hydrated, be up and moving as tolerated and prop LE while at rest.  Pt denies any HA, visual changes or dizziness.  Pt advised if she is to have any of these symptoms along with swelling to contact office or be seen at hospital.  Pt states understanding.

## 2020-03-21 ENCOUNTER — Other Ambulatory Visit: Payer: Self-pay

## 2020-03-21 ENCOUNTER — Ambulatory Visit (INDEPENDENT_AMBULATORY_CARE_PROVIDER_SITE_OTHER): Payer: BLUE CROSS/BLUE SHIELD

## 2020-03-21 DIAGNOSIS — Z5189 Encounter for other specified aftercare: Secondary | ICD-10-CM

## 2020-03-21 NOTE — Progress Notes (Signed)
Deborah White is here for wound check.  Incision mainly closed.  There was a small opening noted towards the middle on the right side.  Wound was cleaned and new steri steri strips applied. Pt advised to keep her PP visit. -EH/RMA

## 2020-03-22 NOTE — Progress Notes (Signed)
Patient seen and assessed by nursing staff during this encounter. I have reviewed the chart and agree with the documentation and plan. I have also made any necessary editorial changes.  Catalina Antigua, MD 03/22/2020 8:44 AM

## 2020-04-05 ENCOUNTER — Ambulatory Visit: Payer: BLUE CROSS/BLUE SHIELD | Admitting: Family Medicine

## 2020-04-23 ENCOUNTER — Ambulatory Visit (INDEPENDENT_AMBULATORY_CARE_PROVIDER_SITE_OTHER): Payer: BLUE CROSS/BLUE SHIELD | Admitting: Obstetrics and Gynecology

## 2020-04-23 ENCOUNTER — Encounter: Payer: Self-pay | Admitting: Obstetrics and Gynecology

## 2020-04-23 ENCOUNTER — Other Ambulatory Visit: Payer: Self-pay

## 2020-04-23 NOTE — Progress Notes (Signed)
    Post Partum Visit Note  Deborah White is a 34 y.o. G35P2002 female who presents for a postpartum visit. She is 6 weeks postpartum following a secondary cesarean section.  I have fully reviewed the prenatal and intrapartum course. The delivery was at 41 gestational weeks.  Anesthesia: spinal. Postpartum course has been unremarkable. Baby is doing well. Baby is feeding by bottle - Elecare. Bleeding no bleeding. Bowel function is normal. Bladder function is normal. Patient is sexually active. Contraception method is Nexplanon. Postpartum depression screening: negative. Score 5  The following portions of the patient's history were reviewed and updated as appropriate: allergies, current medications, past family history, past medical history, past social history, past surgical history and problem list.  Review of Systems Pertinent items are noted in HPI.    Objective:  unknown if currently breastfeeding.  General:  alert   Breasts:  not examined  Lungs: clear to auscultation bilaterally  Heart:  regular rate and rhythm, S1, S2 normal, no murmur, click, rub or gallop  Abdomen: soft, non-tender; bowel sounds normal; no masses,  no organomegaly, incision well healed   Vulva:  not evaluated  Vagina: not evaluated  Cervix:  not examined  Corpus: not examined  Adnexa:  not evaluated  Rectal Exam: Not performed.        Assessment:    Nl postpartum exam. Pap smear not done at today's visit.  Desires Implanon but had intercourse 2 days ago.  Plan:   Essential components of care per ACOG recommendations:  1.  Mood and well being: Patient with negative depression screening today. Reviewed local resources for support.  - Patient does not use tobacco.- hx of drug use? No     2. Infant care and feeding:  -Patient currently breastmilk feeding? No -Social determinants of health (SDOH) reviewed in EPIC. No concerns  3. Sexuality, contraception and birth spacing - Patient does not  want a pregnancy in the next year.  Desired family size is uncertain   - Reviewed forms of contraception in tiered fashion. Patient desired Implanon today.  Pt had IC 2 days ago. Instructed to reframe from IC for 2 weeks. Will return in 2 weeks for insertion. Information provided - Discussed birth spacing of 18 months  4. Sleep and fatigue -Encouraged family/partner/community support of 4 hrs of uninterrupted sleep to help with mood and fatigue  5. Physical Recovery  - Discussed patients delivery  - Patient has urinary incontinence? No - Patient is safe to resume physical and sexual activity except as noted above  6.  Health Maintenance - Last pap smear done 09/18/2019 and was normal with positive high risk HPV, repeat with HPV co testing in 1  Yr as per ASCCP guidelines   7. No Chronic Disease - PCP follow up PRN  Frutoso Chase, RN Center for [employer], [facility]

## 2020-04-23 NOTE — Patient Instructions (Signed)
Health Maintenance, Female Adopting a healthy lifestyle and getting preventive care are important in promoting health and wellness. Ask your health care provider about:  The right schedule for you to have regular tests and exams.  Things you can do on your own to prevent diseases and keep yourself healthy. What should I know about diet, weight, and exercise? Eat a healthy diet   Eat a diet that includes plenty of vegetables, fruits, low-fat dairy products, and lean protein.  Do not eat a lot of foods that are high in solid fats, added sugars, or sodium. Maintain a healthy weight Body mass index (BMI) is used to identify weight problems. It estimates body fat based on height and weight. Your health care provider can help determine your BMI and help you achieve or maintain a healthy weight. Get regular exercise Get regular exercise. This is one of the most important things you can do for your health. Most adults should:  Exercise for at least 150 minutes each week. The exercise should increase your heart rate and make you sweat (moderate-intensity exercise).  Do strengthening exercises at least twice a week. This is in addition to the moderate-intensity exercise.  Spend less time sitting. Even light physical activity can be beneficial. Watch cholesterol and blood lipids Have your blood tested for lipids and cholesterol at 34 years of age, then have this test every 5 years. Have your cholesterol levels checked more often if:  Your lipid or cholesterol levels are high.  You are older than 34 years of age.  You are at high risk for heart disease. What should I know about cancer screening? Depending on your health history and family history, you may need to have cancer screening at various ages. This may include screening for:  Breast cancer.  Cervical cancer.  Colorectal cancer.  Skin cancer.  Lung cancer. What should I know about heart disease, diabetes, and high blood  pressure? Blood pressure and heart disease  High blood pressure causes heart disease and increases the risk of stroke. This is more likely to develop in people who have high blood pressure readings, are of African descent, or are overweight.  Have your blood pressure checked: ? Every 3-5 years if you are 18-39 years of age. ? Every year if you are 40 years old or older. Diabetes Have regular diabetes screenings. This checks your fasting blood sugar level. Have the screening done:  Once every three years after age 40 if you are at a normal weight and have a low risk for diabetes.  More often and at a younger age if you are overweight or have a high risk for diabetes. What should I know about preventing infection? Hepatitis B If you have a higher risk for hepatitis B, you should be screened for this virus. Talk with your health care provider to find out if you are at risk for hepatitis B infection. Hepatitis C Testing is recommended for:  Everyone born from 1945 through 1965.  Anyone with known risk factors for hepatitis C. Sexually transmitted infections (STIs)  Get screened for STIs, including gonorrhea and chlamydia, if: ? You are sexually active and are younger than 34 years of age. ? You are older than 34 years of age and your health care provider tells you that you are at risk for this type of infection. ? Your sexual activity has changed since you were last screened, and you are at increased risk for chlamydia or gonorrhea. Ask your health care provider if   you are at risk.  Ask your health care provider about whether you are at high risk for HIV. Your health care provider may recommend a prescription medicine to help prevent HIV infection. If you choose to take medicine to prevent HIV, you should first get tested for HIV. You should then be tested every 3 months for as long as you are taking the medicine. Pregnancy  If you are about to stop having your period (premenopausal) and  you may become pregnant, seek counseling before you get pregnant.  Take 400 to 800 micrograms (mcg) of folic acid every day if you become pregnant.  Ask for birth control (contraception) if you want to prevent pregnancy. Osteoporosis and menopause Osteoporosis is a disease in which the bones lose minerals and strength with aging. This can result in bone fractures. If you are 65 years old or older, or if you are at risk for osteoporosis and fractures, ask your health care provider if you should:  Be screened for bone loss.  Take a calcium or vitamin D supplement to lower your risk of fractures.  Be given hormone replacement therapy (HRT) to treat symptoms of menopause. Follow these instructions at home: Lifestyle  Do not use any products that contain nicotine or tobacco, such as cigarettes, e-cigarettes, and chewing tobacco. If you need help quitting, ask your health care provider.  Do not use street drugs.  Do not share needles.  Ask your health care provider for help if you need support or information about quitting drugs. Alcohol use  Do not drink alcohol if: ? Your health care provider tells you not to drink. ? You are pregnant, may be pregnant, or are planning to become pregnant.  If you drink alcohol: ? Limit how much you use to 0-1 drink a day. ? Limit intake if you are breastfeeding.  Be aware of how much alcohol is in your drink. In the [place] one drink equals one 12 oz bottle of beer (355 mL), one 5 oz glass of wine (148 mL), or one 1 oz glass of hard liquor (44 mL). General instructions  Schedule regular health, dental, and eye exams.  Stay current with your vaccines.  Tell your health care provider if: ? You often feel depressed. ? You have ever been abused or do not feel safe at home. Summary  Adopting a healthy lifestyle and getting preventive care are important in promoting health and wellness.  Follow your health care provider's instructions about healthy  diet, exercising, and getting tested or screened for diseases.  Follow your health care provider's instructions on monitoring your cholesterol and blood pressure. This information is not intended to replace advice given to you by your health care provider. Make sure you discuss any questions you have with your health care provider. Document Revised: 11/16/2018 Document Reviewed: 11/16/2018 Elsevier Patient Education  2020 Elsevier Inc.  

## 2020-05-09 ENCOUNTER — Ambulatory Visit (INDEPENDENT_AMBULATORY_CARE_PROVIDER_SITE_OTHER): Payer: BLUE CROSS/BLUE SHIELD | Admitting: Obstetrics and Gynecology

## 2020-05-09 ENCOUNTER — Other Ambulatory Visit: Payer: Self-pay

## 2020-05-09 ENCOUNTER — Encounter: Payer: Self-pay | Admitting: Obstetrics and Gynecology

## 2020-05-09 VITALS — BP 114/81 | HR 89 | Wt 220.7 lb

## 2020-05-09 DIAGNOSIS — Z3202 Encounter for pregnancy test, result negative: Secondary | ICD-10-CM | POA: Diagnosis not present

## 2020-05-09 DIAGNOSIS — Z30017 Encounter for initial prescription of implantable subdermal contraceptive: Secondary | ICD-10-CM | POA: Diagnosis not present

## 2020-05-09 LAB — POCT URINE PREGNANCY: Preg Test, Ur: NEGATIVE

## 2020-05-09 MED ORDER — ETONOGESTREL 68 MG ~~LOC~~ IMPL
68.0000 mg | DRUG_IMPLANT | Freq: Once | SUBCUTANEOUS | Status: AC
  Administered 2020-05-09: 68 mg via SUBCUTANEOUS

## 2020-05-09 NOTE — Progress Notes (Signed)
     GYNECOLOGY CLINIC PROCEDURE NOTE  Deborah White is a 34 y.o. [id number] here for Nexplanon insertion.    Nexplanon Insertion Procedure Patient identified, informed consent performed, consent signed.   Patient does understand that irregular bleeding is a very common side effect of this medication. She was advised to have backup contraception for one week after placement. Pregnancy test in clinic today was negative.  Appropriate time out taken.  Patient's left arm was prepped and draped in the usual sterile fashion.. The ruler used to measure and mark insertion area.  Patient was prepped with alcohol swab and then injected with 3 ml of 1% lidocaine.  She was prepped with betadine, Nexplanon removed from packaging,  Device confirmed in needle, then inserted full length of needle and withdrawn per handbook instructions. Nexplanon was able to palpated in the patient's arm; patient palpated the insert herself. There was minimal blood loss.  Patient insertion site covered with guaze and a pressure bandage to reduce any bruising.  The patient tolerated the procedure well and was given post procedure instructions.    Nettie Elm, MD, FACOG Attending Obstetrician & Gynecologist Center for [facility], [facility]

## 2020-05-09 NOTE — Patient Instructions (Signed)
Etonogestrel implant What is this medicine? ETONOGESTREL (et oh noe JES trel) is a contraceptive (birth control) device. It is used to prevent pregnancy. It can be used for up to 3 years. This medicine may be used for other purposes; ask your health care provider or pharmacist if you have questions. COMMON BRAND NAME(S): Implanon, Nexplanon What should I tell my health care provider before I take this medicine? They need to know if you have any of these conditions:  abnormal vaginal bleeding  blood vessel disease or blood clots  breast, cervical, endometrial, ovarian, liver, or uterine cancer  diabetes  gallbladder disease  heart disease or recent heart attack  high blood pressure  high cholesterol or triglycerides  kidney disease  liver disease  migraine headaches  seizures  stroke  tobacco smoker  an unusual or allergic reaction to etonogestrel, anesthetics or antiseptics, other medicines, foods, dyes, or preservatives  pregnant or trying to get pregnant  breast-feeding How should I use this medicine? This device is inserted just under the skin on the inner side of your upper arm by a health care professional. Talk to your pediatrician regarding the use of this medicine in children. Special care may be needed. Overdosage: If you think you have taken too much of this medicine contact a poison control center or emergency room at once. NOTE: This medicine is only for you. Do not share this medicine with others. What if I miss a dose? This does not apply. What may interact with this medicine? Do not take this medicine with any of the following medications:  amprenavir  fosamprenavir This medicine may also interact with the following medications:  acitretin  aprepitant  armodafinil  bexarotene  bosentan  carbamazepine  certain medicines for fungal infections like fluconazole, ketoconazole, itraconazole and voriconazole  certain medicines to treat  hepatitis, HIV or AIDS  cyclosporine  felbamate  griseofulvin  lamotrigine  modafinil  oxcarbazepine  phenobarbital  phenytoin  primidone  rifabutin  rifampin  rifapentine  St. John's wort  topiramate This list may not describe all possible interactions. Give your health care provider a list of all the medicines, herbs, non-prescription drugs, or dietary supplements you use. Also tell them if you smoke, drink alcohol, or use illegal drugs. Some items may interact with your medicine. What should I watch for while using this medicine? This product does not protect you against HIV infection (AIDS) or other sexually transmitted diseases. You should be able to feel the implant by pressing your fingertips over the skin where it was inserted. Contact your doctor if you cannot feel the implant, and use a non-hormonal birth control method (such as condoms) until your doctor confirms that the implant is in place. Contact your doctor if you think that the implant may have broken or become bent while in your arm. You will receive a user card from your health care provider after the implant is inserted. The card is a record of the location of the implant in your upper arm and when it should be removed. Keep this card with your health records. What side effects may I notice from receiving this medicine? Side effects that you should report to your doctor or health care professional as soon as possible:  allergic reactions like skin rash, itching or hives, swelling of the face, lips, or tongue  breast lumps, breast tissue changes, or discharge  breathing problems  changes in emotions or moods  coughing up blood  if you feel that the implant   may have broken or bent while in your arm  high blood pressure  pain, irritation, swelling, or bruising at the insertion site  scar at site of insertion  signs of infection at the insertion site such as fever, and skin redness, pain or  discharge  signs and symptoms of a blood clot such as breathing problems; changes in vision; chest pain; severe, sudden headache; pain, swelling, warmth in the leg; trouble speaking; sudden numbness or weakness of the face, arm or leg  signs and symptoms of liver injury like dark yellow or brown urine; general ill feeling or flu-like symptoms; light-colored stools; loss of appetite; nausea; right upper belly pain; unusually weak or tired; yellowing of the eyes or skin  unusual vaginal bleeding, discharge Side effects that usually do not require medical attention (report to your doctor or health care professional if they continue or are bothersome):  acne  breast pain or tenderness  headache  irregular menstrual bleeding  nausea This list may not describe all possible side effects. Call your doctor for medical advice about side effects. You may report side effects to FDA at 1-800-FDA-1088. Where should I keep my medicine? This drug is given in a hospital or clinic and will not be stored at home. NOTE: This sheet is a summary. It may not cover all possible information. If you have questions about this medicine, talk to your doctor, pharmacist, or health care provider.  2020 Elsevier/Gold Standard (2019-09-05 11:33:04)  

## 2020-05-09 NOTE — Progress Notes (Signed)
Pt presents for Nexplanon. Denies IC x 14 days

## 2020-05-27 ENCOUNTER — Ambulatory Visit (INDEPENDENT_AMBULATORY_CARE_PROVIDER_SITE_OTHER): Payer: BLUE CROSS/BLUE SHIELD | Admitting: Licensed Clinical Social Worker

## 2020-05-27 ENCOUNTER — Other Ambulatory Visit: Payer: Self-pay

## 2020-05-27 DIAGNOSIS — F53 Postpartum depression: Secondary | ICD-10-CM | POA: Diagnosis not present

## 2020-05-27 DIAGNOSIS — O99345 Other mental disorders complicating the puerperium: Secondary | ICD-10-CM | POA: Diagnosis not present

## 2020-05-27 MED ORDER — SERTRALINE HCL 50 MG PO TABS: 50.0000 mg | ORAL_TABLET | Freq: Every day | ORAL | 3 refills | Status: DC

## 2020-05-27 NOTE — BH Specialist Note (Signed)
Integrated Behavioral Health Initial Visit  MRN: [id number] Name: Starr Urias  Number of Integrated Behavioral Health Clinician visits:: 1 Session Start time: 1:06pm  Session End time: 1:53pm Total time: 47 mins via face to face at [facility]   Type of Service: Integrated Behavioral Health- Individual Interpretor: No  Interpretor Name and Language: None    Warm Hand Off Completed.       SUBJECTIVE: Shaquera Ansley is a 34 y.o. female accompanied by n/a Patient was referred by self for postpartum depression. Patient reports the following symptoms/concerns: postpartum depression  Duration of problem: two months; Severity of problem: moderate   OBJECTIVE: Mood: Sad  and Affect: Tearful  Risk of harm to self or others: No risk of harm to self or others.   LIFE CONTEXT: Family and Social: Limited family support; spouse works 3rd shift and School/Work: n/a Self-Care: None  Life Changes: Short interval between pregnancies.   GOALS ADDRESSED: Patient will: 1. Reduce symptoms of: postpartum depression. Patient reports being sad, overwhelmed, angry, sleep deprivation and feeling of guilt  2. Increase knowledge of diagnosis and learn coping skills to alleviate symptoms  3. Demonstrate ability to: self manage symptoms   INTERVENTIONS: Interventions utilized: supportive counseling  Standardized Assessments completed: edinburgh score 22  ASSESSMENT: Patient currently experiencing postpartum depression    Patient may benefit from integrated behavioral health   PLAN: 1. Follow up with behavioral health clinician on : 06/18/2020 2. Behavioral recommendations: complete tasks in small obtainable steps, prioritize sleep, create parenting plan with spouse, take all prescribed medication, and include a plan for self care  3. Referral(s): none  4. "From scale of 1-10, how likely are you to follow plan?":   Gwyndolyn Saxon, LCSW

## 2020-06-18 ENCOUNTER — Ambulatory Visit: Payer: BLUE CROSS/BLUE SHIELD | Admitting: Licensed Clinical Social Worker

## 2020-08-09 ENCOUNTER — Ambulatory Visit (INDEPENDENT_AMBULATORY_CARE_PROVIDER_SITE_OTHER): Payer: BLUE CROSS/BLUE SHIELD | Admitting: Obstetrics

## 2020-08-09 ENCOUNTER — Encounter: Payer: Self-pay | Admitting: Obstetrics

## 2020-08-09 ENCOUNTER — Other Ambulatory Visit: Payer: Self-pay

## 2020-08-09 VITALS — BP 116/73 | HR 84 | Wt 231.0 lb

## 2020-08-09 DIAGNOSIS — Z3046 Encounter for surveillance of implantable subdermal contraceptive: Secondary | ICD-10-CM | POA: Diagnosis not present

## 2020-08-09 DIAGNOSIS — Z30011 Encounter for initial prescription of contraceptive pills: Secondary | ICD-10-CM

## 2020-08-09 DIAGNOSIS — Z3009 Encounter for other general counseling and advice on contraception: Secondary | ICD-10-CM | POA: Diagnosis not present

## 2020-08-09 MED ORDER — ORTHO-NOVUM 1/35 (28) 1-35 MG-MCG PO TABS: 1.0000 | ORAL_TABLET | Freq: Every day | ORAL | 3 refills | Status: DC

## 2020-08-09 MED ORDER — ORTHO-NOVUM 1/35 (28) 1-35 MG-MCG PO TABS: 1.0000 | ORAL_TABLET | Freq: Every day | ORAL | 11 refills | Status: DC

## 2020-08-09 NOTE — Progress Notes (Signed)
RGYN patient presents for Nexplanon Removal.  CC: weight gain and vaginal bleeding and mood swings.   Pt states she wants to do Birth Control pills.   Last pap: 09/18/2019 +HPV Neg 16/18/45

## 2020-08-09 NOTE — Progress Notes (Signed)
NEXPLANON REMOVAL NOTE  Date of LMP:   unknown  Contraception used: *Nexplanon   Indications:  The patient desires contraception.  She understands risks, benefits, and alternatives to Implanon and would like to proceed.  Anesthesia:   Lidocaine 1% plain.  Procedure:  A time-out was performed confirming the procedure and the patient's allergy status.  Complications: None                      The rod was palpated and the area was sterilely prepped.  The area beneath the distal tip was anesthetized with 1% xylocaine and the skin incised                       Over the tip and the tip was exposed, grasped with forcep and removed intact.  A suture of 4-0 Vicryl was used to close incision.  Steri strip                       And a bandage applied and the arm was wrapped with gauze bandage.  The patient tolerated well.  Instructions:  The patient was instructed to remove the dressing in 24 hours and that some bruising is to be expected.  She was advised to use over the counter analgesics as needed for any pain at the site.  She is to keep the area dry for 24 hours and to call if her hand or arm becomes cold, numb, or blue.  Return visit:  Return in 2 weeks   Brock Bad, MD 08/09/2020 12:07 PM

## 2020-08-19 ENCOUNTER — Other Ambulatory Visit: Payer: Self-pay

## 2020-08-19 DIAGNOSIS — Z30011 Encounter for initial prescription of contraceptive pills: Secondary | ICD-10-CM

## 2020-08-19 MED ORDER — ORTHO-NOVUM 1/35 (28) 1-35 MG-MCG PO TABS: 1.0000 | ORAL_TABLET | Freq: Every day | ORAL | 3 refills | Status: DC

## 2020-08-23 ENCOUNTER — Ambulatory Visit (INDEPENDENT_AMBULATORY_CARE_PROVIDER_SITE_OTHER): Payer: BLUE CROSS/BLUE SHIELD | Admitting: Obstetrics

## 2020-08-23 ENCOUNTER — Other Ambulatory Visit: Payer: Self-pay

## 2020-08-23 ENCOUNTER — Encounter: Payer: Self-pay | Admitting: Obstetrics

## 2020-08-23 VITALS — BP 114/67 | HR 67 | Wt 229.0 lb

## 2020-08-23 DIAGNOSIS — Z3046 Encounter for surveillance of implantable subdermal contraceptive: Secondary | ICD-10-CM | POA: Diagnosis not present

## 2020-08-23 NOTE — Progress Notes (Addendum)
S/p Nexplanon removal 08/09/20 Pt unable to purchase BCP d/t price.

## 2020-08-23 NOTE — Progress Notes (Signed)
Subjective:    Deborah White is a 34 y.o. female who presents for contraception counseling. The patient has no complaints today. The patient is sexually active. Pertinent past medical history: none.  The information documented in the HPI was reviewed and verified.  Menstrual History: OB History    Gravida  2   Para  2   Term  2   Preterm      AB      Living  2     SAB      TAB      Ectopic      Multiple  0   Live Births  2            No LMP recorded.   Patient Active Problem List   Diagnosis Date Noted  . Insertion of Implanon 05/09/2020  . Postpartum care following cesarean delivery 04/23/2020  . Status post repeat low transverse cesarean section 09/18/2019  . Family history of breast cancer in mother 01/31/2016  . Breast lump in female 11/29/2013   Past Medical History:  Diagnosis Date  . Anemia   . GBS (group B Streptococcus carrier), +RV culture, currently pregnant 02/05/2020  . Thyroid disease     Past Surgical History:  Procedure Laterality Date  . BACK SURGERY  2010   cyst removal  . CESAREAN SECTION N/A 06/11/2018   Procedure: CESAREAN SECTION;  Surgeon: Reva Bores, MD;  Location: [facility];  Service: Obstetrics;  Laterality: N/A;  . CESAREAN SECTION N/A 03/07/2020   Procedure: CESAREAN SECTION;  Surgeon: Malachy Chamber, MD;  Location: [facility] LD [facility]S;  Service: Obstetrics;  Laterality: N/A;  . WRIST SURGERY Left 2011   Cyst     Current Outpatient Medications:  .  b complex vitamins tablet, Take 1 tablet by mouth daily., Disp: , Rfl:  .  norethindrone-ethinyl estradiol 1/35 (ORTHO-NOVUM 1/35, 28,) tablet, Take 1 tablet by mouth daily., Disp: 84 tablet, Rfl: 3 .  acetaminophen (TYLENOL) 500 MG tablet, Take 500 mg by mouth every 6 (six) hours as needed for moderate pain. (Patient not taking: Reported on 08/09/2020), Disp: , Rfl:  .  sertraline (ZOLOFT) 50 MG tablet, Take 1 tablet (50 mg total) by mouth daily. (Patient not  taking: Reported on 08/09/2020), Disp: 30 tablet, Rfl: 3 No Known Allergies  Social History   Tobacco Use  . Smoking status: Former Smoker    Types: Cigarettes    Quit date: 06/23/2016    Years since quitting: 4.1  . Smokeless tobacco: Never Used  Substance Use Topics  . Alcohol use: No    Alcohol/week: 0.0 standard drinks    Family History  Problem Relation Age of Onset  . Breast cancer Mother   . Hypertension Mother   . Cancer Maternal Uncle        brain cancer   . Hypertension Maternal Grandmother        Review of Systems Constitutional: negative for weight loss Genitourinary:negative for abnormal menstrual periods and vaginal discharge   Objective:   BP 114/67   Pulse 67   Wt 229 lb (103.9 kg)   BMI 36.96 kg/m    General:   alert and no distress  Skin:   no rash or abnormalities  Lungs:   clear to auscultation bilaterally  Heart:   regular rate and rhythm, S1, S2 normal, no murmur, click, rub or gallop                 Extremities:  Left Upper Extremity:    Nexplanon removal site is clean, dry and intact.  Non tender.   Lab Review Urine pregnancy test Labs reviewed yes  Radiologic studies reviewed no  50% of 15 min visit spent on counseling and coordination of care.    Assessment:    34 y.o., discontinuing Nexplanon and starting OCP's, no contraindications.   Plan:    All questions answered. Discussed healthy lifestyle modifications. Follow up in 3 months.    Brock Bad, MD 08/23/2020 11:44 AM

## 2020-12-20 ENCOUNTER — Other Ambulatory Visit: Payer: BLUE CROSS/BLUE SHIELD

## 2021-03-31 ENCOUNTER — Ambulatory Visit (HOSPITAL_COMMUNITY)
Admission: EM | Admit: 2021-03-31 | Discharge: 2021-03-31 | Disposition: A | Discharge status: Home / Self Care | Payer: 59 | Attending: Emergency Medicine | Admitting: Emergency Medicine

## 2021-03-31 ENCOUNTER — Encounter (HOSPITAL_COMMUNITY): Payer: Self-pay

## 2021-03-31 ENCOUNTER — Other Ambulatory Visit: Payer: Self-pay

## 2021-03-31 DIAGNOSIS — J040 Acute laryngitis: Secondary | ICD-10-CM | POA: Diagnosis not present

## 2021-03-31 MED ORDER — LIDOCAINE VISCOUS HCL 2 % MT SOLN
OROMUCOSAL | Status: AC
  Filled 2021-03-31: qty 15

## 2021-03-31 MED ORDER — ALUM & MAG HYDROXIDE-SIMETH 200-200-20 MG/5ML PO SUSP
30.0000 mL | Freq: Once | ORAL | Status: AC
  Administered 2021-03-31: 30 mL via ORAL

## 2021-03-31 MED ORDER — PREDNISONE 20 MG PO TABS: 40.0000 mg | ORAL_TABLET | Freq: Every day | ORAL | 0 refills | Status: AC

## 2021-03-31 MED ORDER — CETIRIZINE HCL 10 MG PO TABS: 10.0000 mg | ORAL_TABLET | Freq: Every day | ORAL | 0 refills | Status: DC

## 2021-03-31 MED ORDER — ALUM & MAG HYDROXIDE-SIMETH 200-200-20 MG/5ML PO SUSP
ORAL | Status: AC
  Filled 2021-03-31: qty 30

## 2021-03-31 MED ORDER — LIDOCAINE VISCOUS HCL 2 % MT SOLN
15.0000 mL | Freq: Once | OROMUCOSAL | Status: AC
  Administered 2021-03-31: 15 mL via ORAL

## 2021-03-31 NOTE — Discharge Instructions (Addendum)
Limit speaking, as able.  Warm liquids to drink.  Cough drops or throat gargles to help with symptoms.  Daily zyrtec and 5 days of prednisone to help with symptoms.  If symptoms worsen or do not improve in the next week to return to be seen or to follow up with your PCP.

## 2021-03-31 NOTE — ED Provider Notes (Signed)
[facility]    CSN: [id number] Arrival date & time: 03/31/21  1113      History   Chief Complaint Chief Complaint  Patient presents with  . Sore Throat    HPI Deborah White is a 35 y.o. female.   Deborah White presents with complaints of voice hoarseness and sensation of foreign body to throat, which started around a week ago. Some improvement. 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 No cough or congestion. States she has had somewhat similar in the past, but typically has associated cough. No difficulty swallowing. Has tried different warm beverages and cough drops which have only minimally helped. Denies any heart burn symptoms.    ROS per HPI, negative if not otherwise mentioned.      Past Medical History:  Diagnosis Date  . Anemia   . GBS (group B Streptococcus carrier), +RV culture, currently pregnant 02/05/2020  . Thyroid disease     Patient Active Problem List   Diagnosis Date Noted  . Insertion of Implanon 05/09/2020  . Postpartum care following cesarean delivery 04/23/2020  . Status post repeat low transverse cesarean section 09/18/2019  . Family history of breast cancer in mother 01/31/2016  . Breast lump in female 11/29/2013    Past Surgical History:  Procedure Laterality Date  . BACK SURGERY  2010   cyst removal  . CESAREAN SECTION N/A 06/11/2018   Procedure: CESAREAN SECTION;  Surgeon: Reva Bores, MD;  Location: [facility] SU[facility];  Service: Obstetrics;  Laterality: N/A;  . CESAREAN SECTION N/A 03/07/2020   Procedure: CESAREAN SECTION;  Surgeon: Malachy Chamber, MD;  Location: [facility] LD [facility];  Service: Obstetrics;  Laterality: N/A;  . WRIST SURGERY Left 2011   Cyst    OB History    Gravida  2   Para  2   Term  2   Preterm      AB      Living  2     SAB      IAB      Ectopic      Multiple  0   Live Births  2            Home  Medications    Prior to Admission medications   Medication Sig Start Date End Date Taking? Authorizing Provider  cetirizine (ZYRTEC) 10 MG tablet Take 1 tablet (10 mg total) by mouth daily. 03/31/21  Yes Zeplin Aleshire, Dorene Grebe B, NP  predniSONE (DELTASONE) 20 MG tablet Take 2 tablets (40 mg total) by mouth daily with breakfast for 5 days. 03/31/21 04/05/21 Yes Rollie Hynek, Barron Alvine, NP  acetaminophen (TYLENOL) 500 MG tablet Take 500 mg by mouth every 6 (six) hours as needed for moderate pain. Patient not taking: Reported on 08/09/2020    [provider]  b complex vitamins tablet Take 1 tablet by mouth daily.    [provider]  norethindrone-ethinyl estradiol 1/35 (ORTHO-NOVUM 1/35, 28,) tablet Take 1 tablet by mouth daily. 08/19/20   Brock Bad, MD  sertraline (ZOLOFT) 50 MG tablet Take 1 tablet (50 mg total) by mouth daily. Patient not taking: Reported on 08/09/2020 05/27/20   Constant, Peggy, MD    Family History Family History  Problem Relation Age of Onset  . Breast cancer Mother   . Hypertension Mother   . Cancer Maternal Uncle        brain cancer   .  Hypertension Maternal Grandmother     Social History Social History   Tobacco Use  . Smoking status: Former Smoker    Types: Cigarettes    Quit date: 06/23/2016    Years since quitting: 4.7  . Smokeless tobacco: Never Used  Vaping Use  . Vaping Use: Never used  Substance Use Topics  . Alcohol use: No    Alcohol/week: 0.0 standard drinks  . Drug use: No     Allergies   Patient has no known allergies.   Review of Systems Review of Systems   Physical Exam Triage Vital Signs ED Triage Vitals  Enc Vitals Group     BP 03/31/21 1200 103/85     Pulse Rate 03/31/21 1200 81     Resp 03/31/21 1200 20     Temp 03/31/21 1200 98.5 F (36.9 C)     Temp src --      SpO2 03/31/21 1200 98 %     Weight --      Height --      Head Circumference --      Peak Flow --      Pain Score 03/31/21 1142 7     Pain Loc --       Pain Edu? --      Excl. in GC? --    No data found.  Updated Vital Signs BP 103/85   Pulse 81   Temp 98.5 F (36.9 C)   Resp 20   LMP 03/31/2021 (Approximate)   SpO2 98%   Breastfeeding No   Physical Exam Constitutional:      General: She is not in acute distress.    Appearance: She is well-developed.  HENT:     Mouth/Throat:     Pharynx: Oropharynx is clear. Uvula midline. No pharyngeal swelling.     Tonsils: No tonsillar exudate. 1+ on the right. 1+ on the left.  Cardiovascular:     Rate and Rhythm: Normal rate.  Pulmonary:     Effort: Pulmonary effort is normal.  Skin:    General: Skin is warm and dry.  Neurological:     Mental Status: She is alert and oriented to person, place, and time.      UC Treatments / Results  Labs (all labs ordered are listed, but only abnormal results are displayed) Labs Reviewed - No data to display  EKG   Radiology No results found.  Procedures Procedures (including critical care time)  Medications Ordered in UC Medications  alum & mag hydroxide-simeth (MAALOX/MYLANTA) 200-200-20 MG/5ML suspension 30 mL (has no administration in time range)    And  lidocaine (XYLOCAINE) 2 % viscous mouth solution 15 mL (has no administration in time range)    Initial Impression / Assessment and Plan / UC Course  I have reviewed the triage vital signs and the nursing notes.  Pertinent labs & imaging results that were available during my care of the patient were reviewed by me and considered in my medical decision making (see chart for details).     Non toxic. Benign physical exam. Viral vs allergic vs stress/ overuse at new job. Steroids, vocal rest, fluids and antihistamines recommended. Return precautions provided. Patient verbalized understanding and agreeable to plan.   Final Clinical Impressions(s) / UC Diagnoses   Final diagnoses:  Laryngitis     Discharge Instructions     Limit speaking, as able.  Warm liquids to  drink.  Cough drops or throat gargles to help with symptoms.  Daily zyrtec  and 5 days of prednisone to help with symptoms.  If symptoms worsen or do not improve in the next week to return to be seen or to follow up with your PCP.     ED Prescriptions    Medication Sig Dispense Auth. Provider   predniSONE (DELTASONE) 20 MG tablet Take 2 tablets (40 mg total) by mouth daily with breakfast for 5 days. 10 tablet Linus Mako B, NP   cetirizine (ZYRTEC) 10 MG tablet Take 1 tablet (10 mg total) by mouth daily. 30 tablet Georgetta Haber, NP     PDMP not reviewed this encounter.   Georgetta Haber, NP 03/31/21 1238

## 2021-03-31 NOTE — ED Triage Notes (Signed)
Pt in with c/o ST that has been going on for 1 week.  also c/o voice hoarseness  Pt has been using cough drops and drinking warm tea

## 2021-06-04 ENCOUNTER — Encounter (HOSPITAL_COMMUNITY): Payer: Self-pay

## 2021-06-04 ENCOUNTER — Ambulatory Visit (HOSPITAL_COMMUNITY)
Admission: EM | Admit: 2021-06-04 | Discharge: 2021-06-04 | Disposition: A | Discharge status: Home / Self Care | Payer: 59 | Source: Home / Self Care

## 2021-06-04 ENCOUNTER — Emergency Department (HOSPITAL_COMMUNITY)
Admission: EM | Admit: 2021-06-04 | Discharge: 2021-06-04 | Disposition: A | Discharge status: Home / Self Care | Payer: 59 | Attending: Emergency Medicine | Admitting: Emergency Medicine

## 2021-06-04 ENCOUNTER — Emergency Department (HOSPITAL_COMMUNITY): Payer: 59

## 2021-06-04 ENCOUNTER — Other Ambulatory Visit: Payer: Self-pay

## 2021-06-04 DIAGNOSIS — Z87891 Personal history of nicotine dependence: Secondary | ICD-10-CM | POA: Insufficient documentation

## 2021-06-04 DIAGNOSIS — U071 COVID-19: Secondary | ICD-10-CM | POA: Insufficient documentation

## 2021-06-04 DIAGNOSIS — O9853 Other viral diseases complicating the puerperium: Secondary | ICD-10-CM | POA: Insufficient documentation

## 2021-06-04 DIAGNOSIS — K920 Hematemesis: Secondary | ICD-10-CM

## 2021-06-04 DIAGNOSIS — R109 Unspecified abdominal pain: Secondary | ICD-10-CM | POA: Diagnosis present

## 2021-06-04 DIAGNOSIS — J069 Acute upper respiratory infection, unspecified: Secondary | ICD-10-CM | POA: Diagnosis not present

## 2021-06-04 DIAGNOSIS — R1013 Epigastric pain: Secondary | ICD-10-CM

## 2021-06-04 DIAGNOSIS — O9963 Diseases of the digestive system complicating the puerperium: Secondary | ICD-10-CM | POA: Insufficient documentation

## 2021-06-04 LAB — LIPASE, BLOOD: Lipase: 27 U/L (ref 11–51)

## 2021-06-04 LAB — CBC
HCT: 38.2 % (ref 36.0–46.0)
Hemoglobin: 12.3 g/dL (ref 12.0–15.0)
MCH: 26.2 pg (ref 26.0–34.0)
MCHC: 32.2 g/dL (ref 30.0–36.0)
MCV: 81.3 fL (ref 80.0–100.0)
Platelets: 212 10*3/uL (ref 150–400)
RBC: 4.7 MIL/uL (ref 3.87–5.11)
RDW: 13.1 % (ref 11.5–15.5)
WBC: 6.8 10*3/uL (ref 4.0–10.5)
nRBC: 0 % (ref 0.0–0.2)

## 2021-06-04 LAB — URINALYSIS, ROUTINE W REFLEX MICROSCOPIC
Bilirubin Urine: NEGATIVE
Glucose, UA: NEGATIVE mg/dL
Hgb urine dipstick: NEGATIVE
Ketones, ur: 20 mg/dL — AB
Leukocytes,Ua: NEGATIVE
Nitrite: NEGATIVE
Protein, ur: NEGATIVE mg/dL
Specific Gravity, Urine: 1.019 (ref 1.005–1.030)
pH: 5 (ref 5.0–8.0)

## 2021-06-04 LAB — COMPREHENSIVE METABOLIC PANEL
ALT: 14 U/L (ref 0–44)
AST: 15 U/L (ref 15–41)
Albumin: 4.7 g/dL (ref 3.5–5.0)
Alkaline Phosphatase: 70 U/L (ref 38–126)
Anion gap: 7 (ref 5–15)
BUN: 8 mg/dL (ref 6–20)
CO2: 25 mmol/L (ref 22–32)
Calcium: 8.8 mg/dL — ABNORMAL LOW (ref 8.9–10.3)
Chloride: 105 mmol/L (ref 98–111)
Creatinine, Ser: 0.53 mg/dL (ref 0.44–1.00)
GFR, Estimated: >60 mL/min (ref >60)
Glucose, Bld: 111 mg/dL — ABNORMAL HIGH (ref 70–99)
Potassium: 3.5 mmol/L (ref 3.5–5.1)
Sodium: 137 mmol/L (ref 135–145)
Total Bilirubin: 0.5 mg/dL (ref 0.3–1.2)
Total Protein: 7.8 g/dL (ref 6.5–8.1)

## 2021-06-04 LAB — SARS CORONAVIRUS 2 (TAT 6-24 HRS): SARS Coronavirus 2: POSITIVE — AB

## 2021-06-04 LAB — POCT URINALYSIS DIPSTICK, ED / UC
Bilirubin Urine: NEGATIVE
Glucose, UA: NEGATIVE mg/dL
Ketones, ur: 15 mg/dL — AB
Leukocytes,Ua: NEGATIVE
Nitrite: NEGATIVE
Protein, ur: NEGATIVE mg/dL
Specific Gravity, Urine: >=1.03 (ref 1.005–1.030)
Urobilinogen, UA: 0.2 mg/dL (ref 0.0–1.0)
pH: 5 (ref 5.0–8.0)

## 2021-06-04 LAB — RESP PANEL BY RT-PCR (FLU A&B, COVID) ARPGX2
Influenza A by PCR: NEGATIVE
Influenza B by PCR: NEGATIVE
SARS Coronavirus 2 by RT PCR: POSITIVE — AB

## 2021-06-04 LAB — PREGNANCY, URINE: Preg Test, Ur: NEGATIVE

## 2021-06-04 MED ORDER — SUCRALFATE 1 G PO TABS: 1.0000 g | ORAL_TABLET | Freq: Three times a day (TID) | ORAL | 0 refills | Status: DC

## 2021-06-04 MED ORDER — ONDANSETRON 4 MG PO TBDP: 4.0000 mg | ORAL_TABLET | Freq: Three times a day (TID) | ORAL | 0 refills | Status: DC | PRN

## 2021-06-04 MED ORDER — DIPHENHYDRAMINE HCL 50 MG/ML IJ SOLN
50.0000 mg | Freq: Once | INTRAMUSCULAR | Status: AC
  Administered 2021-06-04: 50 mg via INTRAVENOUS
  Filled 2021-06-04: qty 1

## 2021-06-04 MED ORDER — SODIUM CHLORIDE (PF) 0.9 % IJ SOLN
INTRAMUSCULAR | Status: AC
  Filled 2021-06-04: qty 50

## 2021-06-04 MED ORDER — ACETAMINOPHEN 500 MG PO TABS
1000.0000 mg | ORAL_TABLET | Freq: Once | ORAL | Status: AC
  Administered 2021-06-04: 1000 mg via ORAL
  Filled 2021-06-04: qty 2

## 2021-06-04 MED ORDER — METOCLOPRAMIDE HCL 5 MG/ML IJ SOLN
10.0000 mg | Freq: Once | INTRAMUSCULAR | Status: AC
  Administered 2021-06-04: 10 mg via INTRAVENOUS
  Filled 2021-06-04: qty 2

## 2021-06-04 MED ORDER — PANTOPRAZOLE SODIUM 20 MG PO TBEC: 20.0000 mg | DELAYED_RELEASE_TABLET | Freq: Every day | ORAL | 0 refills | Status: DC

## 2021-06-04 MED ORDER — IOHEXOL 300 MG/ML  SOLN
100.0000 mL | Freq: Once | INTRAMUSCULAR | Status: AC | PRN
  Administered 2021-06-04: 100 mL via INTRAVENOUS

## 2021-06-04 MED ORDER — SODIUM CHLORIDE 0.9 % IV BOLUS
1000.0000 mL | Freq: Once | INTRAVENOUS | Status: AC
  Administered 2021-06-04: 1000 mL via INTRAVENOUS

## 2021-06-04 NOTE — ED Provider Notes (Signed)
[facility]-EMERGENCY DEPT Provider Note   CSN: [id number] Arrival date & time: 06/04/21  3818     History Chief Complaint  Patient presents with   Abdominal Pain    Aireal A Zobrist is a 35 y.o. female with a history of anemia.  Patient presents to the emergency department with a chief complaint of abdominal pain, sore throat, headache, and chills.  Patient reports that her sore throat, headache, and chills started last night.  Headache was gradual in onset and has progressively become worse over time.  Pain is located throughout her entire head.  Patient denies any recent falls or traumatic injuries.  Denies any slurred speech, facial asymmetry, numbness, weakness, seizures, visual disturbance.  Patient reports a sore throat has progressively worsened since beginning last night.  Patient denies any associated trismus, trouble swallowing, drooling, hot potato voice.    Patient reports that abdominal pain began this morning.  Patient endorses epigastric abdominal pain.  Patient rates pain 7/10 the pain scale.  Pain has been intermittent and began after episodes of vomiting.  Patient denies any alleviating factors.  Patient says pain is worse with touch or movement.  Patient has not had any food today.  Patient states that she had 3 episodes of vomiting this morning.  Patient reports with the first episode of vomiting was clear..  2 following episodes of vomiting where dark in color.  Patient denies seeing any frank red blood.  Patient denies that episodes of emesis were violent retching.  Patient denies any history of GI bleed.  Patient denies any regular EtOH or NSAID use.  Patient is not on any blood thinners.  Patient denies any blood in stool, melena, abdominal distention, dysuria, hematuria, urinary frequency, vaginal bleeding, vaginal pain, vaginal discharge, pelvic pain, chest pain, shortness of breath, syncope.  Patient does endorse lightheadedness.  LMP 6/22.  G2  P2-0-0-2.  Both deliveries were cesarean section.  Patient denies any other history of abdominal surgery.  Patient was sent to emergency department from urgent care for further work-up.   Abdominal Pain Associated symptoms: chills, nausea, sore throat and vomiting   Associated symptoms: no chest pain, no constipation, no cough, no diarrhea, no dysuria, no fever, no hematuria, no shortness of breath, no vaginal bleeding and no vaginal discharge       Past Medical History:  Diagnosis Date   Anemia    GBS (group B Streptococcus carrier), +RV culture, currently pregnant 02/05/2020   Thyroid disease     Patient Active Problem List   Diagnosis Date Noted   Insertion of Implanon 05/09/2020   Postpartum care following cesarean delivery 04/23/2020   Status post repeat low transverse cesarean section 09/18/2019   Family history of breast cancer in mother 01/31/2016   Breast lump in female 11/29/2013    Past Surgical History:  Procedure Laterality Date   BACK SURGERY  2010   cyst removal   CESAREAN SECTION N/A 06/11/2018   Procedure: CESAREAN SECTION;  Surgeon: Reva Bores, MD;  Location: [facility] BIRTHING SUITES;  Service: Obstetrics;  Laterality: N/A;   CESAREAN SECTION N/A 03/07/2020   Procedure: CESAREAN SECTION;  Surgeon: Malachy Chamber, MD;  Location: [facility] LD ORS;  Service: Obstetrics;  Laterality: N/A;   WRIST SURGERY Left 2011   Cyst     OB History     Gravida  2   Para  2   Term  2   Preterm      AB  Living  2      SAB      IAB      Ectopic      Multiple  0   Live Births  2           Family History  Problem Relation Age of Onset   Breast cancer Mother    Hypertension Mother    Cancer Maternal Uncle        brain cancer    Hypertension Maternal Grandmother     Social History   Tobacco Use   Smoking status: Former    Pack years: 0.00    Types: Cigarettes    Quit date: 06/23/2016    Years since quitting: 4.9   Smokeless tobacco: Never   Vaping Use   Vaping Use: Never used  Substance Use Topics   Alcohol use: No    Alcohol/week: 0.0 standard drinks   Drug use: No    Home Medications Prior to Admission medications   Medication Sig Start Date End Date Taking? Authorizing Provider  acetaminophen (TYLENOL) 500 MG tablet Take 500 mg by mouth every 6 (six) hours as needed for moderate pain. Patient not taking: Reported on 08/09/2020    [provider]  b complex vitamins tablet Take 1 tablet by mouth daily.    [provider]  cetirizine (ZYRTEC) 10 MG tablet Take 1 tablet (10 mg total) by mouth daily. 03/31/21   Linus Mako B, NP  norethindrone-ethinyl estradiol 1/35 (ORTHO-NOVUM 1/35, 28,) tablet Take 1 tablet by mouth daily. 08/19/20   Brock Bad, MD  sertraline (ZOLOFT) 50 MG tablet Take 1 tablet (50 mg total) by mouth daily. Patient not taking: Reported on 08/09/2020 05/27/20   Constant, Peggy, MD    Allergies    Patient has no known allergies.  Review of Systems   Review of Systems  Constitutional:  Positive for chills. Negative for fever.  HENT:  Positive for sore throat. Negative for congestion and rhinorrhea.   Eyes:  Negative for visual disturbance.  Respiratory:  Negative for cough and shortness of breath.   Cardiovascular:  Negative for chest pain.  Gastrointestinal:  Positive for abdominal pain, nausea and vomiting. Negative for abdominal distention, anal bleeding, blood in stool, constipation, diarrhea and rectal pain.  Genitourinary:  Negative for difficulty urinating, dysuria, frequency, hematuria, vaginal bleeding, vaginal discharge and vaginal pain.  Musculoskeletal:  Positive for myalgias. Negative for back pain and neck pain.  Skin:  Negative for color change and rash.  Allergic/Immunologic: Negative for immunocompromised state.  Neurological:  Positive for headaches. Negative for dizziness, tremors, seizures, syncope, facial asymmetry, speech difficulty, weakness,  light-headedness and numbness.  Hematological:  Does not bruise/bleed easily.  Psychiatric/Behavioral:  Negative for confusion.    Physical Exam Updated Vital Signs BP 124/85   Pulse (!) 108   Temp 98.7 F (37.1 C) (Oral)   Resp 18   LMP 05/28/2021 (Approximate)   SpO2 97%   Physical Exam Vitals and nursing note reviewed.  Constitutional:      General: She is not in acute distress.    Appearance: She is ill-appearing. She is not toxic-appearing or diaphoretic.  HENT:     Head: Normocephalic and atraumatic. No raccoon eyes, Battle's sign, abrasion, contusion, masses, right periorbital erythema, left periorbital erythema or laceration.     Jaw: No trismus, tenderness or pain on movement.     Mouth/Throat:     Mouth: Mucous membranes are moist. No injury, lacerations, oral lesions or angioedema.  Pharynx: Oropharynx is clear. Uvula midline. No pharyngeal swelling, oropharyngeal exudate, posterior oropharyngeal erythema or uvula swelling.     Tonsils: No tonsillar exudate or tonsillar abscesses. 1+ on the right. 1+ on the left.  Eyes:     General: No scleral icterus.       Right eye: No discharge.        Left eye: No discharge.     Extraocular Movements: Extraocular movements intact.     Pupils: Pupils are equal, round, and reactive to light.  Cardiovascular:     Rate and Rhythm: Normal rate.  Pulmonary:     Effort: Pulmonary effort is normal. No bradypnea or respiratory distress.     Breath sounds: Normal breath sounds. No stridor.     Comments: Speaks in full sentences without difficulty Abdominal:     General: Abdomen is flat. Bowel sounds are normal. There is no distension. There are no signs of injury.     Palpations: Abdomen is soft. There is no mass or pulsatile mass.     Tenderness: There is abdominal tenderness in the epigastric area, left upper quadrant and left lower quadrant. There is no guarding or rebound.  Musculoskeletal:     Cervical back: Normal range of  motion and neck supple. No edema, erythema, signs of trauma, rigidity, torticollis or crepitus. No pain with movement, spinous process tenderness or muscular tenderness. Normal range of motion.     Right lower leg: Normal. No swelling or tenderness. No edema.     Left lower leg: Normal. No swelling or tenderness. No edema.  Lymphadenopathy:     Cervical: No cervical adenopathy.  Skin:    General: Skin is warm and dry.     Coloration: Skin is not cyanotic, jaundiced or pale.  Neurological:     General: No focal deficit present.     Mental Status: She is alert and oriented to person, place, and time.     GCS: GCS eye subscore is 4. GCS verbal subscore is 5. GCS motor subscore is 6.     Cranial Nerves: No cranial nerve deficit or facial asymmetry.     Sensory: Sensation is intact.     Motor: No weakness, tremor, seizure activity or pronator drift.     Coordination: Finger-Nose-Finger Test normal.     Comments: CN II-XII intact; performed in supine position, +5 strength to bilateral upper extremities, +5 strength to dorsiflexion and plantarflexion, patient able to left both legs against gravity and hold each there without difficulty, sensation to light touch intact to bilateral upper and lower extremities, strength equal bilaterally.  Psychiatric:        Behavior: Behavior is cooperative.    ED Results / Procedures / Treatments   Labs (all labs ordered are listed, but only abnormal results are displayed) Labs Reviewed  RESP PANEL BY RT-PCR (FLU A&B, COVID) ARPGX2 - Abnormal; Notable for the following components:      Result Value   SARS Coronavirus 2 by RT PCR POSITIVE (*)    All other components within normal limits  COMPREHENSIVE METABOLIC PANEL - Abnormal; Notable for the following components:   Glucose, Bld 111 (*)    Calcium 8.8 (*)    All other components within normal limits  URINALYSIS, ROUTINE W REFLEX MICROSCOPIC - Abnormal; Notable for the following components:   APPearance  HAZY (*)    Ketones, ur 20 (*)    All other components within normal limits  LIPASE, BLOOD  CBC  PREGNANCY, URINE  I-STAT BETA HCG BLOOD, ED ([facility], [facility], [facility] ONLY)    EKG None  Radiology CT ABDOMEN PELVIS W CONTRAST  Result Date: 06/04/2021 CLINICAL DATA:  35 year old female with epigastric pain, nausea vomiting. EXAM: CT ABDOMEN AND PELVIS WITH CONTRAST TECHNIQUE: Multidetector CT imaging of the abdomen and pelvis was performed using the standard protocol following bolus administration of intravenous contrast. CONTRAST:  OMNIPAQUE IOHEXOL 300 MG/ML  SOLN COMPARISON:  None. FINDINGS: Lower chest: The visualized lung bases are clear. No intra-abdominal free air or free fluid. Hepatobiliary: Apparent mild fatty liver. No intrahepatic biliary ductal dilatation. The gallbladder is unremarkable. Pancreas: Unremarkable. No pancreatic ductal dilatation or surrounding inflammatory changes. Spleen: Normal in size without focal abnormality. Adrenals/Urinary Tract: The adrenal glands are unremarkable. The kidneys, visualized ureters, and the urinary bladder appear unremarkable. Stomach/Bowel: There is no bowel obstruction or active inflammation. The appendix is normal. Vascular/Lymphatic: The abdominal aorta and IVC are unremarkable. No portal venous gas. There is no adenopathy. Reproductive: The uterus is anteverted and grossly unremarkable. Bilateral ovarian follicles/small cysts. Other: None Musculoskeletal: Bilateral L5 pars defects. No listhesis. No acute osseous pathology. IMPRESSION: No acute intra-abdominal or pelvic pathology. No bowel obstruction. Normal appendix. Electronically Signed   By: Elgie Collard M.D.   On: 06/04/2021 16:43    Procedures Procedures   Medications Ordered in ED Medications  sodium chloride (PF) 0.9 % injection (has no administration in time range)  sodium chloride (PF) 0.9 % injection (has no administration in time range)  sodium chloride 0.9 % bolus 1,000 mL (0 mLs  Intravenous Stopped 06/04/21 1412)  metoCLOPramide (REGLAN) injection 10 mg (10 mg Intravenous Given 06/04/21 1304)  diphenhydrAMINE (BENADRYL) injection 50 mg (50 mg Intravenous Given 06/04/21 1304)  acetaminophen (TYLENOL) tablet 1,000 mg (1,000 mg Oral Given 06/04/21 1614)  iohexol (OMNIPAQUE) 300 MG/ML solution 100 mL (100 mLs Intravenous Contrast Given 06/04/21 1623)    ED Course  I have reviewed the triage vital signs and the nursing notes.  Pertinent labs & imaging results that were available during my care of the patient were reviewed by me and considered in my medical decision making (see chart for details).    MDM Rules/Calculators/A&P                          Alert ill-appearing 35 year old female no acute distress, nontoxic-appearing.  Patient presents emerged department with a chief complaint of flulike symptoms, hematemesis, and sore throat.  Patient states she had 3 episodes of emesis this morning, one was described as clear with the following to be described as dark in color.  Patient was sent to emergency department from urgent care due to concerns of GI bleed.  Patient's abdomen is soft, nondistended, tenderness to epigastrium, upper quadrant and left lower quadrant.  Patient had 1 episode of vomiting while in the emergency room.  Emesis was found to be clear without blood or coffee-ground emesis. Will obtain CMP, CBC, urinalysis, lipase, pregnancy test, COVID testing.  Patient has no erythema, swelling or exudate noted to tonsils/oropharynx.  Low suspicion for strep pharyngitis at this time.  On physical exam patient has no focal neurological deficits.  Will start patient on Reglan and Benadryl to help with her headache as well as nausea.  We will give patient 1 L fluid bolus.  CMP is unremarkable. CBC shows no signs of leukocytosis or anemia. Lipase within normal limits. Analysis shows no signs of infection. Urine pregnancy test negative. Patient positive for  COVID-19.  Will obtain CT abdomen pelvis to evaluate for liver as a cause of patient's abdominal pain and reports of hematemesis. The abdomen pelvis showed no acute abdominal or pelvic pathology.  Reports resolution of her nausea, headache, and abdominal pain after receiving antiemetics, Tylenol, Benadryl, and fluid bolus.  On serial reexamination abdomen remains soft and nondistended.  She has no further episodes of vomiting.  She has remained hemodynamically stable throughout ED stay.  We will start patient on Protonix, Carafate, and Zofran.  Patient given information to follow-up with gastroenterology.  Patient given strict return precautions.  Patient expressed understanding of instructions and is agreeable with this plan.  Zona A Jean RosenthalJackson was evaluated in Emergency Department on 06/04/2021 for the symptoms described in the history of present illness. She was evaluated in the context of the global COVID-19 pandemic, which necessitated consideration that the patient might be at risk for infection with the SARS-CoV-2 virus that causes COVID-19. Institutional protocols and algorithms that pertain to the evaluation of patients at risk for COVID-19 are in a state of rapid change based on information released by regulatory bodies including the CDC and federal and state organizations. These policies and algorithms were followed during the patient's care in the ED.   Final Clinical Impression(s) / ED Diagnoses Final diagnoses:  COVID-19  Coffee ground emesis    Rx / DC Orders ED Discharge Orders          Ordered    ondansetron (ZOFRAN ODT) 4 MG disintegrating tablet  Every 8 hours PRN        06/04/21 1707    pantoprazole (PROTONIX) 20 MG tablet  Daily        06/04/21 1718    sucralfate (CARAFATE) 1 g tablet  3 times daily with meals & bedtime        06/04/21 1718             Berneice HeinrichBadalamente, Nollan Muldrow R, PA-C 06/04/21 2002    Alvira MondaySchlossman, Erin, MD 06/05/21 0830

## 2021-06-04 NOTE — ED Notes (Signed)
Patient transported to CT 

## 2021-06-04 NOTE — Discharge Instructions (Addendum)
Please go to the emergency room for further evaluation.

## 2021-06-04 NOTE — ED Triage Notes (Signed)
C/o abdominal pain  / epigastric pain that started yesterday.  C/o sore throat, chills, headache, cough.   Patient reports coughing and emesis right after.   Patient reports going to UC for covid test and told to come to ED. Patient did not get the covid test result yet.   A/ox4

## 2021-06-04 NOTE — ED Triage Notes (Addendum)
Pt c/o emesis, upper abdominal pain worsening with palpation, sore throat, headache, chills. Reports vomit was dark/blackish in color. Pt reports symptoms started yesterday.  Took tylenol for symptoms at 0600. Reports she took her temperature last night and was 94.

## 2021-06-04 NOTE — ED Notes (Addendum)
Pt is Covid positive. Received from lab. Oneita Jolly, RN., notified.

## 2021-06-04 NOTE — ED Provider Notes (Signed)
[facility]    CSN: [id number] Arrival date & time: 06/04/21  3846      History   Chief Complaint Chief Complaint  Patient presents with   Abdominal Pain   Emesis   Sore Throat   Headache    HPI Deborah White is a 35 y.o. female.   HPI  Abdominal Pain: Pt reports that since yesterday she has had abdominal pain which is worse in her upper epigastric area, sore throat, headache and chills.  She does report a few episodes of vomiting that were black/dark in coloration. She notes about 3 episodes of vomiting and all recent episodes have been dark and coffee ground like in nature without visualization of frank blood.  She is taking Tylenol for symptoms as of 6 AM.  Of note patient does have a history of anemia with her last hemoglobin on file from 03/08/2020 showing a hemoglobin of 11.8. She denies dysuria, known fevers, chest pain. NO heavy ETOH use, or NSAID use.   Past Medical History:  Diagnosis Date   Anemia    GBS (group B Streptococcus carrier), +RV culture, currently pregnant 02/05/2020   Thyroid disease     Patient Active Problem List   Diagnosis Date Noted   Insertion of Implanon 05/09/2020   Postpartum care following cesarean delivery 04/23/2020   Status post repeat low transverse cesarean section 09/18/2019   Family history of breast cancer in mother 01/31/2016   Breast lump in female 11/29/2013    Past Surgical History:  Procedure Laterality Date   BACK SURGERY  2010   cyst removal   CESAREAN SECTION N/A 06/11/2018   Procedure: CESAREAN SECTION;  Surgeon: Reva Bores, MD;  Location: [facility] BIRTHING SUITES;  Service: Obstetrics;  Laterality: N/A;   CESAREAN SECTION N/A 03/07/2020   Procedure: CESAREAN SECTION;  Surgeon: Malachy Chamber, MD;  Location: [facility] LD ORS;  Service: Obstetrics;  Laterality: N/A;   WRIST SURGERY Left 2011   Cyst    OB History     Gravida  2   Para  2   Term  2   Preterm      AB      Living  2      SAB       IAB      Ectopic      Multiple  0   Live Births  2            Home Medications    Prior to Admission medications   Medication Sig Start Date End Date Taking? Authorizing Provider  b complex vitamins tablet Take 1 tablet by mouth daily.   Yes [provider]  acetaminophen (TYLENOL) 500 MG tablet Take 500 mg by mouth every 6 (six) hours as needed for moderate pain. Patient not taking: Reported on 08/09/2020    [provider]  cetirizine (ZYRTEC) 10 MG tablet Take 1 tablet (10 mg total) by mouth daily. 03/31/21   Linus Mako B, NP  norethindrone-ethinyl estradiol 1/35 (ORTHO-NOVUM 1/35, 28,) tablet Take 1 tablet by mouth daily. 08/19/20   Brock Bad, MD  sertraline (ZOLOFT) 50 MG tablet Take 1 tablet (50 mg total) by mouth daily. Patient not taking: Reported on 08/09/2020 05/27/20   Constant, Peggy, MD    Family History Family History  Problem Relation Age of Onset   Breast cancer Mother    Hypertension Mother    Cancer Maternal Uncle        brain cancer  Hypertension Maternal Grandmother     Social History Social History   Tobacco Use   Smoking status: Former    Pack years: 0.00    Types: Cigarettes    Quit date: 06/23/2016    Years since quitting: 4.9   Smokeless tobacco: Never  Vaping Use   Vaping Use: Never used  Substance Use Topics   Alcohol use: No    Alcohol/week: 0.0 standard drinks   Drug use: No     Allergies   Patient has no known allergies.   Review of Systems Review of Systems  As stated above in HPI Physical Exam Triage Vital Signs ED Triage Vitals  Enc Vitals Group     BP 06/04/21 0916 121/71     Pulse Rate 06/04/21 0916 (!) 109     Resp 06/04/21 0916 18     Temp 06/04/21 0916 98.5 F (36.9 C)     Temp src --      SpO2 06/04/21 0916 97 %     Weight --      Height --      Head Circumference --      Peak Flow --      Pain Score 06/04/21 0914 9     Pain Loc --      Pain Edu? --      Excl. in GC? --     No data found.  Updated Vital Signs BP 121/71   Pulse (!) 109   Temp 98.5 F (36.9 C)   Resp 18   LMP 05/28/2021 (Approximate)   SpO2 97%   Physical Exam Vitals and nursing note reviewed.  HENT:     Mouth/Throat:     Mouth: Mucous membranes are moist.  Eyes:     Comments: NO sign of pallor  Cardiovascular:     Rate and Rhythm: Normal rate and regular rhythm.     Heart sounds: Normal heart sounds.  Pulmonary:     Effort: Pulmonary effort is normal.     Breath sounds: Normal breath sounds.  Abdominal:     General: Abdomen is flat. Bowel sounds are normal. There is no distension.     Palpations: Abdomen is rigid. There is no hepatomegaly, splenomegaly or mass.     Tenderness: There is abdominal tenderness in the epigastric area. There is guarding. There is no right CVA tenderness, left CVA tenderness or rebound. Negative signs include Murphy's sign and McBurney's sign.     Hernia: No hernia is present.  Skin:    General: Skin is warm.     Coloration: Skin is not cyanotic or pale.     Findings: No rash.     UC Treatments / Results  Labs (all labs ordered are listed, but only abnormal results are displayed) Labs Reviewed  SARS CORONAVIRUS 2 (TAT 6-24 HRS)  POCT URINALYSIS DIPSTICK, ED / UC  POC URINE PREG, ED    EKG   Radiology No results found.  Procedures Procedures (including critical care time)  Medications Ordered in UC Medications - No data to display  Initial Impression / Assessment and Plan / UC Course  I have reviewed the triage vital signs and the nursing notes.  Pertinent labs & imaging results that were available during my care of the patient were reviewed by me and considered in my medical decision making (see chart for details).     New.  Likely viral process however she does have symptoms concerning for upper GI bleed.  I discussed this  with patient.  She will go ahead and transfer over to the emergency room for further work-up.  She  prefers to travel via private vehicle. Final Clinical Impressions(s) / UC Diagnoses   Final diagnoses:  None   Discharge Instructions   None    ED Prescriptions   None    PDMP not reviewed this encounter.   Rushie Chestnut, [place] 06/04/21 [phone]

## 2021-06-04 NOTE — Discharge Instructions (Addendum)
You came to the emergency department today to be evaluated for your flulike symptoms, abdominal pain, reports of dark-colored vomit..  Your CT scan showed no acute abnormalities.  Your lab work was reassuring.  Due to your reports of dark-colored emesis there is a concern for bloody emesis.  Due to this I have started you on the medication Protonix and Carafate.  Please take these as prescribed.  I have also given you information to follow-up with a gastroenterologist.  Please call their office to schedule a appointment.    You tested positive for COVID-19. Please isolate at home for at least 7 days after the day your symptoms initially began, and THEN at least 24 hours after you are fever-free without the help of medications (Tylenol/acetaminophen and Advil/ibuprofen/Motrin) AND your symptoms are improving.  You can alternate Tylenol/acetaminophen and Advil/ibuprofen/Motrin every 4 hours for sore throat, body aches, headache or fever.  Drink plenty of water.  Use saline nasal spray for congestion. You can take Zofran every 8 hours as needed for nausea and vomiting. Wash your hands frequently. Please rest as needed with frequent repositioning and ambulation as tolerated.    If you use a CPAP or BiPAP device for management of obstructive sleep apnea may continue to use it however use it when isolated from other individuals to avoid spread of COVID-19.   If you use a nebulizer administer medication such as albuterol you may continue to use it however only one isolated from other individuals to avoid the spread of COVID-19.  If your symptoms do not improve please follow-up with your primary care provider or urgent care.  Return to the ER for significant shortness of breath, uncontrollable vomiting, severe chest pain, inability to tolerate fluids, changes in mental status such as confusion or other concerning symptoms.

## 2021-11-30 IMAGING — CT CT ABD-PELV W/ CM
2 of 4 series · 16 of 46 positions shown, 18 images · IV contrast (OMNIPAQUE 300)
Comparison: None.

CLINICAL DATA: 34-year-old female with epigastric pain, nausea
vomiting.

EXAM:
CT ABDOMEN AND PELVIS WITH CONTRAST
TECHNIQUE: Multidetector CT imaging of the abdomen and pelvis was performed
using the standard protocol following bolus administration of
intravenous contrast.
CONTRAST:  100mL OMNIPAQUE IOHEXOL 300 MG/ML  SOLN

[Series 2: axial st · axial · 0.79mm/px · z∈[+935,+1415]mm · 13 of 109 slices shown, 15 images]
[im 7/109  soft-tissue]
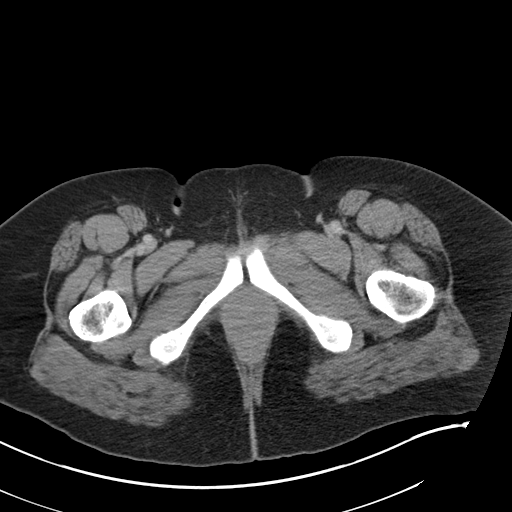
[im 7/109  bone]
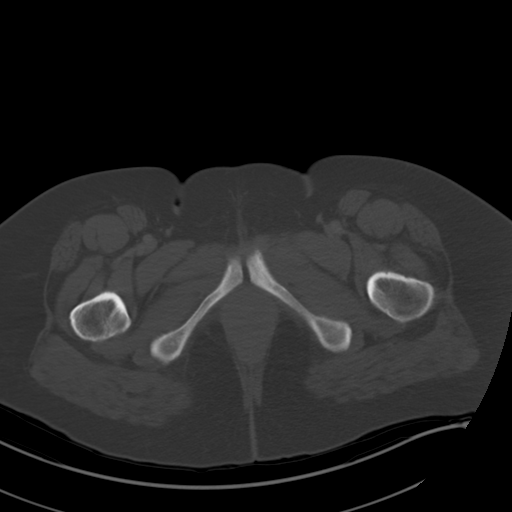
[im 13/109  soft-tissue]
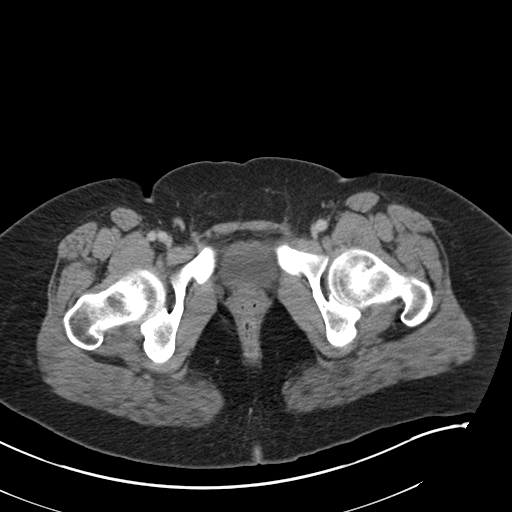
[im 25/109  soft-tissue]
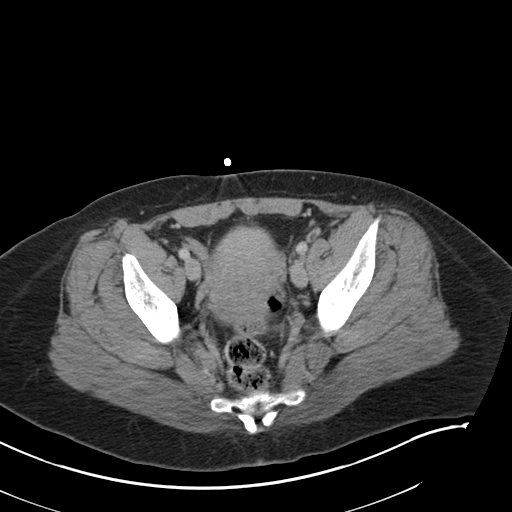
[im 31/109  soft-tissue]
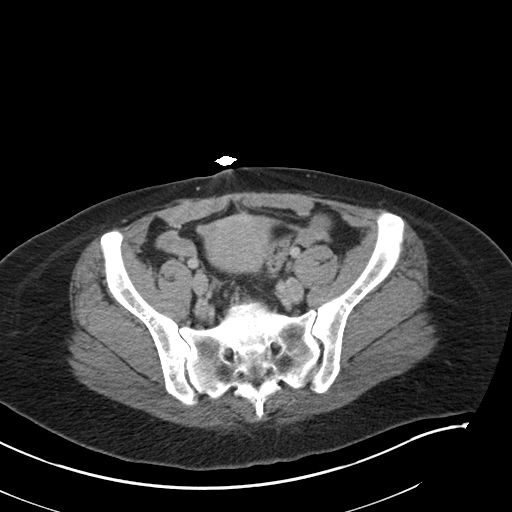
[im 37/109  soft-tissue]
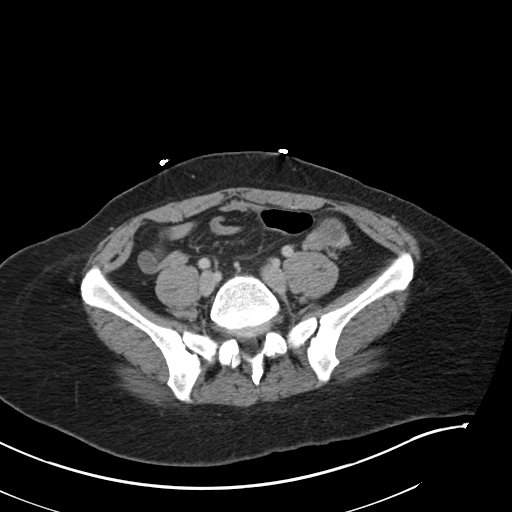
[im 49/109  soft-tissue]
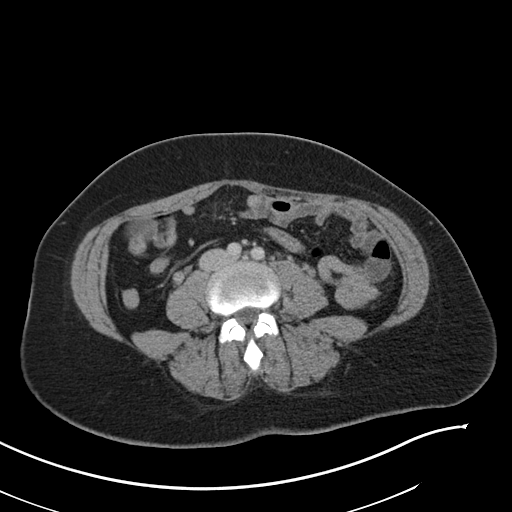
[im 55/109  soft-tissue]
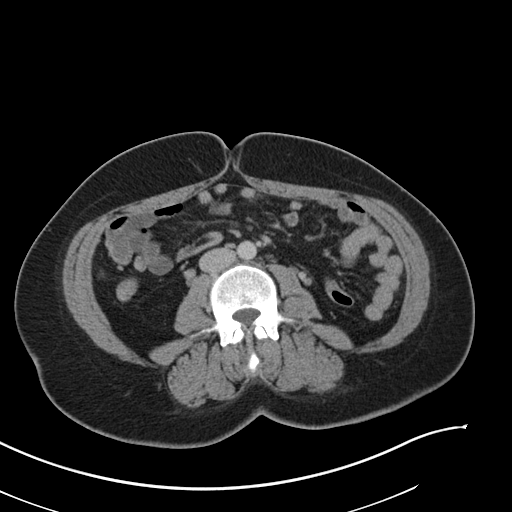
[im 61/109  soft-tissue]
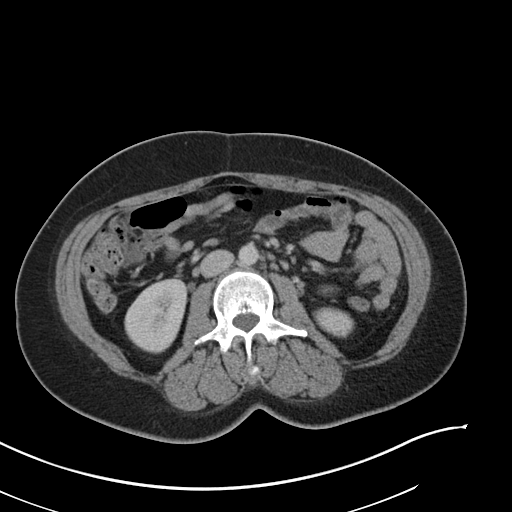
[im 73/109  soft-tissue]
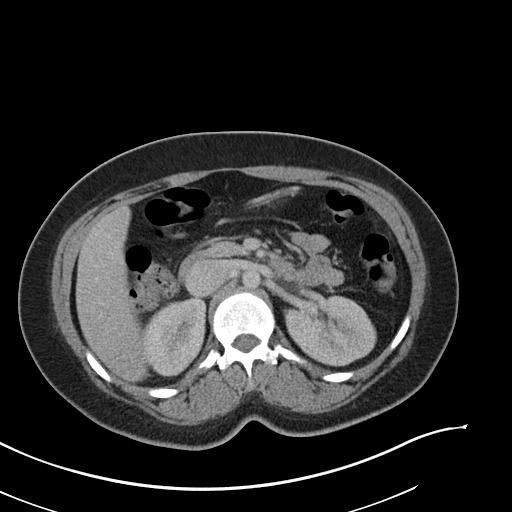
[im 73/109  bone]
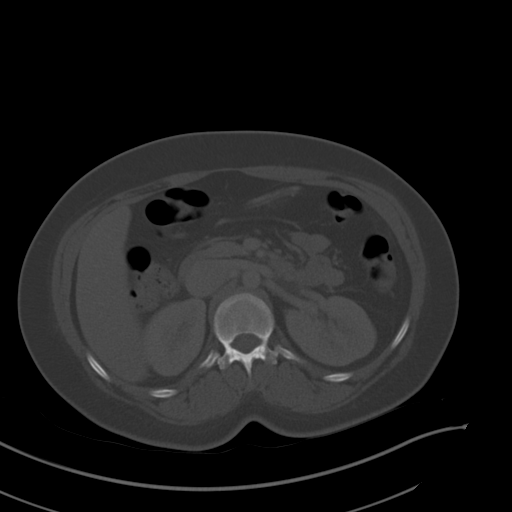
[im 79/109  soft-tissue]
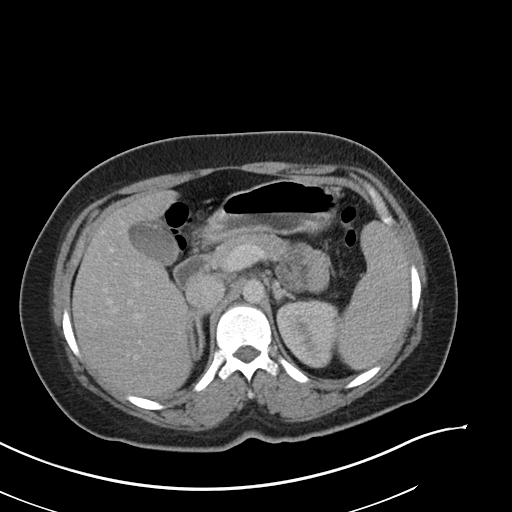
[im 85/109  soft-tissue]
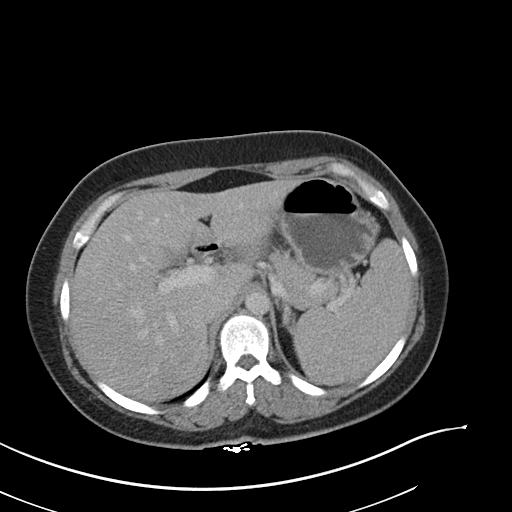
[im 97/109  soft-tissue]
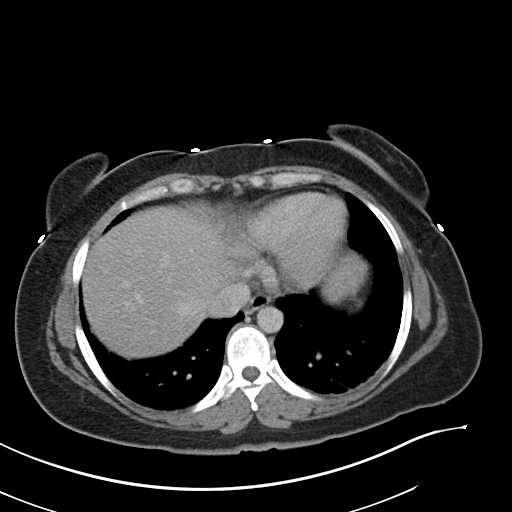
[im 103/109  soft-tissue]
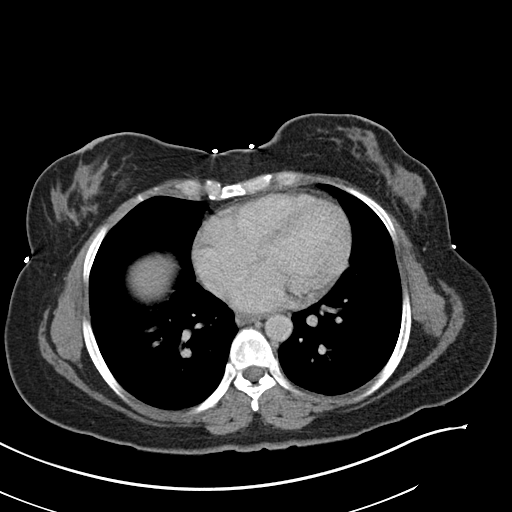

[Series 5: coronal st · coronal · 0.95mm/px · 3 of 122 slices shown]
[im 41/122  soft-tissue]
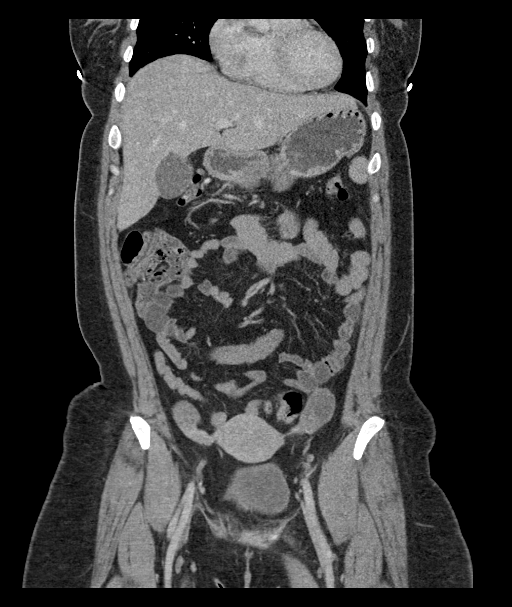
[im 54/122  soft-tissue]
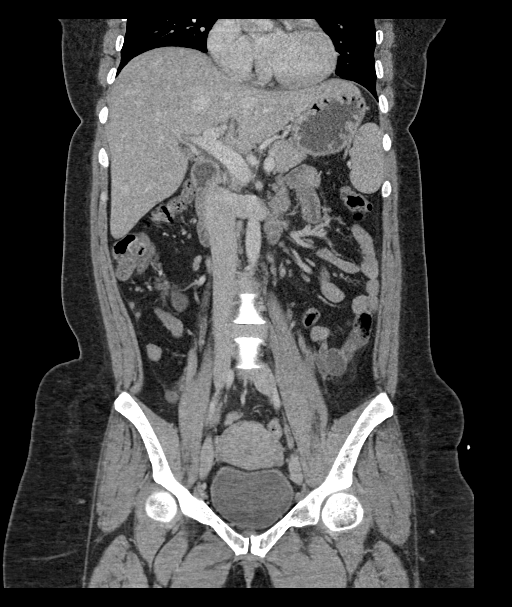
[im 68/122  soft-tissue]
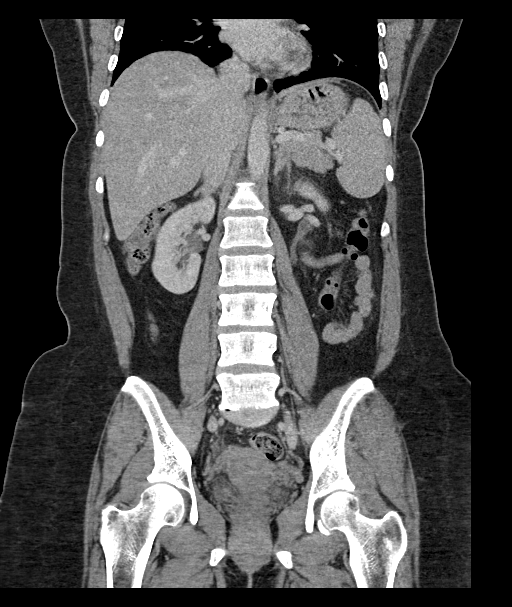

[16 of 46 positions shown; findings below may reference images not displayed]

FINDINGS: Lower chest: The visualized lung bases are clear.

No intra-abdominal free air or free fluid.

Hepatobiliary: Apparent mild fatty liver. No intrahepatic biliary
ductal dilatation. The gallbladder is unremarkable.

Pancreas: Unremarkable. No pancreatic ductal dilatation or
surrounding inflammatory changes.

Spleen: Normal in size without focal abnormality.

Adrenals/Urinary Tract: The adrenal glands are unremarkable. The
kidneys, visualized ureters, and the urinary bladder appear
unremarkable.

Stomach/Bowel: There is no bowel obstruction or active inflammation.
The appendix is normal.

Vascular/Lymphatic: The abdominal aorta and IVC are unremarkable. No
portal venous gas. There is no adenopathy.

Reproductive: The uterus is anteverted and grossly unremarkable.
Bilateral ovarian follicles/small cysts.

Other: None

Musculoskeletal: Bilateral L5 pars defects. No listhesis. No acute
osseous pathology.
IMPRESSION: No acute intra-abdominal or pelvic pathology. No bowel obstruction.
Normal appendix.

## 2022-09-16 ENCOUNTER — Emergency Department (HOSPITAL_COMMUNITY)
Admission: EM | Admit: 2022-09-16 | Discharge: 2022-09-16 | Disposition: A | Discharge status: Home / Self Care | Payer: 59 | Attending: Emergency Medicine | Admitting: Emergency Medicine

## 2022-09-16 ENCOUNTER — Encounter (HOSPITAL_COMMUNITY): Payer: Self-pay

## 2022-09-16 DIAGNOSIS — R21 Rash and other nonspecific skin eruption: Secondary | ICD-10-CM | POA: Insufficient documentation

## 2022-09-16 LAB — COMPREHENSIVE METABOLIC PANEL
ALT: 14 U/L (ref 0–44)
AST: 13 U/L — ABNORMAL LOW (ref 15–41)
Albumin: 4 g/dL (ref 3.5–5.0)
Alkaline Phosphatase: 54 U/L (ref 38–126)
Anion gap: 6 (ref 5–15)
BUN: 15 mg/dL (ref 6–20)
CO2: 23 mmol/L (ref 22–32)
Calcium: 9 mg/dL (ref 8.9–10.3)
Chloride: 108 mmol/L (ref 98–111)
Creatinine, Ser: 0.53 mg/dL (ref 0.44–1.00)
GFR, Estimated: >60 mL/min (ref >60)
Glucose, Bld: 97 mg/dL (ref 70–99)
Potassium: 3.8 mmol/L (ref 3.5–5.1)
Sodium: 137 mmol/L (ref 135–145)
Total Bilirubin: 0.4 mg/dL (ref 0.3–1.2)
Total Protein: 7.2 g/dL (ref 6.5–8.1)

## 2022-09-16 LAB — CBC WITH DIFFERENTIAL/PLATELET
Abs Immature Granulocytes: 0.02 10*3/uL (ref 0.00–0.07)
Basophils Absolute: 0.1 10*3/uL (ref 0.0–0.1)
Basophils Relative: 1 %
Eosinophils Absolute: 0.2 10*3/uL (ref 0.0–0.5)
Eosinophils Relative: 3 %
HCT: 34.7 % — ABNORMAL LOW (ref 36.0–46.0)
Hemoglobin: 11.2 g/dL — ABNORMAL LOW (ref 12.0–15.0)
Immature Granulocytes: 0 %
Lymphocytes Relative: 30 %
Lymphs Abs: 2.2 10*3/uL (ref 0.7–4.0)
MCH: 25.7 pg — ABNORMAL LOW (ref 26.0–34.0)
MCHC: 32.3 g/dL (ref 30.0–36.0)
MCV: 79.6 fL — ABNORMAL LOW (ref 80.0–100.0)
Monocytes Absolute: 0.4 10*3/uL (ref 0.1–1.0)
Monocytes Relative: 5 %
Neutro Abs: 4.5 10*3/uL (ref 1.7–7.7)
Neutrophils Relative %: 61 %
Platelets: 260 10*3/uL (ref 150–400)
RBC: 4.36 MIL/uL (ref 3.87–5.11)
RDW: 14.6 % (ref 11.5–15.5)
WBC: 7.4 10*3/uL (ref 4.0–10.5)
nRBC: 0 % (ref 0.0–0.2)

## 2022-09-16 MED ORDER — TERBINAFINE HCL 250 MG PO TABS
250.0000 mg | ORAL_TABLET | Freq: Every day | ORAL | Status: DC
  Administered 2022-09-16: 250 mg via ORAL
  Filled 2022-09-16: qty 1

## 2022-09-16 MED ORDER — TERBINAFINE HCL 250 MG PO TABS: 250.0000 mg | ORAL_TABLET | Freq: Every day | ORAL | 0 refills | Status: AC

## 2022-09-16 NOTE — ED Triage Notes (Signed)
Pt arrived via POV, c/o itchy rash that started behind bilateral knees, has spread to upper legs, and was on face as well. Facial rash now resolved, but other areas remain. States approx 2 wks ago it started.

## 2022-09-16 NOTE — ED Provider Notes (Signed)
[facility] DEPT Provider Note   CSN: [id number] Arrival date & time: 09/16/22  1003     History  Chief Complaint  Patient presents with   Rash    Deborah White is a 36 y.o. female.  HPI Patient presents with concern of recurrent rash.  Over the past few days she has noticed rash primarily in the popliteal region with some scattered areas on her torso and arms.  Rash is similar to that she experienced earlier this year, required multiple urgent care visits, medications, eventually resolving.  She has not seen an allergist or primary care physician since that time.  She did however, start oral contraceptives last week.  With past few days she has noticed itchy rash in the after mentioned areas.  No fever, chills, nausea, vomiting, chest pain.    Home Medications Prior to Admission medications   Medication Sig Start Date End Date Taking? Authorizing Provider  terbinafine (LAMISIL) 250 MG tablet Take 1 tablet (250 mg total) by mouth daily for 13 days. 09/16/22 09/29/22 Yes Carmin Muskrat, MD  acetaminophen (TYLENOL) 500 MG tablet Take 500 mg by mouth every 6 (six) hours as needed for moderate pain. Patient not taking: Reported on 08/09/2020    [provider]  b complex vitamins tablet Take 1 tablet by mouth daily.    [provider]  cetirizine (ZYRTEC) 10 MG tablet Take 1 tablet (10 mg total) by mouth daily. 03/31/21   Augusto Gamble B, NP  norethindrone-ethinyl estradiol 1/35 ([place] 1/35, 28,) tablet Take 1 tablet by mouth daily. 08/19/20   Shelly Bombard, MD  ondansetron (ZOFRAN ODT) 4 MG disintegrating tablet Take 1 tablet (4 mg total) by mouth every 8 (eight) hours as needed for nausea or vomiting. 06/04/21   Loni Beckwith, PA-C  pantoprazole (PROTONIX) 20 MG tablet Take 1 tablet (20 mg total) by mouth daily. 06/04/21   Loni Beckwith, PA-C  sertraline (ZOLOFT) 50 MG tablet Take 1 tablet (50 mg total) by mouth  daily. Patient not taking: Reported on 08/09/2020 05/27/20   Constant, Peggy, MD  sucralfate (CARAFATE) 1 g tablet Take 1 tablet (1 g total) by mouth 4 (four) times daily -  with meals and at bedtime. 06/04/21 07/04/21  Loni Beckwith, PA-C      Allergies    Patient has no known allergies.    Review of Systems   Review of Systems  Constitutional:  Positive for fatigue.  All other systems reviewed and are negative.   Physical Exam Updated Vital Signs BP 105/76 (BP Location: Left Arm)   Pulse 72   Temp 98.3 F (36.8 C) (Oral)   Resp 17   SpO2 100%  Physical Exam Vitals and nursing note reviewed.  Constitutional:      General: She is not in acute distress.    Appearance: She is well-developed. She is not ill-appearing or diaphoretic.  HENT:     Head: Normocephalic and atraumatic.  Eyes:     Conjunctiva/sclera: Conjunctivae normal.  Cardiovascular:     Rate and Rhythm: Normal rate and regular rhythm.  Pulmonary:     Effort: Pulmonary effort is normal. No respiratory distress.     Breath sounds: Normal breath sounds. No stridor.  Abdominal:     General: There is no distension.  Skin:    General: Skin is warm and dry.       Neurological:     Mental Status: She is alert and oriented to person,  place, and time.     Cranial Nerves: No cranial nerve deficit.  Psychiatric:        Mood and Affect: Mood normal.     ED Results / Procedures / Treatments   Labs (all labs ordered are listed, but only abnormal results are displayed) Labs Reviewed  COMPREHENSIVE METABOLIC PANEL - Abnormal; Notable for the following components:      Result Value   AST 13 (*)    All other components within normal limits  CBC WITH DIFFERENTIAL/PLATELET - Abnormal; Notable for the following components:   Hemoglobin 11.2 (*)    HCT 34.7 (*)    MCV 79.6 (*)    MCH 25.7 (*)    All other components within normal limits    EKG None  Radiology No results found.  Procedures Procedures     Medications Ordered in ED Medications  terbinafine (LAMISIL) tablet 250 mg (has no administration in time range)    ED Course/ Medical Decision Making/ A&P This patient with a Hx of prior similar rash presents with rash, fatigue, this involves an extensive number of treatment options, and is a complaint that carries with it a high risk of complications and morbidity.    The differential diagnosis includes Candida infection, urticaria, less likely bacteremia, sepsis, but with concern for fatigue, systemic manifestations are consideration.   Social Determinants of Health:  No limiting factors  Additional history obtained:  Additional history and/or information obtained from patient's phone w photos of prior rash.   After the initial evaluation, orders, including: labs were initiated.  On repeat evaluation of the patient stayed the same  Lab Tests:  I personally interpreted labs.  The pertinent results include: Unremarkable labs aside from mild anemia no electrolyte abnormalities Dispostion / Final MDM:  After consideration of the diagnostic results and the patient's response to treatment, patient is appropriate for discharge.  She is awake, alert, hemodynamically unremarkable has no evidence of cellulitis, bacteremia, sepsis.  No confluent erythema suggesting bacterial infection, some suspicion for fungal infection, tinea corporis.  Patient discharged with initiation of appropriate meds.  Final Clinical Impression(s) / ED Diagnoses Final diagnoses:  Rash    Rx / DC Orders ED Discharge Orders          Ordered    terbinafine (LAMISIL) 250 MG tablet  Daily        09/16/22 1347              Gerhard Munch, MD 09/16/22 1348

## 2022-09-16 NOTE — Discharge Instructions (Addendum)
As discussed, your rash is likely due to a fungal infection.  Please take all medication as prescribed and follow-up with both your primary care physician and our dermatology colleagues.  Return here for concerning changes in your condition.

## 2023-02-05 DIAGNOSIS — Z419 Encounter for procedure for purposes other than remedying health state, unspecified: Secondary | ICD-10-CM | POA: Diagnosis not present

## 2023-03-08 DIAGNOSIS — Z419 Encounter for procedure for purposes other than remedying health state, unspecified: Secondary | ICD-10-CM | POA: Diagnosis not present

## 2023-04-07 DIAGNOSIS — Z419 Encounter for procedure for purposes other than remedying health state, unspecified: Secondary | ICD-10-CM | POA: Diagnosis not present

## 2023-04-13 ENCOUNTER — Other Ambulatory Visit: Payer: Self-pay | Admitting: Family Medicine

## 2023-04-13 DIAGNOSIS — Z0000 Encounter for general adult medical examination without abnormal findings: Secondary | ICD-10-CM

## 2023-04-16 ENCOUNTER — Encounter: Payer: Self-pay | Admitting: Family Medicine

## 2023-05-08 DIAGNOSIS — Z419 Encounter for procedure for purposes other than remedying health state, unspecified: Secondary | ICD-10-CM | POA: Diagnosis not present

## 2023-06-07 DIAGNOSIS — Z419 Encounter for procedure for purposes other than remedying health state, unspecified: Secondary | ICD-10-CM | POA: Diagnosis not present

## 2023-06-14 ENCOUNTER — Encounter (HOSPITAL_COMMUNITY): Payer: Self-pay

## 2023-06-14 ENCOUNTER — Ambulatory Visit (HOSPITAL_COMMUNITY)
Admission: EM | Admit: 2023-06-14 | Discharge: 2023-06-14 | Disposition: A | Discharge status: Home / Self Care | Payer: BC Managed Care – PPO

## 2023-06-14 DIAGNOSIS — J0100 Acute maxillary sinusitis, unspecified: Secondary | ICD-10-CM | POA: Diagnosis not present

## 2023-06-14 MED ORDER — AMOXICILLIN-POT CLAVULANATE 875-125 MG PO TABS: 1.0000 | ORAL_TABLET | Freq: Two times a day (BID) | ORAL | 0 refills | Status: AC

## 2023-06-14 MED ORDER — FLUTICASONE PROPIONATE 50 MCG/ACT NA SUSP: 1.0000 | Freq: Every day | NASAL | 0 refills | Status: DC

## 2023-06-14 MED ORDER — CETIRIZINE HCL 10 MG PO TABS: 10.0000 mg | ORAL_TABLET | Freq: Every day | ORAL | 0 refills | Status: DC

## 2023-06-14 NOTE — Discharge Instructions (Signed)
Take Augmentin twice daily for 7 days to treat for sinus infection.  Start taking an oral antihistamine (like Zyrtec) and Flonase (nasal spray) to help with allergy symptoms during season changes.   Increase hydration with plenty of water or sugar free electrolyte solutions like Pedialyte.  You can also take Mucinex 600 mg twice daily to help with nasal congestion and thin out secretions.

## 2023-06-14 NOTE — ED Triage Notes (Signed)
Patient here today with c/o nasal congestion, fatigue, and scratchy throat X 2 weeks. Her kids has also had a stuffy nose. She thinks that this is different.

## 2023-06-14 NOTE — ED Provider Notes (Signed)
[facility]    CSN: [id number] Arrival date & time: 06/14/23  1012      History   Chief Complaint Chief Complaint  Patient presents with   Nasal Congestion    HPI Deborah White is a 37 y.o. female.   Patient presents today with more than 1 week history of yellow/green nasal congestion, hoarseness, runny nose, sneezing, post nasal drainage, scratchy throat, sinus pressure in her cheeks, and fatigue.  She denies fever, body aches or chills, cough, chest pain or shortness of breath, headache, ear pain, abdominal pain, nausea/vomiting, diarrhea, and loss of appetite.  She son and daughter were sick with similar symptoms initially however their symptoms have since improved.  Has not taken anything for these symptoms so far.  Also is concerned about an itchy rash on the back of her knees that has been present for the past couple of months.  She denies redness, oozing, or drainage from the rash.  No new soaps, detergents, or personal care products.  She does have a history of eczema and was trying eczema cream initially but has not been using it consistently.     Past Medical History:  Diagnosis Date   Anemia    GBS (group B Streptococcus carrier), +RV culture, currently pregnant 02/05/2020   Thyroid disease     Patient Active Problem List   Diagnosis Date Noted   Insertion of Implanon 05/09/2020   Postpartum care following cesarean delivery 04/23/2020   Status post repeat low transverse cesarean section 09/18/2019   Family history of breast cancer in mother 01/31/2016   Breast lump in female 11/29/2013    Past Surgical History:  Procedure Laterality Date   BACK SURGERY  2010   cyst removal   CESAREAN SECTION N/A 06/11/2018   Procedure: CESAREAN SECTION;  Surgeon: Reva Bores, MD;  Location: [facility];  Service: Obstetrics;  Laterality: N/A;   CESAREAN SECTION N/A 03/07/2020   Procedure: CESAREAN SECTION;  Surgeon: Malachy Chamber, MD;  Location: [facility];  Service: Obstetrics;  Laterality: N/A;   WRIST SURGERY Left 2011   Cyst    OB History     Gravida  2   Para  2   Term  2   Preterm      AB      Living  2      SAB      IAB      Ectopic      Multiple  0   Live Births  2            Home Medications    Prior to Admission medications   Medication Sig Start Date End Date Taking? Authorizing Provider  amoxicillin-clavulanate (AUGMENTIN) 875-125 MG tablet Take 1 tablet by mouth 2 (two) times daily for 7 days. 06/14/23 06/21/23 Yes Valentino Nose, NP  b complex vitamins tablet Take 1 tablet by mouth daily.   Yes [provider]  fluticasone (FLONASE) 50 MCG/ACT nasal spray Place 1 spray into both nostrils daily. 06/14/23  Yes Valentino Nose, NP  Iron Combinations (CHROMAGEN) capsule Take 1 capsule by mouth daily.   Yes [provider]  Vitamin D, Ergocalciferol, (DRISDOL) 1.25 MG (50000 UNIT) CAPS capsule Take 50,000 Units by mouth every 7 (seven) days.   Yes [provider]  acetaminophen (TYLENOL) 500 MG tablet Take 500 mg by mouth every 6 (six) hours as needed for moderate pain. Patient not taking: Reported on 08/09/2020  [provider]  cetirizine (ZYRTEC) 10 MG tablet Take 1 tablet (10 mg total) by mouth daily. 06/14/23   Valentino Nose, NP  norethindrone-ethinyl estradiol 1/35 (ORTHO-NOVUM 1/35, 28,) tablet Take 1 tablet by mouth daily. 08/19/20   Brock Bad, MD  ondansetron (ZOFRAN ODT) 4 MG disintegrating tablet Take 1 tablet (4 mg total) by mouth every 8 (eight) hours as needed for nausea or vomiting. 06/04/21   Haskel Schroeder, PA-C  pantoprazole (PROTONIX) 20 MG tablet Take 1 tablet (20 mg total) by mouth daily. 06/04/21   Haskel Schroeder, PA-C  sertraline (ZOLOFT) 50 MG tablet Take 1 tablet (50 mg total) by mouth daily. Patient not taking: Reported on 08/09/2020 05/27/20   Constant, Peggy, MD  sucralfate (CARAFATE) 1 g tablet Take 1 tablet (1 g  total) by mouth 4 (four) times daily -  with meals and at bedtime. 06/04/21 07/04/21  Haskel Schroeder, PA-C    Family History Family History  Problem Relation Age of Onset   Breast cancer Mother    Hypertension Mother    Cancer Maternal Uncle        brain cancer    Hypertension Maternal Grandmother     Social History Social History   Tobacco Use   Smoking status: Former    Types: Cigarettes    Quit date: 06/23/2016    Years since quitting: 6.9   Smokeless tobacco: Never  Vaping Use   Vaping Use: Never used  Substance Use Topics   Alcohol use: Yes    Comment: occasional   Drug use: No     Allergies   Patient has no known allergies.   Review of Systems Review of Systems Per HPI  Physical Exam Triage Vital Signs ED Triage Vitals  Enc Vitals Group     BP 06/14/23 1107 121/66     Pulse Rate 06/14/23 1107 68     Resp 06/14/23 1107 16     Temp 06/14/23 1107 98.4 F (36.9 C)     Temp Source 06/14/23 1107 Oral     SpO2 06/14/23 1107 98 %     Weight 06/14/23 1106 181 lb (82.1 kg)     Height 06/14/23 1106 5\' 6"  (1.676 m)     Head Circumference --      Peak Flow --      Pain Score 06/14/23 1106 4     Pain Loc --      Pain Edu? --      Excl. in GC? --    No data found.  Updated Vital Signs BP 121/66 (BP Location: Left Arm)   Pulse 68   Temp 98.4 F (36.9 C) (Oral)   Resp 16   Ht 5\' 6"  (1.676 m)   Wt 181 lb (82.1 kg)   LMP 05/21/2023 (Exact Date)   SpO2 98%   BMI 29.21 kg/m   Visual Acuity Right Eye Distance:   Left Eye Distance:   Bilateral Distance:    Right Eye Near:   Left Eye Near:    Bilateral Near:     Physical Exam Vitals and nursing note reviewed.  Constitutional:      General: She is not in acute distress.    Appearance: Normal appearance. She is not ill-appearing or toxic-appearing.  HENT:     Head: Normocephalic and atraumatic.     Right Ear: Tympanic membrane, ear canal and external ear normal.     Left Ear: Tympanic  membrane, ear canal and external  ear normal.     Nose: Congestion and rhinorrhea present.     Right Sinus: Maxillary sinus tenderness present. No frontal sinus tenderness.     Left Sinus: Maxillary sinus tenderness present. No frontal sinus tenderness.     Mouth/Throat:     Mouth: Mucous membranes are moist.     Pharynx: Oropharynx is clear. No oropharyngeal exudate or posterior oropharyngeal erythema.  Eyes:     General: No scleral icterus.    Extraocular Movements: Extraocular movements intact.  Cardiovascular:     Rate and Rhythm: Normal rate and regular rhythm.  Pulmonary:     Effort: Pulmonary effort is normal. No respiratory distress.     Breath sounds: Normal breath sounds. No wheezing, rhonchi or rales.  Abdominal:     General: Abdomen is flat. Bowel sounds are normal. There is no distension.     Palpations: Abdomen is soft.  Musculoskeletal:     Cervical back: Normal range of motion and neck supple.  Lymphadenopathy:     Cervical: No cervical adenopathy.  Skin:    General: Skin is warm and dry.     Capillary Refill: Capillary refill takes less than 2 seconds.     Coloration: Skin is not jaundiced or pale.     Findings: No erythema or rash.  Neurological:     Mental Status: She is alert and oriented to person, place, and time.     Motor: No weakness.  Psychiatric:        Behavior: Behavior is cooperative.      UC Treatments / Results  Labs (all labs ordered are listed, but only abnormal results are displayed) Labs Reviewed - No data to display  EKG   Radiology No results found.  Procedures Procedures (including critical care time)  Medications Ordered in UC Medications - No data to display  Initial Impression / Assessment and Plan / UC Course  I have reviewed the triage vital signs and the nursing notes.  Pertinent labs & imaging results that were available during my care of the patient were reviewed by me and considered in my medical decision making  (see chart for details).   Patient is well-appearing, normotensive, afebrile, not tachycardic, not tachypneic, oxygenating well on room air.    1. Acute non-recurrent maxillary sinusitis Treat with Augmentin twice a day for 7 days Start oral antihistamine and Flonase nasal spray to help with allergy regimen Other supportive care discussed ER and return precautions also discussed Note given for work  The patient was given the opportunity to ask questions.  All questions answered to their satisfaction.  The patient is in agreement to this plan.    Final Clinical Impressions(s) / UC Diagnoses   Final diagnoses:  Acute non-recurrent maxillary sinusitis     Discharge Instructions      Take Augmentin twice daily for 7 days to treat for sinus infection.  Start taking an oral antihistamine (like Zyrtec) and Flonase (nasal spray) to help with allergy symptoms during season changes.   Increase hydration with plenty of water or sugar free electrolyte solutions like Pedialyte.  You can also take Mucinex 600 mg twice daily to help with nasal congestion and thin out secretions.     ED Prescriptions     Medication Sig Dispense Auth. Provider   cetirizine (ZYRTEC) 10 MG tablet Take 1 tablet (10 mg total) by mouth daily. 30 tablet Cathlean Marseilles A, NP   fluticasone (FLONASE) 50 MCG/ACT nasal spray Place 1 spray into both nostrils  daily. 16 g Cathlean Marseilles A, NP   amoxicillin-clavulanate (AUGMENTIN) 875-125 MG tablet Take 1 tablet by mouth 2 (two) times daily for 7 days. 14 tablet Valentino Nose, NP      PDMP not reviewed this encounter.   Valentino Nose, NP 06/14/23 [phone]

## 2023-07-08 DIAGNOSIS — Z419 Encounter for procedure for purposes other than remedying health state, unspecified: Secondary | ICD-10-CM | POA: Diagnosis not present

## 2023-07-17 ENCOUNTER — Other Ambulatory Visit: Payer: Self-pay | Admitting: Nurse Practitioner

## 2023-07-19 NOTE — Telephone Encounter (Signed)
Requested Prescriptions  Refused Prescriptions Disp Refills   cetirizine (ZYRTEC) 10 MG tablet [Pharmacy Med Name: CETIRIZINE HCL 10 MG TABLET] 30 tablet 0    Sig: TAKE 1 TABLET BY MOUTH EVERY DAY     Ear, Nose, and Throat:  Antihistamines 2 Failed - 07/17/2023 11:21 AM      Failed - Valid encounter within last 12 months    Recent Outpatient Visits           5 years ago Acute upper respiratory infection   [facility], [place] D, PA-C   5 years ago Amenorrhea   [facility], [place] July, MD   5 years ago Annual physical exam   [facility], [place]ell July, MD   5 years ago Allergic contact dermatitis, unspecified trigger   [facility], Levell July, MD   6 years ago Menorrhagia with regular cycle   [facility], Levell July, MD              Passed - Cr in normal range and within 360 days    Creat  Date Value Ref Range Status  08/21/2016 0.75 0.50 - 1.10 mg/dL Final   Creatinine, Ser  Date Value Ref Range Status  09/16/2022 0.53 0.44 - 1.00 mg/dL Final          fluticasone (FLONASE) 50 MCG/ACT nasal spray [Pharmacy Med Name: FLUTICASONE PROP 50 MCG SPRAY] 16 mL     Sig: SPRAY 1 SPRAY INTO BOTH NOSTRILS DAILY.     Ear, Nose, and Throat: Nasal Preparations - Corticosteroids Failed - 07/17/2023 11:21 AM      Failed - Valid encounter within last 12 months    Recent Outpatient Visits           5 years ago Acute upper respiratory infection   [facility], [place] D, PA-C   5 years ago Amenorrhea   [facility], Levell July, MD   5 years ago Annual physical exam   [facility], Levell July, MD   5 years ago Allergic contact dermatitis, unspecified trigger   [facility], Levell July, MD   6 years ago Menorrhagia with regular cycle   [facility], [place] July, MD

## 2023-08-03 ENCOUNTER — Encounter (HOSPITAL_COMMUNITY): Payer: Self-pay

## 2023-08-03 ENCOUNTER — Ambulatory Visit (HOSPITAL_COMMUNITY)
Admission: EM | Admit: 2023-08-03 | Discharge: 2023-08-03 | Disposition: A | Discharge status: Home / Self Care | Payer: BC Managed Care – PPO | Attending: Physician Assistant | Admitting: Physician Assistant

## 2023-08-03 DIAGNOSIS — N644 Mastodynia: Secondary | ICD-10-CM | POA: Insufficient documentation

## 2023-08-03 DIAGNOSIS — Z87891 Personal history of nicotine dependence: Secondary | ICD-10-CM | POA: Insufficient documentation

## 2023-08-03 DIAGNOSIS — R200 Anesthesia of skin: Secondary | ICD-10-CM | POA: Diagnosis not present

## 2023-08-03 DIAGNOSIS — Z86018 Personal history of other benign neoplasm: Secondary | ICD-10-CM | POA: Insufficient documentation

## 2023-08-03 DIAGNOSIS — Z803 Family history of malignant neoplasm of breast: Secondary | ICD-10-CM | POA: Insufficient documentation

## 2023-08-03 DIAGNOSIS — N6321 Unspecified lump in the left breast, upper outer quadrant: Secondary | ICD-10-CM | POA: Insufficient documentation

## 2023-08-03 LAB — COMPREHENSIVE METABOLIC PANEL
ALT: 14 U/L (ref 0–44)
AST: 16 U/L (ref 15–41)
Albumin: 4.1 g/dL (ref 3.5–5.0)
Alkaline Phosphatase: 57 U/L (ref 38–126)
Anion gap: 7 (ref 5–15)
BUN: 12 mg/dL (ref 6–20)
CO2: 25 mmol/L (ref 22–32)
Calcium: 9 mg/dL (ref 8.9–10.3)
Chloride: 104 mmol/L (ref 98–111)
Creatinine, Ser: 0.67 mg/dL (ref 0.44–1.00)
GFR, Estimated: >60 mL/min (ref >60)
Glucose, Bld: 76 mg/dL (ref 70–99)
Potassium: 4.1 mmol/L (ref 3.5–5.1)
Sodium: 136 mmol/L (ref 135–145)
Total Bilirubin: 0.7 mg/dL (ref 0.3–1.2)
Total Protein: 7.1 g/dL (ref 6.5–8.1)

## 2023-08-03 LAB — CBC WITH DIFFERENTIAL/PLATELET
Abs Immature Granulocytes: 0.02 10*3/uL (ref 0.00–0.07)
Basophils Absolute: 0 10*3/uL (ref 0.0–0.1)
Basophils Relative: 1 %
Eosinophils Absolute: 0.1 10*3/uL (ref 0.0–0.5)
Eosinophils Relative: 1 %
HCT: 38.8 % (ref 36.0–46.0)
Hemoglobin: 12.3 g/dL (ref 12.0–15.0)
Immature Granulocytes: 0 %
Lymphocytes Relative: 25 %
Lymphs Abs: 1.7 10*3/uL (ref 0.7–4.0)
MCH: 25.2 pg — ABNORMAL LOW (ref 26.0–34.0)
MCHC: 31.7 g/dL (ref 30.0–36.0)
MCV: 79.5 fL — ABNORMAL LOW (ref 80.0–100.0)
Monocytes Absolute: 0.6 10*3/uL (ref 0.1–1.0)
Monocytes Relative: 8 %
Neutro Abs: 4.4 10*3/uL (ref 1.7–7.7)
Neutrophils Relative %: 65 %
Platelets: 253 10*3/uL (ref 150–400)
RBC: 4.88 MIL/uL (ref 3.87–5.11)
RDW: 14.2 % (ref 11.5–15.5)
WBC: 6.9 10*3/uL (ref 4.0–10.5)
nRBC: 0 % (ref 0.0–0.2)

## 2023-08-03 LAB — HEMOGLOBIN A1C
Hgb A1c MFr Bld: 5.1 % (ref 4.8–5.6)
Mean Plasma Glucose: 99.67 mg/dL

## 2023-08-03 LAB — VITAMIN B12: Vitamin B-12: 341 pg/mL (ref 180–914)

## 2023-08-03 NOTE — Discharge Instructions (Addendum)
Someone should contact you to schedule your mammogram and ultrasound.  If you do not hear from them within a week please contact us.  If anything changes and you have changing skin, fever, pain, redness, discharge from your nipple you should be seen immediately.  I will contact you if any of your blood work is abnormal.  Avoid any strenuous activity or repetitive movement of your wrist/finger.  Someone should contact you to schedule an appoint with primary care and if you have any trouble establishing with them please let us know.  If anything changes or worsens and you have additional areas of numbness or tingling, weakness you should be seen immediately.

## 2023-08-03 NOTE — ED Provider Notes (Signed)
[facility]    CSN: [id number] Arrival date & time: 08/03/23  6578      History   Chief Complaint Chief Complaint  Patient presents with   Breast Problem    HPI Deborah White is a 37 y.o. female.   Patient presents today with several concerns.  She has been lost to follow-up with primary care for several years.  Her primary concern today is reestablishing with the breast center for mammogram.  She has a strong family history of breast cancer in early 71s and both her mother and her sister.  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  She reports associated bilateral breast pain.  Denies any abnormal nipple discharge.  She is not currently breast-feeding.  Denies any skin changes, erythema, fever, nausea, vomiting.  She previously did go to see a new primary care provider earlier this year but they placed the wrong order and she was unable to get her mammogram.  Her last mammogram was completed in 2018 after which she had negative biopsy did not have additional follow-up.  In addition, she reports that she has developed numbness in her right fifth finger.  She denies any injury or trauma.  She denies any significant past medical history including diabetes, previous chemotherapy, medication changes, history of neurological condition.  She is right-handed.  She reports that the numbness does not spread and is present at all times without identifiable trigger.  She denies any associated pain or weakness.  She denies any additional regions of neuropathy.     Past Medical History:  Diagnosis Date   Anemia    GBS (group B Streptococcus carrier), +RV culture, currently pregnant 02/05/2020   Thyroid disease     Patient Active Problem List   Diagnosis Date Noted   Insertion of Implanon 05/09/2020   Postpartum care following cesarean delivery 04/23/2020   Status post repeat low  transverse cesarean section 09/18/2019   Family history of breast cancer in mother 01/31/2016   Breast lump in female 11/29/2013    Past Surgical History:  Procedure Laterality Date   BACK SURGERY  2010   cyst removal   CESAREAN SECTION N/A 06/11/2018   Procedure: CESAREAN SECTION;  Surgeon: Reva Bores, MD;  Location: [facility]TH[facility];  Service: Obstetrics;  Laterality: N/A;   CESAREAN SECTION N/A 03/07/2020   Procedure: CESAREAN SECTION;  Surgeon: Malachy Chamber, MD;  Location: [facility];  Service: Obstetrics;  Laterality: N/A;   WRIST SURGERY Left 2011   Cyst    OB History     Gravida  2   Para  2   Term  2   Preterm      AB      Living  2      SAB      IAB      Ectopic      Multiple  0   Live Births  2            Home Medications    Prior to Admission medications   Medication Sig Start Date End Date Taking? Authorizing Provider  acetaminophen (TYLENOL) 500 MG tablet Take 500 mg by mouth every 6 (six) hours as needed for moderate pain. Patient not taking: Reported on 08/09/2020    [provider]  b complex vitamins tablet Take 1 tablet by mouth daily.  [provider]  cetirizine (ZYRTEC) 10 MG tablet Take 1 tablet (10 mg total) by mouth daily. 06/14/23   Valentino Nose, NP  fluticasone (FLONASE) 50 MCG/ACT nasal spray Place 1 spray into both nostrils daily. 06/14/23   Valentino Nose, NP  Iron Combinations (CHROMAGEN) capsule Take 1 capsule by mouth daily.    [provider]  norethindrone-ethinyl estradiol 1/35 (ORTHO-NOVUM 1/35, 28,) tablet Take 1 tablet by mouth daily. 08/19/20   Brock Bad, MD  ondansetron (ZOFRAN ODT) 4 MG disintegrating tablet Take 1 tablet (4 mg total) by mouth every 8 (eight) hours as needed for nausea or vomiting. 06/04/21   Haskel Schroeder, PA-C  pantoprazole (PROTONIX) 20 MG tablet Take 1 tablet (20 mg total) by mouth daily. 06/04/21   Haskel Schroeder, PA-C  sertraline  (ZOLOFT) 50 MG tablet Take 1 tablet (50 mg total) by mouth daily. Patient not taking: Reported on 08/09/2020 05/27/20   Constant, Peggy, MD  sucralfate (CARAFATE) 1 g tablet Take 1 tablet (1 g total) by mouth 4 (four) times daily -  with meals and at bedtime. 06/04/21 07/04/21  Haskel Schroeder, PA-C  Vitamin D, Ergocalciferol, (DRISDOL) 1.25 MG (50000 UNIT) CAPS capsule Take 50,000 Units by mouth every 7 (seven) days.    [provider]    Family History Family History  Problem Relation Age of Onset   Breast cancer Mother    Hypertension Mother    Cancer Maternal Uncle        brain cancer    Hypertension Maternal Grandmother     Social History Social History   Tobacco Use   Smoking status: Former    Current packs/day: 0.00    Types: Cigarettes    Quit date: 06/23/2016    Years since quitting: 7.1   Smokeless tobacco: Never  Vaping Use   Vaping status: Never Used  Substance Use Topics   Alcohol use: Yes    Comment: occasional   Drug use: No     Allergies   Patient has no known allergies.   Review of Systems Review of Systems  Constitutional:  Positive for activity change. Negative for appetite change, fatigue and fever.  Respiratory:  Negative for shortness of breath.   Cardiovascular:  Negative for chest pain.  Gastrointestinal:  Negative for abdominal pain, diarrhea, nausea and vomiting.  Neurological:  Positive for numbness (right pinkie finger). Negative for dizziness, weakness, light-headedness and headaches.     Physical Exam Triage Vital Signs ED Triage Vitals  Encounter Vitals Group     BP 08/03/23 0841 115/74     Systolic BP Percentile --      Diastolic BP Percentile --      Pulse Rate 08/03/23 0840 79     Resp 08/03/23 0840 17     Temp --      Temp Source 08/03/23 0840 Oral     SpO2 08/03/23 0840 100 %     Weight --      Height --      Head Circumference --      Peak Flow --      Pain Score 08/03/23 0839 0     Pain Loc --      Pain  Education --      Exclude from Growth Chart --    No data found.  Updated Vital Signs BP 115/74   Pulse 79   Resp 17   LMP 07/22/2023 (Exact Date)   SpO2 100%  Visual Acuity Right Eye Distance:   Left Eye Distance:   Bilateral Distance:    Right Eye Near:   Left Eye Near:    Bilateral Near:     Physical Exam Vitals reviewed.  Constitutional:      General: She is awake. She is not in acute distress.    Appearance: Normal appearance. She is well-developed. She is not ill-appearing.     Comments: Very pleasant female appears stated age in no acute distress sitting comfortably in exam room  HENT:     Head: Normocephalic and atraumatic.  Cardiovascular:     Rate and Rhythm: Normal rate and regular rhythm.     Heart sounds: Normal heart sounds, S1 normal and S2 normal. No murmur heard. Pulmonary:     Effort: Pulmonary effort is normal.     Breath sounds: Normal breath sounds. No wheezing, rhonchi or rales.     Comments: Clear to auscultation bilaterally Chest:  Breasts:    Right: Tenderness present. No swelling, inverted nipple, mass, nipple discharge or skin change.     Left: Mass, skin change and tenderness present. No swelling, inverted nipple or nipple discharge.    Abdominal:     Palpations: Abdomen is soft.     Tenderness: There is no abdominal tenderness.     Comments: Benign abdominal exam  Musculoskeletal:     Right hand: No swelling. Normal range of motion. Normal sensation. There is no disruption of two-point discrimination. Normal capillary refill.     Comments: Right hand: Normal pincer grip strength.  Normal to point discrimination.  Hand is neurovascularly intact.  Normal active range of motion of phalanges and at wrist.  Lymphadenopathy:     Upper Body:     Right upper body: No supraclavicular or axillary adenopathy.     Left upper body: No supraclavicular or axillary adenopathy.  Psychiatric:        Behavior: Behavior is cooperative.      UC  Treatments / Results  Labs (all labs ordered are listed, but only abnormal results are displayed) Labs Reviewed  COMPREHENSIVE METABOLIC PANEL  CBC WITH DIFFERENTIAL/PLATELET  VITAMIN B12  HEMOGLOBIN A1C    EKG   Radiology No results found.  Procedures Procedures (including critical care time)  Medications Ordered in UC Medications - No data to display  Initial Impression / Assessment and Plan / UC Course  I have reviewed the triage vital signs and the nursing notes.  Pertinent labs & imaging results that were available during my care of the patient were reviewed by me and considered in my medical decision making (see chart for details).     Patient is well-appearing, nontoxic, nontachycardic.  Diagnostic bilateral mammogram and ultrasound of left breast were ordered and are pending.  We discussed that if she is unable to imaging within a few weeks or does not hear from them she should contact us.  Discussed the importance of follow-up particular given her strong family history.  If she develops any worsening symptoms including additional lesions, fever, pain, nipple inversion, abnormal nipple discharge she needs to be seen immediately.  Hand is neurovascularly intact with no indication for emergent evaluation or imaging.  She is concerned that symptoms are related to a vitamin deficiency as she does have a history of vitamin deficiency in the past.  Basic blood work to investigate neuropathy including CBC, CMP, A1c, B12 were obtained and are pending.  We will contact her if these are abnormal.  She was encouraged  to avoid repetitive movement and strenuous activity.  Recommended follow-up with primary care to consider referral to neurology if symptoms persist.  She does not currently have a primary care she will establish care with someone for PCP assistance.  Discussed that if she has any worsening symptoms including spread of numbness/paresthesias, weakness in her hand, fever,  nausea, vomiting she needs to be seen immediately.  Strict return precautions given.  Final Clinical Impressions(s) / UC Diagnoses   Final diagnoses:  Mass of upper outer quadrant of left breast  Breast pain  Numbness of finger     Discharge Instructions      Someone should contact you to schedule your mammogram and ultrasound.  If you do not hear from them within a week please contact us.  If anything changes and you have changing skin, fever, pain, redness, discharge from your nipple you should be seen immediately.  I will contact you if any of your blood work is abnormal.  Avoid any strenuous activity or repetitive movement of your wrist/finger.  Someone should contact you to schedule an appoint with primary care and if you have any trouble establishing with them please let us know.  If anything changes or worsens and you have additional areas of numbness or tingling, weakness you should be seen immediately.     ED Prescriptions   None    PDMP not reviewed this encounter.   Jeani Hawking, PA-C 08/03/23 [id number]

## 2023-08-03 NOTE — ED Triage Notes (Signed)
Pt presents with c/o a lump to the lt breast for years. Pt states she feels she is low on vitamins.

## 2023-08-06 ENCOUNTER — Encounter (HOSPITAL_COMMUNITY): Payer: Self-pay

## 2023-08-08 DIAGNOSIS — Z419 Encounter for procedure for purposes other than remedying health state, unspecified: Secondary | ICD-10-CM | POA: Diagnosis not present

## 2023-08-12 ENCOUNTER — Ambulatory Visit
Admission: RE | Admit: 2023-08-12 | Discharge: 2023-08-12 | Disposition: A | Discharge status: Home / Self Care | Payer: BC Managed Care – PPO | Source: Ambulatory Visit | Attending: Physician Assistant | Admitting: Physician Assistant

## 2023-08-12 ENCOUNTER — Other Ambulatory Visit (HOSPITAL_COMMUNITY): Payer: Self-pay | Admitting: Physician Assistant

## 2023-08-12 DIAGNOSIS — N6310 Unspecified lump in the right breast, unspecified quadrant: Secondary | ICD-10-CM

## 2023-08-12 DIAGNOSIS — N644 Mastodynia: Secondary | ICD-10-CM

## 2023-08-12 DIAGNOSIS — N6321 Unspecified lump in the left breast, upper outer quadrant: Secondary | ICD-10-CM

## 2023-08-12 DIAGNOSIS — N6320 Unspecified lump in the left breast, unspecified quadrant: Secondary | ICD-10-CM

## 2023-09-07 DIAGNOSIS — Z419 Encounter for procedure for purposes other than remedying health state, unspecified: Secondary | ICD-10-CM | POA: Diagnosis not present

## 2023-10-08 DIAGNOSIS — Z419 Encounter for procedure for purposes other than remedying health state, unspecified: Secondary | ICD-10-CM | POA: Diagnosis not present

## 2023-10-12 ENCOUNTER — Ambulatory Visit (HOSPITAL_COMMUNITY)
Admission: EM | Admit: 2023-10-12 | Discharge: 2023-10-12 | Disposition: A | Discharge status: Home / Self Care | Payer: BC Managed Care – PPO | Attending: Family Medicine | Admitting: Family Medicine

## 2023-10-12 ENCOUNTER — Encounter (HOSPITAL_COMMUNITY): Payer: Self-pay

## 2023-10-12 DIAGNOSIS — J069 Acute upper respiratory infection, unspecified: Secondary | ICD-10-CM | POA: Diagnosis not present

## 2023-10-12 DIAGNOSIS — H66002 Acute suppurative otitis media without spontaneous rupture of ear drum, left ear: Secondary | ICD-10-CM

## 2023-10-12 LAB — POC COVID19/FLU A&B COMBO
Covid Antigen, POC: NEGATIVE
Influenza A Antigen, POC: NEGATIVE
Influenza B Antigen, POC: NEGATIVE

## 2023-10-12 MED ORDER — AMOXICILLIN 875 MG PO TABS: 875.0000 mg | ORAL_TABLET | Freq: Two times a day (BID) | ORAL | 0 refills | Status: DC

## 2023-10-12 MED ORDER — BENZONATATE 200 MG PO CAPS: 200.0000 mg | ORAL_CAPSULE | Freq: Three times a day (TID) | ORAL | 0 refills | Status: DC | PRN

## 2023-10-12 NOTE — ED Triage Notes (Addendum)
Pt presents with a non-productive cough, chills, nasal congestion, a headache, back pain, bilateral ear pain and left upper abdominal pain since Friday 11/1. Pt reports "I have green mucus coming out of my nose" and "longer menstrual periods than normal, lasting eight to nine days." Pt reports she took Tylenol and "Dayquil with honey" last night with little to no improvement in her symptoms, "if anything it just made me go to sleep."

## 2023-10-12 NOTE — Discharge Instructions (Addendum)
You were seen today for an upper respiratory infection.  Your flu and covid tests were negative, which means this is likely another viral infection.   I have sent out a mediation to help with cough.   I am treating you for an ear infection as well today.  I have sent out an antibiotic to take twice/day x 10 days.  I recommend you get plenty of rest and increase fluids.  You may use tylenol for pain and fever.  Please return if not improving by the end of the week.

## 2023-10-12 NOTE — ED Provider Notes (Signed)
[facility]    CSN: [id number] Arrival date & time: 10/12/23  0102      History   Chief Complaint Chief Complaint  Patient presents with   Cough   Headache   Nasal Congestion   Abdominal Pain    HPI Deborah White is a 37 y.o. female.    Cough Associated symptoms: chills, ear pain, headaches, rhinorrhea and sore throat   Associated symptoms: no fever   Headache Associated symptoms: abdominal pain, congestion, cough, ear pain, fatigue and sore throat   Associated symptoms: no fever   Abdominal Pain Associated symptoms: chills, cough, fatigue and sore throat   Associated symptoms: no fever    Patient is here for several days of URI symptoms x 5 days. .  Having sinus congestion, drainage, cough, body aches, headaches, ear pain Having chills, no fever.  No n/v.  Daughter was sick with a cold, but her symptoms are worse.  No home covid testing.  Taking tylenol, dayquil       Past Medical History:  Diagnosis Date   Anemia    GBS (group B Streptococcus carrier), +RV culture, currently pregnant 02/05/2020   Thyroid disease     Patient Active Problem List   Diagnosis Date Noted   Insertion of Implanon 05/09/2020   Postpartum care following cesarean delivery 04/23/2020   Status post repeat low transverse cesarean section 09/18/2019   Family history of breast cancer in mother 01/31/2016   Breast lump in female 11/29/2013    Past Surgical History:  Procedure Laterality Date   BACK SURGERY  2010   cyst removal   CESAREAN SECTION N/A 06/11/2018   Procedure: CESAREAN SECTION;  Surgeon: Reva Bores, MD;  Location: [facility] SU[facility];  Service: Obstetrics;  Laterality: N/A;   CESAREAN SECTION N/A 03/07/2020   Procedure: CESAREAN SECTION;  Surgeon: Malachy Chamber, MD;  Location: [facility];  Service: Obstetrics;  Laterality: N/A;   WRIST SURGERY Left 2011   Cyst    OB History     Gravida  2   Para  2   Term  2   Preterm      AB       Living  2      SAB      IAB      Ectopic      Multiple  0   Live Births  2            Home Medications    Prior to Admission medications   Medication Sig Start Date End Date Taking? Authorizing Provider  acetaminophen (TYLENOL) 500 MG tablet Take 500 mg by mouth every 6 (six) hours as needed for moderate pain. Patient not taking: Reported on 08/09/2020    [provider]  b complex vitamins tablet Take 1 tablet by mouth daily.    [provider]  cetirizine (ZYRTEC) 10 MG tablet Take 1 tablet (10 mg total) by mouth daily. 06/14/23   Valentino Nose, NP  fluticasone (FLONASE) 50 MCG/ACT nasal spray Place 1 spray into both nostrils daily. 06/14/23   Valentino Nose, NP  Iron Combinations (CHROMAGEN) capsule Take 1 capsule by mouth daily.    [provider]  norethindrone-ethinyl estradiol 1/35 (ORTHO-NOVUM 1/35, 28,) tablet Take 1 tablet by mouth daily. 08/19/20   Brock Bad, MD  ondansetron (ZOFRAN ODT) 4 MG disintegrating tablet Take 1 tablet (4 mg total) by mouth every 8 (eight) hours as needed for nausea or  vomiting. 06/04/21   Haskel Schroeder, PA-C  pantoprazole (PROTONIX) 20 MG tablet Take 1 tablet (20 mg total) by mouth daily. 06/04/21   Haskel Schroeder, PA-C  sertraline (ZOLOFT) 50 MG tablet Take 1 tablet (50 mg total) by mouth daily. Patient not taking: Reported on 08/09/2020 05/27/20   Constant, Peggy, MD  sucralfate (CARAFATE) 1 g tablet Take 1 tablet (1 g total) by mouth 4 (four) times daily -  with meals and at bedtime. 06/04/21 07/04/21  Haskel Schroeder, PA-C  Vitamin D, Ergocalciferol, (DRISDOL) 1.25 MG (50000 UNIT) CAPS capsule Take 50,000 Units by mouth every 7 (seven) days.    [provider]    Family History Family History  Problem Relation Age of Onset   Breast cancer Mother    Hypertension Mother    Cancer Maternal Uncle        brain cancer    Hypertension Maternal Grandmother     Social  History Social History   Tobacco Use   Smoking status: Former    Current packs/day: 0.00    Types: Cigarettes    Quit date: 06/23/2016    Years since quitting: 7.3   Smokeless tobacco: Never  Vaping Use   Vaping status: Never Used  Substance Use Topics   Alcohol use: Yes    Comment: occasional   Drug use: No     Allergies   Patient has no known allergies.   Review of Systems Review of Systems  Constitutional:  Positive for chills and fatigue. Negative for fever.  HENT:  Positive for congestion, ear pain, rhinorrhea and sore throat.   Eyes: Negative.   Respiratory:  Positive for cough.   Gastrointestinal:  Positive for abdominal pain.  Genitourinary: Negative.   Musculoskeletal: Negative.   Neurological:  Positive for headaches.     Physical Exam Triage Vital Signs ED Triage Vitals  Encounter Vitals Group     BP 10/12/23 0958 131/83     Systolic BP Percentile --      Diastolic BP Percentile --      Pulse Rate 10/12/23 0958 (!) 105     Resp 10/12/23 0958 18     Temp 10/12/23 0958 99.2 F (37.3 C)     Temp Source 10/12/23 0958 Oral     SpO2 10/12/23 0958 95 %     Weight 10/12/23 1001 184 lb (83.5 kg)     Height --      Head Circumference --      Peak Flow --      Pain Score 10/12/23 1000 8     Pain Loc --      Pain Education --      Exclude from Growth Chart --    No data found.  Updated Vital Signs BP 131/83 (BP Location: Right Arm)   Pulse (!) 105   Temp 99.2 F (37.3 C) (Oral)   Resp 18   Wt 83.5 kg   LMP 09/29/2023 (Exact Date)   SpO2 95%   BMI 29.70 kg/m   Visual Acuity Right Eye Distance:   Left Eye Distance:   Bilateral Distance:    Right Eye Near:   Left Eye Near:    Bilateral Near:     Physical Exam Constitutional:      General: She is not in acute distress.    Appearance: She is well-developed. She is ill-appearing.  HENT:     Head: Normocephalic.     Left Ear: Tympanic membrane is erythematous.  Nose: Congestion and  rhinorrhea present.     Mouth/Throat:     Mouth: Mucous membranes are moist.     Pharynx: Posterior oropharyngeal erythema present. No oropharyngeal exudate.     Tonsils: No tonsillar exudate.  Cardiovascular:     Rate and Rhythm: Normal rate and regular rhythm.  Pulmonary:     Effort: Pulmonary effort is normal.     Breath sounds: Normal breath sounds.  Musculoskeletal:     Cervical back: Normal range of motion and neck supple. Tenderness present.  Lymphadenopathy:     Cervical: No cervical adenopathy.  Skin:    General: Skin is warm.  Neurological:     General: No focal deficit present.     Mental Status: She is alert.  Psychiatric:        Mood and Affect: Mood normal.      UC Treatments / Results  Labs (all labs ordered are listed, but only abnormal results are displayed) Labs Reviewed  POC COVID19/FLU A&B COMBO   Flu/covid negative  EKG   Radiology No results found.  Procedures Procedures (including critical care time)  Medications Ordered in UC Medications - No data to display  Initial Impression / Assessment and Plan / UC Course  I have reviewed the triage vital signs and the nursing notes.  Pertinent labs & imaging results that were available during my care of the patient were reviewed by me and considered in my medical decision making (see chart for details).   Final Clinical Impressions(s) / UC Diagnoses   Final diagnoses:  Viral upper respiratory tract infection  Non-recurrent acute suppurative otitis media of left ear without spontaneous rupture of tympanic membrane     Discharge Instructions      You were seen today for an upper respiratory infection.  Your flu and covid tests were negative, which means this is likely another viral infection.   I have sent out a mediation to help with cough.   I am treating you for an ear infection as well today.  I have sent out an antibiotic to take twice/day x 10 days.  I recommend you get plenty of rest  and increase fluids.  You may use tylenol for pain and fever.  Please return if not improving by the end of the week.     ED Prescriptions     Medication Sig Dispense Auth. Provider   amoxicillin (AMOXIL) 875 MG tablet Take 1 tablet (875 mg total) by mouth 2 (two) times daily. 20 tablet Juelz Whittenberg, MD   benzonatate (TESSALON) 200 MG capsule Take 1 capsule (200 mg total) by mouth 3 (three) times daily as needed for cough. 21 capsule Jannifer Franklin, MD      PDMP not reviewed this encounter.   Jannifer Franklin, MD 10/12/23 1038

## 2023-10-17 ENCOUNTER — Ambulatory Visit (HOSPITAL_COMMUNITY)
Admission: EM | Admit: 2023-10-17 | Discharge: 2023-10-17 | Disposition: A | Discharge status: Home / Self Care | Payer: BC Managed Care – PPO | Attending: Emergency Medicine | Admitting: Emergency Medicine

## 2023-10-17 ENCOUNTER — Ambulatory Visit (INDEPENDENT_AMBULATORY_CARE_PROVIDER_SITE_OTHER): Payer: BC Managed Care – PPO

## 2023-10-17 ENCOUNTER — Encounter (HOSPITAL_COMMUNITY): Payer: Self-pay

## 2023-10-17 DIAGNOSIS — J069 Acute upper respiratory infection, unspecified: Secondary | ICD-10-CM

## 2023-10-17 DIAGNOSIS — R051 Acute cough: Secondary | ICD-10-CM

## 2023-10-17 MED ORDER — PROMETHAZINE-DM 6.25-15 MG/5ML PO SYRP: 5.0000 mL | ORAL_SOLUTION | Freq: Four times a day (QID) | ORAL | 0 refills | Status: DC | PRN

## 2023-10-17 MED ORDER — AZITHROMYCIN 250 MG PO TABS: 250.0000 mg | ORAL_TABLET | Freq: Every day | ORAL | 0 refills | Status: DC

## 2023-10-17 NOTE — Discharge Instructions (Signed)
I am covering you with an additional antibiotic for a potential bacterial respiratory infection.  Continue taking the amoxicillin until finished.  Take both antibiotics as prescribed and with food.  You can continue to use the [employer] throughout the day.  Use the cough syrup at night, do not drink or drive on this medication as it may cause drowsiness.  I suggest sleeping with a humidifier and drinking at least 64 ounces of water to help loosen your secretions.  You can do 1200 mg of Mucinex daily as well to help break up your chest congestion.  I do not see obvious infection of the left ear.  Please finish all antibiotics.  The cough may persist for the next few weeks but the fatigue and sound of the cough should improve.  Please return to clinic if no improvement despite finishing the new antibiotics.

## 2023-10-17 NOTE — ED Triage Notes (Signed)
Patient here today with c/o cough, fever, body aches, left ear pain, nasal congestion, night sweats, and headache X 9 days. Patient was here 5 days ago. Not feeling any better. Has been taking the medication prescribed but not helping. She states that the Amoxicillin has been making her have an upset stomach.

## 2023-10-17 NOTE — ED Provider Notes (Signed)
[facility]    CSN: [id number] Arrival date & time: 10/17/23  1032      History   Chief Complaint Chief Complaint  Patient presents with   Cough    HPI Deborah White is a 37 y.o. female.   Patient presents to clinic for complaints of cough, wheezing, shortness of breath, subjective fever, body aches, continued left ear pain, nasal congestion, night sweats, generalized bodyaches and a headache that have been present for the past 9 days.  She was seen at this clinic on 11/5 and started on amoxicillin for otitis media.  Reports compliance with the amoxicillin, she has 4 days left and is on her sixth day of treatment total.  Continues to have left ear pain.  She has not checked her temperature at home, she is when she feels warm she puts a wet washcloth on her forehead.  She did go back to work yesterday and felt very fatigued.    The history is provided by the patient and medical records.  Cough   Past Medical History:  Diagnosis Date   Anemia    GBS (group B Streptococcus carrier), +RV culture, currently pregnant 02/05/2020   Thyroid disease     Patient Active Problem List   Diagnosis Date Noted   Insertion of Implanon 05/09/2020   Postpartum care following cesarean delivery 04/23/2020   Status post repeat low transverse cesarean section 09/18/2019   Family history of breast cancer in mother 01/31/2016   Breast lump in female 11/29/2013    Past Surgical History:  Procedure Laterality Date   BACK SURGERY  2010   cyst removal   CESAREAN SECTION N/A 06/11/2018   Procedure: CESAREAN SECTION;  Surgeon: Reva Bores, MD;  Location: [facility] BIRTH[facility] SU[facility];  Service: Obstetrics;  Laterality: N/A;   CESAREAN SECTION N/A 03/07/2020   Procedure: CESAREAN SECTION;  Surgeon: Malachy Chamber, MD;  Location: [facility];  Service: Obstetrics;  Laterality: N/A;   WRIST SURGERY Left 2011   Cyst    OB History     Gravida  2   Para  2   Term  2   Preterm       AB      Living  2      SAB      IAB      Ectopic      Multiple  0   Live Births  2            Home Medications    Prior to Admission medications   Medication Sig Start Date End Date Taking? Authorizing Provider  azithromycin (ZITHROMAX) 250 MG tablet Take 1 tablet (250 mg total) by mouth daily. Take first 2 tablets together, then 1 every day until finished. 10/17/23  Yes Rinaldo Ratel, [place] N, FNP  promethazine-dextromethorphan (PROMETHAZINE-DM) 6.25-15 MG/5ML syrup Take 5 mLs by mouth 4 (four) times daily as needed for cough. 10/17/23  Yes Rinaldo Ratel, [place] N, FNP  acetaminophen (TYLENOL) 500 MG tablet Take 500 mg by mouth every 6 (six) hours as needed for moderate pain. Patient not taking: Reported on 08/09/2020    [provider]  amoxicillin (AMOXIL) 875 MG tablet Take 1 tablet (875 mg total) by mouth 2 (two) times daily. 10/12/23   Piontek, Denny Peon, MD  b complex vitamins tablet Take 1 tablet by mouth daily.    [provider]  benzonatate (TESSALON) 200 MG capsule Take 1 capsule (200 mg total) by mouth 3 (three) times daily as  needed for cough. 10/12/23   Piontek, Denny Peon, MD  cetirizine (ZYRTEC) 10 MG tablet Take 1 tablet (10 mg total) by mouth daily. 06/14/23   Valentino Nose, NP  Iron Combinations (CHROMAGEN) capsule Take 1 capsule by mouth daily.    [provider]  norethindrone-ethinyl estradiol 1/35 (ORTHO-NOVUM 1/35, 28,) tablet Take 1 tablet by mouth daily. 08/19/20   Brock Bad, MD  sertraline (ZOLOFT) 50 MG tablet Take 1 tablet (50 mg total) by mouth daily. Patient not taking: Reported on 08/09/2020 05/27/20   Constant, Peggy, MD  Vitamin D, Ergocalciferol, (DRISDOL) 1.25 MG (50000 UNIT) CAPS capsule Take 50,000 Units by mouth every 7 (seven) days.    [provider]    Family History Family History  Problem Relation Age of Onset   Breast cancer Mother    Hypertension Mother    Cancer Maternal Uncle        brain cancer     Hypertension Maternal Grandmother     Social History Social History   Tobacco Use   Smoking status: Former    Current packs/day: 0.00    Types: Cigarettes    Quit date: 06/23/2016    Years since quitting: 7.3   Smokeless tobacco: Never  Vaping Use   Vaping status: Never Used  Substance Use Topics   Alcohol use: Yes    Comment: occasional   Drug use: No     Allergies   Patient has no known allergies.   Review of Systems Review of Systems  Per HPI   Physical Exam Triage Vital Signs ED Triage Vitals  Encounter Vitals Group     BP 10/17/23 1152 111/78     Systolic BP Percentile --      Diastolic BP Percentile --      Pulse Rate 10/17/23 1152 91     Resp 10/17/23 1152 16     Temp 10/17/23 1152 98.4 F (36.9 C)     Temp Source 10/17/23 1152 Oral     SpO2 10/17/23 1152 97 %     Weight 10/17/23 1151 182 lb (82.6 kg)     Height 10/17/23 1151 5\' 6"  (1.676 m)     Head Circumference --      Peak Flow --      Pain Score 10/17/23 1151 8     Pain Loc --      Pain Education --      Exclude from Growth Chart --    No data found.  Updated Vital Signs BP 111/78 (BP Location: Left Arm)   Pulse 91   Temp 98.4 F (36.9 C) (Oral)   Resp 16   Ht 5\' 6"  (1.676 m)   Wt 182 lb (82.6 kg)   LMP 09/29/2023 (Exact Date)   SpO2 97%   BMI 29.38 kg/m   Visual Acuity Right Eye Distance:   Left Eye Distance:   Bilateral Distance:    Right Eye Near:   Left Eye Near:    Bilateral Near:     Physical Exam Vitals and nursing note reviewed.  Constitutional:      Appearance: Normal appearance.  HENT:     Head: Normocephalic and atraumatic.     Right Ear: Tympanic membrane, ear canal and external ear normal.     Left Ear: Tympanic membrane, ear canal and external ear normal.     Nose: Nose normal.     Mouth/Throat:     Mouth: Mucous membranes are moist.     Pharynx:  Posterior oropharyngeal erythema present.  Eyes:     Conjunctiva/sclera: Conjunctivae normal.   Cardiovascular:     Rate and Rhythm: Normal rate and regular rhythm.     Heart sounds: Normal heart sounds. No murmur heard. Pulmonary:     Effort: Pulmonary effort is normal.     Breath sounds: Examination of the right-middle field reveals decreased breath sounds. Examination of the right-lower field reveals decreased breath sounds. Decreased breath sounds present.  Musculoskeletal:        General: Normal range of motion.  Lymphadenopathy:     Cervical: Cervical adenopathy present.  Skin:    General: Skin is warm.  Neurological:     General: No focal deficit present.     Mental Status: She is alert.  Psychiatric:        Mood and Affect: Mood normal.        Behavior: Behavior is cooperative.      UC Treatments / Results  Labs (all labs ordered are listed, but only abnormal results are displayed) Labs Reviewed - No data to display  EKG   Radiology DG Chest 2 View  Result Date: 10/17/2023 CLINICAL DATA:  Shortness of breath, cough. EXAM: CHEST - 2 VIEW COMPARISON:  None Available. FINDINGS: The heart size and mediastinal contours are within normal limits. Both lungs are clear. The visualized skeletal structures are unremarkable. IMPRESSION: No active cardiopulmonary disease. Electronically Signed   By: Lupita Raider M.D.   On: 10/17/2023 13:15    Procedures Procedures (including critical care time)  Medications Ordered in UC Medications - No data to display  Initial Impression / Assessment and Plan / UC Course  I have reviewed the triage vital signs and the nursing notes.  Pertinent labs & imaging results that were available during my care of the patient were reviewed by me and considered in my medical decision making (see chart for details).  Vitals and triage reviewed, patient is hemodynamically stable.  Lungs without any wheezing or rhonchi, slightly diminished in right lower lobe.  Heart with regular rate and rhythm.  Posterior pharynx with erythema.  Tympanic  membrane with slight erythema and fluid behind it, no bulging or perforation.  Chest x-ray obtained, awaiting official radiology over-read which showed no active cardiopulmonary processes.  Will cover with azithromycin for CAP/ bacterial URI due to continued symptoms and encouraged patient to finish amoxicillin.  Cough management discussed.  Plan of care, follow-up care return precautions given.    Final Clinical Impressions(s) / UC Diagnoses   Final diagnoses:  Upper respiratory tract infection, unspecified type  Acute cough     Discharge Instructions      I am covering you with an additional antibiotic for a potential bacterial respiratory infection.  Continue taking the amoxicillin until finished.  Take both antibiotics as prescribed and with food.  You can continue to use the [employer] throughout the day.  Use the cough syrup at night, do not drink or drive on this medication as it may cause drowsiness.  I suggest sleeping with a humidifier and drinking at least 64 ounces of water to help loosen your secretions.  You can do 1200 mg of Mucinex daily as well to help break up your chest congestion.  I do not see obvious infection of the left ear.  Please finish all antibiotics.  The cough may persist for the next few weeks but the fatigue and sound of the cough should improve.  Please return to clinic if no improvement  despite finishing the new antibiotics.      ED Prescriptions     Medication Sig Dispense Auth. Provider   promethazine-dextromethorphan (PROMETHAZINE-DM) 6.25-15 MG/5ML syrup Take 5 mLs by mouth 4 (four) times daily as needed for cough. 118 mL Rinaldo Ratel, [place] N, FNP   azithromycin (ZITHROMAX) 250 MG tablet Take 1 tablet (250 mg total) by mouth daily. Take first 2 tablets together, then 1 every day until finished. 6 tablet Chevonne Bostrom, [place] N, [place]      PDMP not reviewed this encounter.   Sarah Baez, [place] N, [place] 10/17/23 [phone]

## 2023-11-07 DIAGNOSIS — Z419 Encounter for procedure for purposes other than remedying health state, unspecified: Secondary | ICD-10-CM | POA: Diagnosis not present

## 2023-12-08 DIAGNOSIS — Z419 Encounter for procedure for purposes other than remedying health state, unspecified: Secondary | ICD-10-CM | POA: Diagnosis not present

## 2024-01-08 DIAGNOSIS — Z419 Encounter for procedure for purposes other than remedying health state, unspecified: Secondary | ICD-10-CM | POA: Diagnosis not present

## 2024-02-05 DIAGNOSIS — Z419 Encounter for procedure for purposes other than remedying health state, unspecified: Secondary | ICD-10-CM | POA: Diagnosis not present

## 2024-02-11 ENCOUNTER — Other Ambulatory Visit (HOSPITAL_COMMUNITY): Payer: Self-pay | Admitting: Physician Assistant

## 2024-02-11 ENCOUNTER — Ambulatory Visit
Admission: RE | Admit: 2024-02-11 | Discharge: 2024-02-11 | Disposition: A | Discharge status: Home / Self Care | Payer: BC Managed Care – PPO | Source: Ambulatory Visit | Attending: Physician Assistant | Admitting: Physician Assistant

## 2024-02-11 DIAGNOSIS — N6320 Unspecified lump in the left breast, unspecified quadrant: Secondary | ICD-10-CM

## 2024-02-21 ENCOUNTER — Encounter (HOSPITAL_COMMUNITY): Payer: Self-pay | Admitting: Emergency Medicine

## 2024-02-21 ENCOUNTER — Ambulatory Visit (HOSPITAL_COMMUNITY)
Admission: EM | Admit: 2024-02-21 | Discharge: 2024-02-21 | Disposition: A | Discharge status: Home / Self Care | Attending: Family Medicine | Admitting: Family Medicine

## 2024-02-21 ENCOUNTER — Other Ambulatory Visit: Payer: Self-pay

## 2024-02-21 DIAGNOSIS — J101 Influenza due to other identified influenza virus with other respiratory manifestations: Secondary | ICD-10-CM

## 2024-02-21 LAB — POCT INFLUENZA A/B
Influenza A, POC: POSITIVE — AB
Influenza B, POC: NEGATIVE

## 2024-02-21 MED ORDER — OSELTAMIVIR PHOSPHATE 75 MG PO CAPS: 75.0000 mg | ORAL_CAPSULE | Freq: Two times a day (BID) | ORAL | 0 refills | Status: DC

## 2024-02-21 NOTE — ED Provider Notes (Signed)
 [facility]    CSN: [id number] Arrival date & time: 02/21/24  0809      History   Chief Complaint Chief Complaint  Patient presents with   Cough    HPI Deborah White is a 38 y.o. female.   Patient is presenting with 1 day history of headache, runny nose and cough.  Patient notes fatigue as well as some warmth but has not measured her temperature.  Patient has no fever in clinic today.  Patient also notes some mild ear pain and congestion.  Patient notes that she has no other sick contacts that she knows of.  Patient does live at home with her 2 kids and sister.   Cough   Past Medical History:  Diagnosis Date   Anemia    GBS (group B Streptococcus carrier), +RV culture, currently pregnant 02/05/2020   Thyroid disease     Patient Active Problem List   Diagnosis Date Noted   Insertion of Implanon 05/09/2020   Postpartum care following cesarean delivery 04/23/2020   Status post repeat low transverse cesarean section 09/18/2019   Family history of breast cancer in mother 01/31/2016   Breast lump in female 11/29/2013    Past Surgical History:  Procedure Laterality Date   BACK SURGERY  2010   cyst removal   CESAREAN SECTION N/A 06/11/2018   Procedure: CESAREAN SECTION;  Surgeon: Reva Bores, MD;  Location: [facility]THING SU[facility];  Service: Obstetrics;  Laterality: N/A;   CESAREAN SECTION N/A 03/07/2020   Procedure: CESAREAN SECTION;  Surgeon: Malachy Chamber, MD;  Location: [facility];  Service: Obstetrics;  Laterality: N/A;   WRIST SURGERY Left 2011   Cyst    OB History     Gravida  2   Para  2   Term  2   Preterm      AB      Living  2      SAB      IAB      Ectopic      Multiple  0   Live Births  2            Home Medications    Prior to Admission medications   Medication Sig Start Date End Date Taking? Authorizing Provider  oseltamivir (TAMIFLU) 75 MG capsule Take 1 capsule (75 mg total) by mouth every 12 (twelve) hours.  02/21/24  Yes Brenton Grills, MD  acetaminophen (TYLENOL) 500 MG tablet Take 500 mg by mouth every 6 (six) hours as needed for moderate pain. Patient not taking: Reported on 08/09/2020    [provider]  amoxicillin (AMOXIL) 875 MG tablet Take 1 tablet (875 mg total) by mouth 2 (two) times daily. 10/12/23   Piontek, Denny Peon, MD  azithromycin (ZITHROMAX) 250 MG tablet Take 1 tablet (250 mg total) by mouth daily. Take first 2 tablets together, then 1 every day until finished. 10/17/23   Garrison, [place] N, FNP  b complex vitamins tablet Take 1 tablet by mouth daily.    [provider]  benzonatate (TESSALON) 200 MG capsule Take 1 capsule (200 mg total) by mouth 3 (three) times daily as needed for cough. 10/12/23   Piontek, Denny Peon, MD  cetirizine (ZYRTEC) 10 MG tablet Take 1 tablet (10 mg total) by mouth daily. 06/14/23   Valentino Nose, NP  Iron Combinations (CHROMAGEN) capsule Take 1 capsule by mouth daily.    [provider]  norethindrone-ethinyl estradiol 1/35 (ORTHO-NOVUM 1/35, 28,) tablet Take 1  tablet by mouth daily. 08/19/20   Brock Bad, MD  promethazine-dextromethorphan (PROMETHAZINE-DM) 6.25-15 MG/5ML syrup Take 5 mLs by mouth 4 (four) times daily as needed for cough. 10/17/23   Garrison, [place] N, FNP  sertraline (ZOLOFT) 50 MG tablet Take 1 tablet (50 mg total) by mouth daily. Patient not taking: Reported on 08/09/2020 05/27/20   Constant, Peggy, MD  Vitamin D, Ergocalciferol, (DRISDOL) 1.25 MG (50000 UNIT) CAPS capsule Take 50,000 Units by mouth every 7 (seven) days.    [provider]    Family History Family History  Problem Relation Age of Onset   Breast cancer Mother    Hypertension Mother    Cancer Maternal Uncle        brain cancer    Hypertension Maternal Grandmother     Social History Social History   Tobacco Use   Smoking status: Some Days    Current packs/day: 0.00    Types: Cigarettes    Last attempt to quit: 06/23/2016    Years  since quitting: 7.6   Smokeless tobacco: Never  Vaping Use   Vaping status: Never Used  Substance Use Topics   Alcohol use: Yes    Comment: occasional   Drug use: No     Allergies   Patient has no known allergies.   Review of Systems Review of Systems  Respiratory:  Positive for cough.      Physical Exam Triage Vital Signs ED Triage Vitals  Encounter Vitals Group     BP 02/21/24 0836 117/77     Systolic BP Percentile --      Diastolic BP Percentile --      Pulse Rate 02/21/24 0836 99     Resp 02/21/24 0836 20     Temp 02/21/24 0836 98.8 F (37.1 C)     Temp Source 02/21/24 0836 Oral     SpO2 02/21/24 0836 96 %     Weight --      Height --      Head Circumference --      Peak Flow --      Pain Score 02/21/24 0833 8     Pain Loc --      Pain Education --      Exclude from Growth Chart --    No data found.  Updated Vital Signs BP 117/77 (BP Location: Left Arm)   Pulse 99   Temp 98.8 F (37.1 C) (Oral)   Resp 20   LMP 02/20/2024   SpO2 96%   Visual Acuity Right Eye Distance:   Left Eye Distance:   Bilateral Distance:    Right Eye Near:   Left Eye Near:    Bilateral Near:     Physical Exam Constitutional:      Appearance: Normal appearance.  HENT:     Left Ear: Tympanic membrane, ear canal and external ear normal. There is no impacted cerumen.  Cardiovascular:     Rate and Rhythm: Normal rate and regular rhythm.     Pulses: Normal pulses.     Heart sounds: Normal heart sounds.  Pulmonary:     Effort: Pulmonary effort is normal.     Breath sounds: Normal breath sounds.  Neurological:     Mental Status: She is alert.      UC Treatments / Results  Labs (all labs ordered are listed, but only abnormal results are displayed) Labs Reviewed  POCT INFLUENZA A/B - Abnormal; Notable for the following components:  Result Value   Influenza A, POC Positive (*)    All other components within normal limits    EKG   Radiology No results  found.  Procedures Procedures (including critical care time)  Medications Ordered in UC Medications - No data to display  Initial Impression / Assessment and Plan / UC Course  I have reviewed the triage vital signs and the nursing notes.  Pertinent labs & imaging results that were available during my care of the patient were reviewed by me and considered in my medical decision making (see chart for details).     Patient is positive for influenza A, given recent symptoms, we will go ahead and prescribe patient Tamiflu.  Also discussed with patient to message her kids pediatrician and ask regarding preventative Tamiflu for them.  Patient advised to stay hydrated and quarantine if possible.  Patient understanding.  Final diagnoses:  Influenza A     Discharge Instructions      Please take your Tamiflu, 75 mg twice daily for the next 5 days.  Please continue staying hydrated and if possible to try and quarantine from your family as you are still infectious at this time.     ED Prescriptions     Medication Sig Dispense Auth. Provider   oseltamivir (TAMIFLU) 75 MG capsule Take 1 capsule (75 mg total) by mouth every 12 (twelve) hours. 10 capsule Brenton Grills, MD      PDMP not reviewed this encounter.   Brenton Grills, MD 02/21/24 [phone]

## 2024-02-21 NOTE — ED Triage Notes (Signed)
 Onset one day ago.  Has a headache, runny nose, cough.  Has fatigue and left ear pain.  Patient has taken tylenol and used vicks vapor rub on chest and nose.

## 2024-02-21 NOTE — Discharge Instructions (Addendum)
 Please take your Tamiflu, 75 mg twice daily for the next 5 days.  Please continue staying hydrated and if possible to try and quarantine from your family as you are still infectious at this time.

## 2024-03-18 DIAGNOSIS — Z419 Encounter for procedure for purposes other than remedying health state, unspecified: Secondary | ICD-10-CM | POA: Diagnosis not present

## 2024-04-17 DIAGNOSIS — Z419 Encounter for procedure for purposes other than remedying health state, unspecified: Secondary | ICD-10-CM | POA: Diagnosis not present

## 2024-05-18 DIAGNOSIS — Z419 Encounter for procedure for purposes other than remedying health state, unspecified: Secondary | ICD-10-CM | POA: Diagnosis not present

## 2024-06-07 ENCOUNTER — Ambulatory Visit (HOSPITAL_COMMUNITY)
Admission: EM | Admit: 2024-06-07 | Discharge: 2024-06-07 | Disposition: A | Discharge status: Home / Self Care | Attending: Physician Assistant | Admitting: Physician Assistant

## 2024-06-07 ENCOUNTER — Encounter (HOSPITAL_COMMUNITY): Payer: Self-pay

## 2024-06-07 DIAGNOSIS — R5383 Other fatigue: Secondary | ICD-10-CM

## 2024-06-07 DIAGNOSIS — T7840XA Allergy, unspecified, initial encounter: Secondary | ICD-10-CM | POA: Diagnosis not present

## 2024-06-07 MED ORDER — PREDNISONE 50 MG PO TABS: ORAL_TABLET | ORAL | 0 refills | Status: DC

## 2024-06-07 NOTE — ED Provider Notes (Signed)
 [facility]    CSN: [id number] Arrival date & time: 06/07/24  9182      History   Chief Complaint Chief Complaint  Patient presents with   Fatigue    HPI Deborah White is a 38 y.o. female.   Patient reports that she has had fatigue for several months.  Patient reports that she has a history of thyroid  disease when she was younger she is not currently on any medications.  Patient reports that she was seen by a weight loss center and was told that her vitamin B and vitamin D  were low.  Patient reports that she has been having allergy symptoms for the past 2 weeks she has been taking Zyrtec  without relief.  Patient reports that her eyes have been itchy.  She has had a runny nose.     The history is provided by the patient. No language interpreter was used.    Past Medical History:  Diagnosis Date   Anemia    GBS (group B Streptococcus carrier), +RV culture, currently pregnant 02/05/2020   Thyroid  disease     Patient Active Problem List   Diagnosis Date Noted   Insertion of Implanon  05/09/2020   Postpartum care following cesarean delivery 04/23/2020   Status post repeat low transverse cesarean section 09/18/2019   Family history of breast cancer in mother 01/31/2016   Breast lump in female 11/29/2013    Past Surgical History:  Procedure Laterality Date   BACK SURGERY  2010   cyst removal   CESAREAN SECTION N/A 06/11/2018   Procedure: CESAREAN SECTION;  Surgeon: Fredirick Glenys RAMAN, MD;  Location: [facility]TH[facility];  Service: Obstetrics;  Laterality: N/A;   CESAREAN SECTION N/A 03/07/2020   Procedure: CESAREAN SECTION;  Surgeon: Trudy Rozelle DASEN, MD;  Location: [facility];  Service: Obstetrics;  Laterality: N/A;   WRIST SURGERY Left 2011   Cyst    OB History     Gravida  2   Para  2   Term  2   Preterm      AB      Living  2      SAB      IAB      Ectopic      Multiple  0   Live Births  2            Home Medications    Prior to  Admission medications   Medication Sig Start Date End Date Taking? Authorizing Provider  predniSONE  (DELTASONE ) 50 MG tablet One tablet a day for 5 day 06/07/24  Yes Aiyanna Awtrey K, PA-C  acetaminophen  (TYLENOL ) 500 MG tablet Take 500 mg by mouth every 6 (six) hours as needed for moderate pain. Patient not taking: Reported on 08/09/2020    [provider]  b complex vitamins tablet Take 1 tablet by mouth daily. Patient not taking: Reported on 06/07/2024    [provider]  cetirizine  (ZYRTEC ) 10 MG tablet Take 1 tablet (10 mg total) by mouth daily. 06/14/23   Chandra Harlene DELENA, NP  Iron Combinations (CHROMAGEN) capsule Take 1 capsule by mouth daily.    [provider]  Vitamin D , Ergocalciferol , (DRISDOL ) 1.25 MG (50000 UNIT) CAPS capsule Take 50,000 Units by mouth every 7 (seven) days. Patient not taking: Reported on 06/07/2024    [provider]    Family History Family History  Problem Relation Age of Onset   Breast cancer Mother    Hypertension Mother  Cancer Maternal Uncle        brain cancer    Hypertension Maternal Grandmother     Social History Social History   Tobacco Use   Smoking status: Some Days    Current packs/day: 0.00    Types: Cigarettes    Last attempt to quit: 06/23/2016    Years since quitting: 7.9   Smokeless tobacco: Never  Vaping Use   Vaping status: Never Used  Substance Use Topics   Alcohol use: Yes    Comment: occasional   Drug use: No     Allergies   Patient has no known allergies.   Review of Systems Review of Systems  All other systems reviewed and are negative.    Physical Exam Triage Vital Signs ED Triage Vitals  Encounter Vitals Group     BP 06/07/24 0831 98/67     Girls Systolic BP Percentile --      Girls Diastolic BP Percentile --      Boys Systolic BP Percentile --      Boys Diastolic BP Percentile --      Pulse Rate 06/07/24 0831 76     Resp 06/07/24 0831 16     Temp 06/07/24 0831 97.9  F (36.6 C)     Temp Source 06/07/24 0831 Oral     SpO2 06/07/24 0831 97 %     Weight --      Height --      Head Circumference --      Peak Flow --      Pain Score 06/07/24 0830 8     Pain Loc --      Pain Education --      Exclude from Growth Chart --    No data found.  Updated Vital Signs BP 98/67 (BP Location: Right Arm)   Pulse 76   Temp 97.9 F (36.6 C) (Oral)   Resp 16   LMP 05/18/2024 (Exact Date)   SpO2 97%   Visual Acuity Right Eye Distance:   Left Eye Distance:   Bilateral Distance:    Right Eye Near:   Left Eye Near:    Bilateral Near:     Physical Exam Vitals and nursing note reviewed.  Constitutional:      Appearance: She is well-developed.  HENT:     Head: Normocephalic.     Nose: Congestion and rhinorrhea present.     Mouth/Throat:     Mouth: Mucous membranes are moist.  Eyes:     Pupils: Pupils are equal, round, and reactive to light.     Comments: Slightly injected conjunctiva  Cardiovascular:     Rate and Rhythm: Normal rate.  Pulmonary:     Effort: Pulmonary effort is normal.  Abdominal:     General: There is no distension.  Musculoskeletal:        General: Normal range of motion.     Cervical back: Normal range of motion.  Skin:    General: Skin is warm.  Neurological:     General: No focal deficit present.     Mental Status: She is alert and oriented to person, place, and time.      UC Treatments / Results  Labs (all labs ordered are listed, but only abnormal results are displayed) Labs Reviewed - No data to display  EKG   Radiology No results found.  Procedures Procedures (including critical care time)  Medications Ordered in UC Medications - No data to display  Initial Impression / Assessment  and Plan / UC Course  I have reviewed the triage vital signs and the nursing notes.  Pertinent labs & imaging results that were available during my care of the patient were reviewed by me and considered in my medical  decision making (see chart for details).     Patient is advised to take a multivitamin and advised that she can take a B vitamin supplement.  I have advised her to follow-up with her primary care physician for thyroid  testing.  Patient had B vitamin testing within the past year which was normal.  I will treat patient with a short course of prednisone  for her allergies. Final Clinical Impressions(s) / UC Diagnoses   Final diagnoses:  Allergy, initial encounter  Other fatigue     Discharge Instructions      Schedule to see primary care for evaltuion of your thyroid  and vitamin levels.    ED Prescriptions     Medication Sig Dispense Auth. Provider   predniSONE  (DELTASONE ) 50 MG tablet One tablet a day for 5 day 5 tablet Flint Sonny POUR, PA-C      PDMP not reviewed this encounter. An After Visit Summary was printed and given to the patient.       Flint Sonny POUR, PA-C 06/07/24 [phone]

## 2024-06-07 NOTE — Discharge Instructions (Addendum)
 Schedule to see primary care for evaltuion of your thyroid  and vitamin levels.

## 2024-06-07 NOTE — ED Triage Notes (Signed)
 Patient here today with c/o fatigue, loss of appetites, seasonal allergies (itchy eyes), headache, and runny nose X 2 weeks. She has been taking Cetirizine  BID with some relief.

## 2024-06-17 DIAGNOSIS — Z419 Encounter for procedure for purposes other than remedying health state, unspecified: Secondary | ICD-10-CM | POA: Diagnosis not present

## 2024-06-30 ENCOUNTER — Ambulatory Visit: Admitting: Nurse Practitioner

## 2024-07-18 DIAGNOSIS — Z419 Encounter for procedure for purposes other than remedying health state, unspecified: Secondary | ICD-10-CM | POA: Diagnosis not present

## 2024-08-18 DIAGNOSIS — Z419 Encounter for procedure for purposes other than remedying health state, unspecified: Secondary | ICD-10-CM | POA: Diagnosis not present

## 2024-08-22 ENCOUNTER — Emergency Department (HOSPITAL_COMMUNITY)
Admission: EM | Admit: 2024-08-22 | Discharge: 2024-08-22 | Disposition: A | Discharge status: Home / Self Care | Attending: Emergency Medicine | Admitting: Emergency Medicine

## 2024-08-22 ENCOUNTER — Encounter (HOSPITAL_COMMUNITY): Payer: Self-pay

## 2024-08-22 ENCOUNTER — Other Ambulatory Visit: Payer: Self-pay

## 2024-08-22 DIAGNOSIS — R21 Rash and other nonspecific skin eruption: Secondary | ICD-10-CM | POA: Diagnosis present

## 2024-08-22 DIAGNOSIS — R109 Unspecified abdominal pain: Secondary | ICD-10-CM | POA: Insufficient documentation

## 2024-08-22 MED ORDER — TRIAMCINOLONE ACETONIDE 0.1 % EX CREA
1.0000 | TOPICAL_CREAM | Freq: Two times a day (BID) | CUTANEOUS | 0 refills | Status: DC
Start: 2024-08-22 — End: 2024-10-19

## 2024-08-22 NOTE — Discharge Instructions (Addendum)
 As we discussed, I have given you a prescription for a steroid cream that you can put on the areas that are itchy. Do not put this around your eyes or genitals.  You can use over-the-counter artificial tears for your eyes and a moisturizer on your skin around your eyes.  I recommend that you see a dermatologist for long-term management of your symptoms.  [facility] [phone] [address]  Return if development of any new or worsening symptoms.

## 2024-08-22 NOTE — ED Notes (Signed)
 Pt given discharge instructions and verbalized understanding. Opportunity given to ask questions.

## 2024-08-22 NOTE — ED Triage Notes (Signed)
 Pt has little dry patches on her skin that come and go and are itchy.  This has been ongoing for 6 months.

## 2024-08-22 NOTE — ED Provider Notes (Signed)
 [place] EMERGENCY DEPARTMENT AT [facility] Provider Note   CSN: [id number] Arrival date & time: 08/22/24  9077     Patient presents with: Rash   Deborah White is a 38 y.o. female.   Patient with no pertinent past medical history presents today with complaints of rash.  She reports that for the past 6 months she has had itchy patches on her extremities.  Reports she has tried topical cetirizine  with some improvement, however reports the relief is short-lived.  She has not seen anyone else for the symptoms.  Denies any systemic symptoms, no fevers or chills, no joint pain or joint swelling.   The history is provided by the patient. No language interpreter was used.  Rash      Prior to Admission medications   Medication Sig Start Date End Date Taking? Authorizing Provider  acetaminophen  (TYLENOL ) 500 MG tablet Take 500 mg by mouth every 6 (six) hours as needed for moderate pain. Patient not taking: Reported on 08/09/2020    [provider]  b complex vitamins tablet Take 1 tablet by mouth daily. Patient not taking: Reported on 06/07/2024    [provider]  cetirizine  (ZYRTEC ) 10 MG tablet Take 1 tablet (10 mg total) by mouth daily. 06/14/23   Chandra Harlene LABOR, NP  Iron Combinations (CHROMAGEN) capsule Take 1 capsule by mouth daily.    [provider]  predniSONE  (DELTASONE ) 50 MG tablet One tablet a day for 5 day 06/07/24   Flint Sonny POUR, PA-C  Vitamin D , Ergocalciferol , (DRISDOL ) 1.25 MG (50000 UNIT) CAPS capsule Take 50,000 Units by mouth every 7 (seven) days. Patient not taking: Reported on 06/07/2024    [provider]    Allergies: Patient has no known allergies.    Review of Systems  Skin:  Positive for rash.  All other systems reviewed and are negative.   Updated Vital Signs BP 118/75 (BP Location: Right Arm)   Pulse 73   Temp 98.6 F (37 C)   Resp 17   Ht 5' 6 (1.676 m)   Wt 89.8 kg   LMP 08/08/2024 (Approximate)    SpO2 100%   BMI 31.96 kg/m   Physical Exam Vitals and nursing note reviewed.  Constitutional:      General: She is not in acute distress.    Appearance: Normal appearance. She is normal weight. She is not ill-appearing, toxic-appearing or diaphoretic.  HENT:     Head: Normocephalic and atraumatic.  Cardiovascular:     Rate and Rhythm: Normal rate.  Pulmonary:     Effort: Pulmonary effort is normal. No respiratory distress.  Musculoskeletal:        General: Normal range of motion.     Cervical back: Normal range of motion.  Skin:    General: Skin is warm and dry.     Comments: Erythematous scaly patches noted to the right ac as well as the posterior left arm and left underarm. No swelling, fluctuance, induration, purulence.  No tracking erythema.  No joint swelling.  ROM intact to bilateral upper and lower extremities.  Compartments are soft.  Neurological:     General: No focal deficit present.     Mental Status: She is alert.  Psychiatric:        Mood and Affect: Mood normal.        Behavior: Behavior normal.     (all labs ordered are listed, but only abnormal results are displayed) Labs Reviewed - No data to  display  EKG: None  Radiology: No results found.   Procedures   Medications Ordered in the ED - No data to display                                  Medical Decision Making  Patient presents today with complaints of rash x 6 months.  She is afebrile, nontoxic-appearing, and in no acute distress reassuring vital signs.  Physical exam reveals erythematous scaly patches noted to the right ac as well as the posterior left arm and left underarm. No swelling, fluctuance, induration, purulence.  No tracking erythema.  No joint swelling.  ROM intact to bilateral upper and lower extremities.  Compartments are soft. Rash consistent with atopic dermatitis. Patient denies any difficulty breathing or swallowing.  Pt has a patent airway without stridor and is handling  secretions without difficulty; no angioedema. No blisters, no pustules, no warmth, no draining sinus tracts, no superficial abscesses, no bullous impetigo, no vesicles, no desquamation, no target lesions with dusky purpura or a central bulla. Not tender to touch. No concern for superimposed infection. No concern for SJS, TEN, TSS, tick borne illness, syphilis or other life-threatening condition. No concern for septic arthritis. Will discharge home with short course of steroids, pepcid and recommend Benadryl  as needed for pruritis. Evaluation and diagnostic testing in the emergency department does not suggest an emergent condition requiring admission or immediate intervention beyond what has been performed at this time.  Plan for discharge with close PCP follow-up.  Patient is understanding and amenable with plan, educated on red flag symptoms that would prompt immediate return.  Patient discharged in stable condition.  Final diagnoses:  Rash    ED Discharge Orders          Ordered    triamcinolone  cream (KENALOG ) 0.1 %  2 times daily        08/22/24 1707          An After Visit Summary was printed and given to the patient.      Deborah White 08/22/24 1710    Francesca Elsie CROME, MD 08/22/24 2006

## 2024-09-17 DIAGNOSIS — Z419 Encounter for procedure for purposes other than remedying health state, unspecified: Secondary | ICD-10-CM | POA: Diagnosis not present

## 2024-10-18 ENCOUNTER — Ambulatory Visit: Admitting: Physician Assistant

## 2024-10-19 ENCOUNTER — Encounter (HOSPITAL_COMMUNITY): Payer: Self-pay

## 2024-10-19 ENCOUNTER — Ambulatory Visit (HOSPITAL_COMMUNITY)
Admission: EM | Admit: 2024-10-19 | Discharge: 2024-10-19 | Disposition: A | Discharge status: Home / Self Care | Attending: Family Medicine | Admitting: Family Medicine

## 2024-10-19 DIAGNOSIS — Z1152 Encounter for screening for COVID-19: Secondary | ICD-10-CM

## 2024-10-19 DIAGNOSIS — J069 Acute upper respiratory infection, unspecified: Secondary | ICD-10-CM

## 2024-10-19 LAB — POC COVID19/FLU A&B COMBO
Covid Antigen, POC: NEGATIVE
Influenza A Antigen, POC: NEGATIVE
Influenza B Antigen, POC: NEGATIVE

## 2024-10-19 MED ORDER — BENZONATATE 200 MG PO CAPS: 200.0000 mg | ORAL_CAPSULE | Freq: Three times a day (TID) | ORAL | 0 refills | Status: AC | PRN

## 2024-10-19 MED ORDER — PREDNISONE 20 MG PO TABS: 40.0000 mg | ORAL_TABLET | Freq: Every day | ORAL | 0 refills | Status: AC

## 2024-10-19 NOTE — ED Triage Notes (Signed)
 Patient reports that she has been having a non productive cough, nasal congestion, sore throat, and headache x 3 days.  Patient has been taking Dayquil.

## 2024-10-19 NOTE — Discharge Instructions (Signed)
 You were seen today for an upper respiratory infection.  Your flu/covid swab was negative.  I have sent out a steroid and cough medication for you symptoms.  Please get rest and fluids.  Please return if not improving as expected.

## 2024-10-19 NOTE — ED Provider Notes (Signed)
 [facility]    CSN: [id number] Arrival date & time: 10/19/24  9141      History   Chief Complaint Chief Complaint  Patient presents with   Cough   Nasal Congestion   Sore Throat   Headache    HPI Deborah White is a 38 y.o. female.    Cough Associated symptoms: headaches and rhinorrhea   Sore Throat Associated symptoms include headaches.  Headache Associated symptoms: congestion, cough and fatigue    Patient is here for URI symptoms x 3 days.  Having cough, congestion, sore throat, headache, and pain her chest with coughing.  She feels hot, but no fevers.  Some sob at night.  She did take dayquil without much help. `       Past Medical History:  Diagnosis Date   Anemia    GBS (group B Streptococcus carrier), +RV culture, currently pregnant 02/05/2020   Thyroid  disease     Patient Active Problem List   Diagnosis Date Noted   Insertion of Implanon  05/09/2020   Postpartum care following cesarean delivery 04/23/2020   Status post repeat low transverse cesarean section 09/18/2019   Family history of breast cancer in mother 01/31/2016   Breast lump in female 11/29/2013    Past Surgical History:  Procedure Laterality Date   BACK SURGERY  2010   cyst removal   CESAREAN SECTION N/A 06/11/2018   Procedure: CESAREAN SECTION;  Surgeon: Fredirick Glenys RAMAN, MD;  Location: [facility] BIR[facility]ING SUITES;  Service: Obstetrics;  Laterality: N/A;   CESAREAN SECTION N/A 03/07/2020   Procedure: CESAREAN SECTION;  Surgeon: Trudy Rozelle DASEN, MD;  Location: [facility] LD ORS;  Service: Obstetrics;  Laterality: N/A;   WRIST SURGERY Left 2011   Cyst    OB History     Gravida  2   Para  2   Term  2   Preterm      AB      Living  2      SAB      IAB      Ectopic      Multiple  0   Live Births  2            Home Medications    Prior to Admission medications   Medication Sig Start Date End Date Taking? Authorizing Provider  acetaminophen  (TYLENOL ) 500 MG  tablet Take 500 mg by mouth every 6 (six) hours as needed for moderate pain. Patient not taking: Reported on 08/09/2020    [provider]  b complex vitamins tablet Take 1 tablet by mouth daily. Patient not taking: Reported on 06/07/2024    [provider]  cetirizine  (ZYRTEC ) 10 MG tablet Take 1 tablet (10 mg total) by mouth daily. Patient not taking: Reported on 10/19/2024 06/14/23   Chandra Harlene LABOR, NP  Iron Combinations (CHROMAGEN) capsule Take 1 capsule by mouth daily. Patient not taking: Reported on 10/19/2024    [provider]  predniSONE  (DELTASONE ) 50 MG tablet One tablet a day for 5 day Patient not taking: Reported on 10/19/2024 06/07/24   Sofia, Leslie K, PA-C  triamcinolone  cream (KENALOG ) 0.1 % Apply 1 Application topically 2 (two) times daily. Patient not taking: Reported on 10/19/2024 08/22/24   Smoot, Lauraine LABOR, PA-C  Vitamin D , Ergocalciferol , (DRISDOL ) 1.25 MG (50000 UNIT) CAPS capsule Take 50,000 Units by mouth every 7 (seven) days. Patient not taking: Reported on 06/07/2024    [provider]    Family History Family History  Problem Relation Age of Onset   Breast cancer Mother    Hypertension Mother    Cancer Maternal Uncle        brain cancer    Hypertension Maternal Grandmother     Social History Social History   Tobacco Use   Smoking status: Former    Current packs/day: 0.00    Types: Cigarettes    Quit date: 06/23/2016    Years since quitting: 8.3   Smokeless tobacco: Never  Vaping Use   Vaping status: Never Used  Substance Use Topics   Alcohol use: Not Currently    Comment: occasional   Drug use: No     Allergies   Patient has no known allergies.   Review of Systems Review of Systems  Constitutional:  Positive for fatigue.  HENT:  Positive for congestion and rhinorrhea.   Respiratory:  Positive for cough.   Cardiovascular: Negative.   Gastrointestinal: Negative.   Musculoskeletal: Negative.   Neurological:   Positive for headaches.  Psychiatric/Behavioral: Negative.       Physical Exam Triage Vital Signs ED Triage Vitals  Encounter Vitals Group     BP 10/19/24 0914 118/66     Girls Systolic BP Percentile --      Girls Diastolic BP Percentile --      Boys Systolic BP Percentile --      Boys Diastolic BP Percentile --      Pulse Rate 10/19/24 0914 81     Resp 10/19/24 0914 16     Temp 10/19/24 0914 98.4 F (36.9 C)     Temp Source 10/19/24 0914 Oral     SpO2 10/19/24 0914 97 %     Weight --      Height --      Head Circumference --      Peak Flow --      Pain Score 10/19/24 0918 7     Pain Loc --      Pain Education --      Exclude from Growth Chart --    No data found.  Updated Vital Signs BP 118/66 (BP Location: Left Arm)   Pulse 81   Temp 98.4 F (36.9 C) (Oral)   Resp 16   LMP 10/04/2024 (Exact Date)   SpO2 97%   Visual Acuity Right Eye Distance:   Left Eye Distance:   Bilateral Distance:    Right Eye Near:   Left Eye Near:    Bilateral Near:     Physical Exam Constitutional:      General: She is not in acute distress.    Appearance: She is well-developed and normal weight. She is not ill-appearing or toxic-appearing.  HENT:     Right Ear: Tympanic membrane normal.     Left Ear: Tympanic membrane normal.     Nose: Congestion and rhinorrhea present.     Mouth/Throat:     Mouth: Mucous membranes are moist.     Pharynx: Posterior oropharyngeal erythema present. No oropharyngeal exudate.     Tonsils: No tonsillar exudate.  Cardiovascular:     Rate and Rhythm: Normal rate and regular rhythm.     Heart sounds: Normal heart sounds.  Pulmonary:     Effort: Pulmonary effort is normal.     Breath sounds: Normal breath sounds. No wheezing or rhonchi.  Musculoskeletal:     Cervical back: Normal range of motion and neck supple.  Lymphadenopathy:     Cervical: No cervical adenopathy.  Neurological:  General: No focal deficit present.     Mental Status: She  is alert.  Psychiatric:        Mood and Affect: Mood normal.      UC Treatments / Results  Labs (all labs ordered are listed, but only abnormal results are displayed) Labs Reviewed  POC COVID19/FLU A&B COMBO    EKG   Radiology No results found.  Procedures Procedures (including critical care time)  Medications Ordered in UC Medications - No data to display  Initial Impression / Assessment and Plan / UC Course  I have reviewed the triage vital signs and the nursing notes.  Pertinent labs & imaging results that were available during my care of the patient were reviewed by me and considered in my medical decision making (see chart for details).     Discharge Instructions      You were seen today for an upper respiratory infection.  Your flu/covid swab was negative.  I have sent out a steroid and cough medication for you symptoms.  Please get rest and fluids.  Please return if not improving as expected.     ED Prescriptions     Medication Sig Dispense Auth. Provider   predniSONE  (DELTASONE ) 20 MG tablet Take 2 tablets (40 mg total) by mouth daily for 5 days. 10 tablet Elouise Divelbiss, MD   benzonatate  (TESSALON ) 200 MG capsule Take 1 capsule (200 mg total) by mouth 3 (three) times daily as needed for cough. 21 capsule Darral Longs, MD      PDMP not reviewed this encounter.   Darral Longs, MD 10/19/24 1004

## 2024-11-08 ENCOUNTER — Ambulatory Visit: Admitting: Physician Assistant

## 2024-11-13 NOTE — BH Specialist Note (Deleted)
 Integrated Behavioral Health via Telemedicine Visit  11/13/2024 CHEYRL BULEY [id number]  Number of Integrated Behavioral Health Clinician visits: No data recorded Session Start time: No data recorded  Session End time: No data recorded Total time in minutes: No data recorded   Referring Provider: *** Patient/Family location: *** [facility] Provider location: *** All persons participating in visit: *** Types of Service: {CHL AMB TYPE OF [phone]}  I connected with Teneshia A Wigley and/or Bernetta A Schloesser's {family members:20773} via  Telephone or Engineer, Civil (consulting)  (Video is Caregility application) and verified that I am speaking with the correct person using two identifiers. Discussed confidentiality: {YES/NO:21197}  I discussed the limitations of telemedicine and the availability of in person appointments.  Discussed there is a possibility of technology failure and discussed alternative modes of communication if that failure occurs.  I discussed that engaging in this telemedicine visit, they consent to the provision of behavioral healthcare and the services will be billed under their insurance.  Patient and/or legal guardian expressed understanding and consented to Telemedicine visit: {YES/NO:21197}  Presenting Concerns: Patient and/or family reports the following symptoms/concerns: *** Duration of problem: ***; Severity of problem: {Mild/Moderate/Severe:20260}  Patient and/or Family's Strengths/Protective Factors: {CHL AMB BH PROTECTIVE FACTORS:[phone]}  Goals Addressed: Patient will:  Reduce symptoms of: {IBH Symptoms:21014056}   Increase knowledge and/or ability of: {IBH Patient Tools:21014057}   Demonstrate ability to: {IBH Goals:21014053}  Progress towards Goals: {CHL AMB BH PROGRESS TOWARDS [phone]}    Interventions: Interventions utilized:  {IBH Interventions:21014054} Standardized Assessments completed: {IBH Screening  Tools:21014051}    Patient and/or Family Response: ***  Clinical Assessment/Diagnosis  No diagnosis found.    Assessment: Patient currently experiencing ***.   Patient may benefit from ***.  Plan: Follow up with behavioral health clinician on : *** Behavioral recommendations: *** Referral(s): {IBH Referrals:21014055}  I discussed the assessment and treatment plan with the patient and/or parent/guardian. They were provided an opportunity to ask questions and all were answered. They agreed with the plan and demonstrated an understanding of the instructions.   They were advised to call back or seek an in-person evaluation if the symptoms worsen or if the condition fails to improve as anticipated.  Zayne Marovich C Kahli Fitzgerald, LCSW

## 2024-11-15 ENCOUNTER — Telehealth: Payer: Self-pay | Admitting: Licensed Clinical Social Worker

## 2024-11-15 NOTE — Telephone Encounter (Signed)
 [facility] contacted patient on this date to schedule [facility] appointment. [facility] left a VM.

## 2024-11-20 ENCOUNTER — Other Ambulatory Visit (HOSPITAL_COMMUNITY)
Admission: RE | Admit: 2024-11-20 | Discharge: 2024-11-20 | Disposition: A | Discharge status: Home / Self Care | Source: Ambulatory Visit | Attending: Obstetrics | Admitting: Obstetrics

## 2024-11-20 ENCOUNTER — Ambulatory Visit (INDEPENDENT_AMBULATORY_CARE_PROVIDER_SITE_OTHER): Admitting: Obstetrics

## 2024-11-20 ENCOUNTER — Encounter: Payer: Self-pay | Admitting: Obstetrics

## 2024-11-20 VITALS — BP 117/81 | HR 80 | Ht 66.0 in | Wt 210.9 lb

## 2024-11-20 DIAGNOSIS — Z01419 Encounter for gynecological examination (general) (routine) without abnormal findings: Secondary | ICD-10-CM | POA: Diagnosis not present

## 2024-11-20 DIAGNOSIS — N898 Other specified noninflammatory disorders of vagina: Secondary | ICD-10-CM | POA: Diagnosis not present

## 2024-11-20 DIAGNOSIS — N939 Abnormal uterine and vaginal bleeding, unspecified: Secondary | ICD-10-CM

## 2024-11-20 DIAGNOSIS — Z113 Encounter for screening for infections with a predominantly sexual mode of transmission: Secondary | ICD-10-CM

## 2024-11-20 DIAGNOSIS — E66811 Obesity, class 1: Secondary | ICD-10-CM | POA: Diagnosis not present

## 2024-11-20 DIAGNOSIS — Z131 Encounter for screening for diabetes mellitus: Secondary | ICD-10-CM

## 2024-11-20 DIAGNOSIS — Z30011 Encounter for initial prescription of contraceptive pills: Secondary | ICD-10-CM

## 2024-11-20 DIAGNOSIS — Z3009 Encounter for other general counseling and advice on contraception: Secondary | ICD-10-CM

## 2024-11-20 MED ORDER — SLYND 4 MG PO TABS: 1.0000 | ORAL_TABLET | Freq: Every day | ORAL | 11 refills | Status: AC

## 2024-11-20 MED ORDER — VITAFOL ULTRA 29-0.6-0.4-200 MG PO CAPS: 1.0000 | ORAL_CAPSULE | Freq: Every day | ORAL | 4 refills | Status: AC

## 2024-11-20 MED ORDER — METRONIDAZOLE 500 MG PO TABS: 500.0000 mg | ORAL_TABLET | Freq: Two times a day (BID) | ORAL | 2 refills | Status: DC

## 2024-11-20 MED ORDER — METRONIDAZOLE 500 MG PO TABS: 500.0000 mg | ORAL_TABLET | Freq: Two times a day (BID) | ORAL | 2 refills | Status: AC

## 2024-11-20 NOTE — Progress Notes (Signed)
 Pt reports weight gain and heavy period with clots and fatigue. Pt was told via weight clinic that she had a high thyroid '.   Last PAP 09/18/2019; high risk HPV  History of breast cancer in mother and sister. Pt reports lumps in both of her breast that have been assessed; last ultrasound 02/11/2024.

## 2024-11-20 NOTE — Progress Notes (Signed)
 Subjective:        Deborah White is a 39 y.o. female here for a routine exam.  Current complaints: Heavy periods with clots..  Malodorous vaginal discharge.  Personal health questionnaire:  Is patient Ashkenazi Jewish, have a family history of breast and/or ovarian cancer: yes Is there a family history of uterine cancer diagnosed at age < 67, gastrointestinal cancer, urinary tract cancer, family member who is a Personnel Officer syndrome-associated carrier: no Is the patient overweight and hypertensive, family history of diabetes, personal history of gestational diabetes, preeclampsia or PCOS: no Is patient over 50, have PCOS,  family history of premature CHD under age 105, diabetes, smoke, have hypertension or peripheral artery disease:  no At any time, has a partner hit, kicked or otherwise hurt or frightened you?: no Over the past 2 weeks, have you felt down, depressed or hopeless?: no Over the past 2 weeks, have you felt little interest or pleasure in doing things?:no   Gynecologic History Patient's last menstrual period was 10/29/2024 (exact date). Contraception: abstinence Last Pap: 2020. Results were: NILM with positive HR-HPV Last mammogram: 08-12-2023. Results were: normal  Obstetric History OB History  Gravida Para Term Preterm AB Living  2 2 2   2   SAB IAB Ectopic Multiple Live Births     0 2    # Outcome Date GA Lbr Len/2nd Weight Sex Type Anes PTL Lv  2 Term 03/07/20 [redacted]w[redacted]d  7 lb 0.9 oz (3.2 kg) F CS-LTranv EPI  LIV  1 Term 06/11/18 [redacted]w[redacted]d  6 lb 9.5 oz (2.99 kg) M CS-LTranv EPI  LIV    Past Medical History:  Diagnosis Date   Anemia    GBS (group B Streptococcus carrier), +RV culture, currently pregnant 02/05/2020   Thyroid  disease     Past Surgical History:  Procedure Laterality Date   BACK SURGERY  2010   cyst removal   CESAREAN SECTION N/A 06/11/2018   Procedure: CESAREAN SECTION;  Surgeon: Fredirick Glenys RAMAN, MD;  Location: [facility] BIRTHING SUITES;  Service: Obstetrics;   Laterality: N/A;   CESAREAN SECTION N/A 03/07/2020   Procedure: CESAREAN SECTION;  Surgeon: Trudy Rozelle DASEN, MD;  Location: [facility] LD ORS;  Service: Obstetrics;  Laterality: N/A;   WRIST SURGERY Left 2011   Cyst    Current Medications[1] Allergies[2]  Social History   Tobacco Use   Smoking status: Former    Current packs/day: 0.00    Types: Cigarettes    Quit date: 06/23/2016    Years since quitting: 8.4   Smokeless tobacco: Never  Substance Use Topics   Alcohol use: Not Currently    Comment: occasional    Family History  Problem Relation Age of Onset   Breast cancer Mother    Hypertension Mother    Cancer Maternal Uncle        brain cancer    Hypertension Maternal Grandmother       Review of Systems  Constitutional: positive for fatigue and weight gain Respiratory: negative for cough and wheezing Cardiovascular: negative for chest pain, fatigue and palpitations Gastrointestinal: negative for abdominal pain and change in bowel habits Musculoskeletal:negative for myalgias Neurological: negative for gait problems and tremors Behavioral/Psych: negative for abusive relationship, depression Endocrine: negative for temperature intolerance    Genitourinary: positive for abnormal menstrual periods, vaginal discharge.   genital lesions, hot flashes, sexual problems  Integument/breast: positive for breast lump, breast tenderness.  Negative for nipple discharge and skin lesion(s)    Objective:  BP 117/81   Pulse 80   Ht 5' 6 (1.676 m)   Wt 210 lb 14.4 oz (95.7 kg)   LMP 10/29/2024 (Exact Date)   BMI 34.04 kg/m  General:   Alert and no distress  Skin:   no rash or abnormalities  Lungs:   clear to auscultation bilaterally  Heart:   regular rate and rhythm, S1, S2 normal, no murmur, click, rub or gallop  Breasts:   normal without suspicious masses, skin or nipple changes or axillary nodes  Abdomen:  normal findings: no organomegaly, soft, non-tender and no hernia   Pelvis:  External genitalia: normal general appearance Urinary system: urethral meatus normal and bladder without fullness, nontender Vaginal: normal without tenderness, induration or masses Cervix: normal appearance Adnexa: normal bimanual exam Uterus: anteverted and non-tender, normal size   Lab Review Urine pregnancy test Labs reviewed yes Radiologic studies reviewed yes  I have spent a total of 20 minutes of face-to-face time, excluding clinical staff time, reviewing notes and preparing to see patient, ordering tests and/or medications, and counseling the patient.   Assessment:    1. Encounter for gynecological examination with Papanicolaou smear of cervix (Primary) RxP - Cytology - PAP( [place]) - VITAMIN D  25 Hydroxy (Vit-D Deficiency, Fractures)  2. Abnormal uterine bleeding (AUB) Rx: - CBC - Comp Met (CMET) - US  PELVIC COMPLETE WITH TRANSVAGINAL; Future - Drospirenone  (SLYND ) 4 MG TABS; Take 1 tablet (4 mg total) by mouth daily before breakfast.  Dispense: 28 tablet; Refill: 11 - Ferritin - Prenat-Fe Poly-Methfol-FA-DHA (VITAFOL  ULTRA) 29-0.6-0.4-200 MG CAPS; Take 1 capsule by mouth daily before breakfast.  Dispense: 90 capsule; Refill: 4  3. Vaginal discharge Rx: - TSH - metroNIDAZOLE  (FLAGYL ) 500 MG tablet; Take 1 tablet (500 mg total) by mouth 2 (two) times daily.  Dispense: 14 tablet; Refill: 2  4. Screen for STD (sexually transmitted disease) Rx:Rx: - Cervicovaginal ancillary only( [place]) - Hepatitis B Surface AntiGEN - Hepatitis C Antibody - HIV antibody (with reflex) - RPR  5. Screening for diabetes mellitus Rx: - Hemoglobin A1c  6. Encounter for other general counseling and advice on contraception - options discussed.  Wants OCP's.  Progestin-only pills recommended because of history of breast lumps and FH of breast CA.  7. Encounter for initial prescription of contraceptive pills Rx: - Drospirenone  (SLYND ) 4 MG TABS; Take 1 tablet (4 mg  total) by mouth daily before breakfast.  Dispense: 28 tablet; Refill: 11  8. Obesity (BMI 30.0-34.9) - weight reduction with the aid of dietary changes, exercise and behavioral modification recommended     Plan:    Education reviewed: calcium supplements, depression evaluation, low fat, low cholesterol diet, safe sex/STD prevention, self breast exams, and weight bearing exercise. Contraception: oral progesterone-only contraceptive. Follow up in: 3 weeks.   Meds ordered this encounter  Medications   Drospirenone  (SLYND ) 4 MG TABS    Sig: Take 1 tablet (4 mg total) by mouth daily before breakfast.    Dispense:  28 tablet    Refill:  11   metroNIDAZOLE  (FLAGYL ) 500 MG tablet    Sig: Take 1 tablet (500 mg total) by mouth 2 (two) times daily.    Dispense:  14 tablet    Refill:  2   Prenat-Fe Poly-Methfol-FA-DHA (VITAFOL  ULTRA) 29-0.6-0.4-200 MG CAPS    Sig: Take 1 capsule by mouth daily before breakfast.    Dispense:  90 capsule    Refill:  4   DISCONTD: metroNIDAZOLE  (FLAGYL ) 500 MG tablet  Sig: Take 1 tablet (500 mg total) by mouth 2 (two) times daily.    Dispense:  14 tablet    Refill:  2   Orders Placed This Encounter  Procedures   US  PELVIC COMPLETE WITH TRANSVAGINAL    Standing Status:   Future    Expiration Date:   11/20/2025    Reason for Exam (SYMPTOM  OR DIAGNOSIS REQUIRED):   Heavy periods.    Preferred imaging location?:   [facility] Ultrasound   Hepatitis B Surface AntiGEN   Hepatitis C Antibody   HIV antibody (with reflex)   RPR   TSH   CBC   Comp Met (CMET)   Hemoglobin A1c   Ferritin   VITAMIN D  25 Hydroxy (Vit-D Deficiency, Fractures)   CARLIN RONAL CENTERS, MD, FACOG Attending Obstetrician & Gynecologist, [facility] for [facility], [facility], [facility] 11/20/2024     [1]  Current Outpatient Medications:    Drospirenone  (SLYND ) 4 MG TABS, Take 1 tablet (4 mg total) by mouth daily before breakfast., Disp: 28 tablet,  Rfl: 11   metroNIDAZOLE  (FLAGYL ) 500 MG tablet, Take 1 tablet (500 mg total) by mouth 2 (two) times daily., Disp: 14 tablet, Rfl: 2   Prenat-Fe Poly-Methfol-FA-DHA (VITAFOL  ULTRA) 29-0.6-0.4-200 MG CAPS, Take 1 capsule by mouth daily before breakfast., Disp: 90 capsule, Rfl: 4   benzonatate  (TESSALON ) 200 MG capsule, Take 1 capsule (200 mg total) by mouth 3 (three) times daily as needed for cough. (Patient not taking: Reported on 11/20/2024), Disp: 21 capsule, Rfl: 0 [2] No Known Allergies

## 2024-11-21 ENCOUNTER — Ambulatory Visit: Payer: Self-pay | Admitting: Obstetrics

## 2024-11-21 DIAGNOSIS — E559 Vitamin D deficiency, unspecified: Secondary | ICD-10-CM

## 2024-11-21 DIAGNOSIS — O99012 Anemia complicating pregnancy, second trimester: Secondary | ICD-10-CM

## 2024-11-21 DIAGNOSIS — D508 Other iron deficiency anemias: Secondary | ICD-10-CM

## 2024-11-21 LAB — COMPREHENSIVE METABOLIC PANEL WITH GFR
ALT: 11 IU/L (ref 0–32)
AST: 14 IU/L (ref 0–40)
Albumin: 4.2 g/dL (ref 3.9–4.9)
Alkaline Phosphatase: 78 IU/L (ref 41–116)
BUN/Creatinine Ratio: 17 (ref 9–23)
BUN: 11 mg/dL (ref 6–20)
Bilirubin Total: 0.2 mg/dL (ref 0.0–1.2)
CO2: 21 mmol/L (ref 20–29)
Calcium: 8.9 mg/dL (ref 8.7–10.2)
Chloride: 104 mmol/L (ref 96–106)
Creatinine, Ser: 0.63 mg/dL (ref 0.57–1.00)
Globulin, Total: 2.5 g/dL (ref 1.5–4.5)
Glucose: 93 mg/dL (ref 70–99)
Potassium: 4 mmol/L (ref 3.5–5.2)
Sodium: 139 mmol/L (ref 134–144)
Total Protein: 6.7 g/dL (ref 6.0–8.5)
eGFR: 116 mL/min/1.73 (ref >59)

## 2024-11-21 LAB — CBC
Hematocrit: 35.1 % (ref 34.0–46.6)
Hemoglobin: 10.9 g/dL — ABNORMAL LOW (ref 11.1–15.9)
MCH: 24.3 pg — ABNORMAL LOW (ref 26.6–33.0)
MCHC: 31.1 g/dL — ABNORMAL LOW (ref 31.5–35.7)
MCV: 78 fL — ABNORMAL LOW (ref 79–97)
Platelets: 227 x10E3/uL (ref 150–450)
RBC: 4.49 x10E6/uL (ref 3.77–5.28)
RDW: 13.2 % (ref 11.7–15.4)
WBC: 6.6 x10E3/uL (ref 3.4–10.8)

## 2024-11-21 LAB — HEPATITIS C ANTIBODY: Hep C Virus Ab: NONREACTIVE

## 2024-11-21 LAB — CERVICOVAGINAL ANCILLARY ONLY
Bacterial Vaginitis (gardnerella): NEGATIVE
Candida Glabrata: NEGATIVE
Candida Vaginitis: NEGATIVE
Chlamydia: NEGATIVE
Comment: NEGATIVE
Comment: NEGATIVE
Comment: NEGATIVE
Comment: NEGATIVE
Comment: NEGATIVE
Comment: NORMAL
Neisseria Gonorrhea: NEGATIVE
Trichomonas: NEGATIVE

## 2024-11-21 LAB — TSH: TSH: 3.04 u[IU]/mL (ref 0.450–4.500)

## 2024-11-21 LAB — HEMOGLOBIN A1C
Est. average glucose Bld gHb Est-mCnc: 111 mg/dL
Hgb A1c MFr Bld: 5.5 % (ref 4.8–5.6)

## 2024-11-21 LAB — SYPHILIS: RPR W/REFLEX TO RPR TITER AND TREPONEMAL ANTIBODIES, TRADITIONAL SCREENING AND DIAGNOSIS ALGORITHM: RPR Ser Ql: NONREACTIVE

## 2024-11-21 LAB — HIV ANTIBODY (ROUTINE TESTING W REFLEX): HIV Screen 4th Generation wRfx: NONREACTIVE

## 2024-11-21 LAB — HEPATITIS B SURFACE ANTIGEN: Hepatitis B Surface Ag: NEGATIVE

## 2024-11-21 MED ORDER — ACCRUFER 30 MG PO CAPS: 1.0000 | ORAL_CAPSULE | Freq: Two times a day (BID) | ORAL | 3 refills | Status: AC

## 2024-11-21 NOTE — Telephone Encounter (Signed)
 LM per DPR, that patient need to start Accrufer  per Dr. Zina.  Erminio DELENA Rumps, RN

## 2024-11-21 NOTE — Telephone Encounter (Signed)
-----   Message from Carlin Centers, MD sent at 11/21/2024  1:54 PM EST ----- Accrufer  Rx for anemia.

## 2024-11-24 LAB — CYTOLOGY - PAP
Comment: NEGATIVE
Diagnosis: NEGATIVE
High risk HPV: NEGATIVE

## 2024-11-28 ENCOUNTER — Ambulatory Visit (HOSPITAL_COMMUNITY)

## 2024-12-02 ENCOUNTER — Ambulatory Visit (HOSPITAL_COMMUNITY)
Admission: RE | Admit: 2024-12-02 | Discharge: 2024-12-02 | Disposition: A | Source: Ambulatory Visit | Attending: Obstetrics | Admitting: Obstetrics

## 2024-12-02 DIAGNOSIS — N939 Abnormal uterine and vaginal bleeding, unspecified: Secondary | ICD-10-CM | POA: Insufficient documentation

## 2024-12-13 LAB — FERRITIN: Ferritin: 10 ng/mL — ABNORMAL LOW (ref 15–150)

## 2024-12-13 LAB — SPECIMEN STATUS REPORT

## 2024-12-13 LAB — VITAMIN D 25 HYDROXY (VIT D DEFICIENCY, FRACTURES): Vit D, 25-Hydroxy: 15.8 ng/mL — ABNORMAL LOW (ref 30.0–100.0)

## 2024-12-13 MED ORDER — VITAMIN D (ERGOCALCIFEROL) 1.25 MG (50000 UNIT) PO CAPS: 50000.0000 [IU] | ORAL_CAPSULE | ORAL | 0 refills | Status: AC

## 2024-12-13 NOTE — Addendum Note (Signed)
 Addended by: RUDY DUNNINGS A on: 12/13/2024 02:49 PM   Modules accepted: Orders

## 2024-12-19 ENCOUNTER — Telehealth: Payer: Self-pay | Admitting: Obstetrics

## 2024-12-19 VITALS — Ht 66.0 in | Wt 210.0 lb

## 2024-12-19 DIAGNOSIS — E559 Vitamin D deficiency, unspecified: Secondary | ICD-10-CM

## 2024-12-19 DIAGNOSIS — D500 Iron deficiency anemia secondary to blood loss (chronic): Secondary | ICD-10-CM

## 2024-12-19 DIAGNOSIS — N939 Abnormal uterine and vaginal bleeding, unspecified: Secondary | ICD-10-CM

## 2024-12-19 MED ORDER — VITAMIN D 50 MCG (2000 UT) PO CAPS: 1.0000 | ORAL_CAPSULE | Freq: Every day | ORAL | 11 refills | Status: AC

## 2024-12-19 NOTE — Progress Notes (Signed)
 "   GYNECOLOGY VIRTUAL VISIT ENCOUNTER NOTE  Provider [facility]   Patient location: Home  I connected with [name] on 12/19/2024 at  1:50 PM EST by MyChart Video Encounter and verified that I am speaking with the correct person using two identifiers.   I discussed the limitations, risks, security and privacy concerns of performing an evaluation and management service virtually and the availability of in person appointments. I also discussed with the patient that there may be a patient responsible charge related to this service. The patient expressed understanding and agreed to proceed.   History:  Deborah White is a 39 y.o. G26P2002 female being evaluated today for AUB and ultrasound results. She denies any abnormal vaginal discharge, bleeding, pelvic pain or other concerns.       Past Medical History:  Diagnosis Date   Anemia    GBS (group B Streptococcus carrier), +RV culture, currently pregnant 02/05/2020   Thyroid  disease    Past Surgical History:  Procedure Laterality Date   BACK SURGERY  2010   cyst removal   CESAREAN SECTION N/A 06/11/2018   Procedure: CESAREAN SECTION;  Surgeon: Fredirick Glenys RAMAN, MD;  Location: [facility] BIRTHING SUITES;  Service: Obstetrics;  Laterality: N/A;   CESAREAN SECTION N/A 03/07/2020   Procedure: CESAREAN SECTION;  Surgeon: Trudy Rozelle DASEN, MD;  Location: [facility] LD ORS;  Service: Obstetrics;  Laterality: N/A;   WRIST SURGERY Left 2011   Cyst   The following portions of the patient's history were reviewed and updated as appropriate: allergies, current medications, past family history, past medical history, past social history, past surgical history and problem list.   Health Maintenance:  Normal pap and negative HRHPV on 11/20/2024.   Review of Systems:  Pertinent items noted in HPI and remainder of comprehensive ROS otherwise negative.  Physical Exam:   General:  Alert, oriented and cooperative. Patient  appears to be in no acute distress.  Mental Status: Normal mood and affect. Normal behavior. Normal judgment and thought content.   Respiratory: Normal respiratory effort, no problems with respiration noted  Rest of physical exam deferred due to type of encounter  Labs and Imaging No results found for this or any previous visit (from the past 2 weeks). US  PELVIC COMPLETE WITH TRANSVAGINAL Result Date: 12/10/2024 CLINICAL DATA:  Abnormal uterine bleeding EXAM: TRANSABDOMINAL AND TRANSVAGINAL ULTRASOUND OF PELVIS DOPPLER ULTRASOUND OF OVARIES TECHNIQUE: Both transabdominal and transvaginal ultrasound examinations of the pelvis were performed. Transabdominal technique was performed for global imaging of the pelvis including uterus, ovaries, adnexal regions, and pelvic cul-de-sac. It was necessary to proceed with endovaginal exam following the transabdominal exam to visualize the uterus and adnexae in better detail. Color and duplex Doppler ultrasound was utilized to evaluate blood flow to the ovaries. COMPARISON:  None Available. FINDINGS: Uterus Measurements: 10.4 x 5.1 x 6.0 cm = volume: 184 mL. Anterior left uterine fundus intramural fibroid measures 2.7 x 2.1 x 2.6 cm. Endometrium Thickness: 11.8 mm.  No focal abnormality visualized. Right ovary Measurements: 3.4 x 2.2 x 4.1 cm = volume:  16.5 mL. Normal appearance/no adnexal mass. Doppler: There is normal vascularity on color doppler examination. Spectral doppler arterial and venous waveforms are normal. Left ovary Measurements: 3.8 x 1.9 x 2.3 cm = volume: 8.5 mL. Normal appearance/no adnexal mass. Doppler: There is normal vascularity on color doppler examination. Spectral doppler arterial and venous waveforms are normal. Other findings No abnormal free fluid. IMPRESSION: 1. 2.7 cm  anterior left uterine fundus intramural fibroid. 2. No other acute or significant finding by ultrasound Electronically Signed   By: CHRISTELLA.  Shick M.D.   On: 12/10/2024 17:05        Assessment and Plan:     1. Abnormal uterine bleeding (AUB) (Primary) - improved on Slynd  - continue Slynd   2. Vitamin D  deficiency Rx: - Cholecalciferol (VITAMIN D ) 50 MCG (2000 UT) CAPS; Take 1 capsule (2,000 Units total) by mouth daily before breakfast. Start taking after the 5 week course of high dose Vitamin D .  Dispense: 30 capsule; Refill: 11  3. Iron deficiency anemia due to chronic blood loss - taking Accrufer  and a PNV       I discussed the assessment and treatment plan with the patient. The patient was provided an opportunity to ask questions and all were answered. The patient agreed with the plan and demonstrated an understanding of the instructions.   The patient was advised to call back or seek an in-person evaluation/go to the ED if the symptoms worsen or if the condition fails to improve as anticipated.  I provided 10 minutes of face-to-face time during this encounter. I also spent 10 minutes dedicated to the care of this patient including pre-visit review of records, post visit ordering of medications and appropriate tests or procedures, coordinating care and documenting this visit encounter.    Carlin Centers, MD, [facility] for [facility], [facility], [place] 12/19/2024 "

## 2024-12-19 NOTE — Progress Notes (Signed)
 Pt presents for mychart f/u. Pt has no questions or concerns at this time.
